# Patient Record
Sex: Female | Born: 1975 | Race: White | Hispanic: No | Smoking: Current every day smoker
Health system: Southern US, Community
[De-identification: ages and names within clinical notes are randomized; demographics above are authoritative.]

## PROBLEM LIST (undated history)

## (undated) DIAGNOSIS — G43909 Migraine, unspecified, not intractable, without status migrainosus: Secondary | ICD-10-CM

## (undated) DIAGNOSIS — E785 Hyperlipidemia, unspecified: Secondary | ICD-10-CM

## (undated) DIAGNOSIS — T8859XA Other complications of anesthesia, initial encounter: Secondary | ICD-10-CM

## (undated) DIAGNOSIS — Z8619 Personal history of other infectious and parasitic diseases: Secondary | ICD-10-CM

## (undated) DIAGNOSIS — T7840XA Allergy, unspecified, initial encounter: Secondary | ICD-10-CM

## (undated) DIAGNOSIS — F32A Depression, unspecified: Secondary | ICD-10-CM

## (undated) DIAGNOSIS — R87619 Unspecified abnormal cytological findings in specimens from cervix uteri: Secondary | ICD-10-CM

## (undated) DIAGNOSIS — Z9889 Other specified postprocedural states: Secondary | ICD-10-CM

## (undated) DIAGNOSIS — Z9289 Personal history of other medical treatment: Secondary | ICD-10-CM

## (undated) DIAGNOSIS — IMO0002 Reserved for concepts with insufficient information to code with codable children: Secondary | ICD-10-CM

## (undated) DIAGNOSIS — F419 Anxiety disorder, unspecified: Secondary | ICD-10-CM

## (undated) DIAGNOSIS — R112 Nausea with vomiting, unspecified: Secondary | ICD-10-CM

## (undated) DIAGNOSIS — R7611 Nonspecific reaction to tuberculin skin test without active tuberculosis: Secondary | ICD-10-CM

## (undated) HISTORY — DX: Personal history of other medical treatment: Z92.89

## (undated) HISTORY — DX: Hyperlipidemia, unspecified: E78.5

## (undated) HISTORY — PX: COLPOSCOPY: SHX161

## (undated) HISTORY — DX: Migraine, unspecified, not intractable, without status migrainosus: G43.909

## (undated) HISTORY — PX: TONSILLECTOMY: SUR1361

## (undated) HISTORY — DX: Allergy, unspecified, initial encounter: T78.40XA

## (undated) HISTORY — DX: Nonspecific reaction to tuberculin skin test without active tuberculosis: R76.11

## (undated) HISTORY — PX: WISDOM TOOTH EXTRACTION: SHX21

## (undated) HISTORY — DX: Personal history of other infectious and parasitic diseases: Z86.19

---
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
## 1998-10-31 HISTORY — PX: COLPOSCOPY: SHX161

## 2000-07-13 HISTORY — PX: CHOLECYSTECTOMY, LAPAROSCOPIC: SHX56

## 2002-10-31 DIAGNOSIS — R7611 Nonspecific reaction to tuberculin skin test without active tuberculosis: Secondary | ICD-10-CM

## 2002-10-31 HISTORY — DX: Nonspecific reaction to tuberculin skin test without active tuberculosis: R76.11

## 2010-09-07 ENCOUNTER — Ambulatory Visit: Payer: Self-pay | Admitting: Surgery

## 2010-09-07 ENCOUNTER — Emergency Department (HOSPITAL_COMMUNITY): Admission: EM | Admit: 2010-09-07 | Discharge: 2010-09-07 | Payer: Self-pay | Admitting: Emergency Medicine

## 2010-09-07 ENCOUNTER — Encounter (INDEPENDENT_AMBULATORY_CARE_PROVIDER_SITE_OTHER): Payer: Self-pay | Admitting: Emergency Medicine

## 2010-10-31 NOTE — L&D Delivery Note (Signed)
Delivery Note At 0235, pt's ctxs about every 5-7 minutes, but feeling more intense and more pressure.  Both her sisters were at bedside, along w/ her husband, and pt agreed to cx exam to see if should call her doula to return to hospital w/ birthing tub.  At that time, cx=3.5/100/0.  Secondary to pt's change in discomfort, doula called at that time to return to hospital w/ tub  Pt's labor continued to progress spontaneously thereafter.  FHT was WNL both when intermittently monitored, and NST's.  Pt's RN called around 0330 to report doula filling tub in preparation for pt to get in, and ok'd for pt to proceed to tub, but instructed RN to not let her stay in longer than 1 hr at a time.  At 42, pt's RN called and I was in MAU and stated, "pt feeling urge to push."  While assessing a pt in MAU, RN called back at 0432 and said, "she's crowning."  When arriving in room, pt on hands-and-knees in tub, and w/ next contraction, head crowned and delivered.  Pushed w/ 1 more ctx and body delivered and pt brought newborn to surface and to her chest.   At 4:35 AM a viable female "Ivin Booty" was delivered via Vaginal, Spontaneous Delivery (Presentation: Left Occiput Anterior).  APGAR: 8, 8; weight 7 lb 12.3 oz (3525 g).   Placenta status: Intact, Spontaneous, trailing membranes, Duncan.  Cord: 3 vessels with the following complications: None.  Cord pH: n/a.  Cord was doubly clamped (while newborn and pt still in tub) and cut by her sister to better assess newborn's temperature and color at warmer.  Pt was then helped out of tub and back to bed for placenta delivery and repair.  Anesthesia: Local~10cc  Episiotomy: None Lacerations: 2nd degree Suture Repair: 3.0 monocryl Est. Blood Loss (mL): 300  Mom to postpartum.  Baby to nursery-stable. Planning inpatient circ and breastfeed.   Danielle Cook 10/31/2011, 6:11 AM

## 2011-04-05 LAB — HEPATITIS B SURFACE ANTIGEN: Hepatitis B Surface Ag: NEGATIVE

## 2011-04-05 LAB — ABO/RH: RH Type: POSITIVE

## 2011-04-05 LAB — RPR: RPR: NONREACTIVE

## 2011-10-30 ENCOUNTER — Encounter (HOSPITAL_COMMUNITY): Payer: Self-pay | Admitting: *Deleted

## 2011-10-30 ENCOUNTER — Inpatient Hospital Stay (HOSPITAL_COMMUNITY)
Admission: AD | Admit: 2011-10-30 | Discharge: 2011-11-01 | DRG: 372 | Disposition: A | Discharge status: Home / Self Care | Payer: BC Managed Care – PPO | Source: Ambulatory Visit | Attending: Obstetrics and Gynecology | Admitting: Obstetrics and Gynecology

## 2011-10-30 DIAGNOSIS — Z22330 Carrier of Group B streptococcus: Secondary | ICD-10-CM

## 2011-10-30 DIAGNOSIS — O99892 Other specified diseases and conditions complicating childbirth: Secondary | ICD-10-CM | POA: Diagnosis present

## 2011-10-30 DIAGNOSIS — O09529 Supervision of elderly multigravida, unspecified trimester: Secondary | ICD-10-CM | POA: Diagnosis present

## 2011-10-30 DIAGNOSIS — O4290 Premature rupture of membranes, unspecified as to length of time between rupture and onset of labor, unspecified weeks of gestation: Principal | ICD-10-CM | POA: Diagnosis present

## 2011-10-30 DIAGNOSIS — IMO0002 Reserved for concepts with insufficient information to code with codable children: Secondary | ICD-10-CM | POA: Diagnosis present

## 2011-10-30 DIAGNOSIS — O99820 Streptococcus B carrier state complicating pregnancy: Secondary | ICD-10-CM

## 2011-10-30 DIAGNOSIS — F418 Other specified anxiety disorders: Secondary | ICD-10-CM | POA: Diagnosis not present

## 2011-10-30 HISTORY — DX: Reserved for concepts with insufficient information to code with codable children: IMO0002

## 2011-10-30 HISTORY — DX: Unspecified abnormal cytological findings in specimens from cervix uteri: R87.619

## 2011-10-30 LAB — COMPREHENSIVE METABOLIC PANEL
Albumin: 2.6 g/dL — ABNORMAL LOW (ref 3.5–5.2)
BUN: 8 mg/dL (ref 6–23)
Calcium: 9.1 mg/dL (ref 8.4–10.5)
Chloride: 103 mEq/L (ref 96–112)
Creatinine, Ser: 0.51 mg/dL (ref 0.50–1.10)
GFR calc non Af Amer: >90 mL/min (ref >90)
Total Bilirubin: 0.2 mg/dL — ABNORMAL LOW (ref 0.3–1.2)

## 2011-10-30 LAB — CBC
Hemoglobin: 13.9 g/dL (ref 12.0–15.0)
MCHC: 34.4 g/dL (ref 30.0–36.0)
RBC: 4.43 MIL/uL (ref 3.87–5.11)
WBC: 10.7 10*3/uL — ABNORMAL HIGH (ref 4.0–10.5)

## 2011-10-30 LAB — LACTATE DEHYDROGENASE: LDH: 158 U/L (ref 94–250)

## 2011-10-30 LAB — URIC ACID: Uric Acid, Serum: 4.9 mg/dL (ref 2.4–7.0)

## 2011-10-30 MED ORDER — SODIUM CHLORIDE 0.9 % IV SOLN: 250.0000 mL | INTRAVENOUS | Status: DC | PRN

## 2011-10-30 MED ORDER — HYDROXYZINE HCL 50 MG PO TABS: 50.0000 mg | ORAL_TABLET | Freq: Four times a day (QID) | ORAL | Status: DC | PRN

## 2011-10-30 MED ORDER — PENICILLIN G POTASSIUM 5000000 UNITS IJ SOLR
5.0000 10*6.[IU] | Freq: Once | INTRAVENOUS | Status: AC
  Administered 2011-10-30: 5 10*6.[IU] via INTRAVENOUS
  Filled 2011-10-30: qty 5

## 2011-10-30 MED ORDER — ACETAMINOPHEN 325 MG PO TABS: 650.0000 mg | ORAL_TABLET | ORAL | Status: DC | PRN

## 2011-10-30 MED ORDER — CITRIC ACID-SODIUM CITRATE 334-500 MG/5ML PO SOLN: 30.0000 mL | ORAL | Status: DC | PRN

## 2011-10-30 MED ORDER — SODIUM CHLORIDE 0.9 % IJ SOLN: 3.0000 mL | INTRAMUSCULAR | Status: DC | PRN

## 2011-10-30 MED ORDER — ZOLPIDEM TARTRATE 10 MG PO TABS: 10.0000 mg | ORAL_TABLET | Freq: Every evening | ORAL | Status: DC | PRN

## 2011-10-30 MED ORDER — SODIUM CHLORIDE 0.9 % IJ SOLN: 3.0000 mL | Freq: Two times a day (BID) | INTRAMUSCULAR | Status: DC

## 2011-10-30 MED ORDER — OXYTOCIN BOLUS FROM INFUSION
500.0000 mL | Freq: Once | INTRAVENOUS | Status: DC
  Filled 2011-10-30: qty 500

## 2011-10-30 MED ORDER — LIDOCAINE HCL (PF) 1 % IJ SOLN
30.0000 mL | INTRAMUSCULAR | Status: DC | PRN
  Administered 2011-10-31: 30 mL via SUBCUTANEOUS
  Filled 2011-10-30: qty 30

## 2011-10-30 MED ORDER — PENICILLIN G POTASSIUM 5000000 UNITS IJ SOLR
2.5000 10*6.[IU] | INTRAMUSCULAR | Status: DC
  Administered 2011-10-31: 2.5 10*6.[IU] via INTRAVENOUS
  Filled 2011-10-30 (×5): qty 2.5

## 2011-10-30 MED ORDER — HYDROXYZINE HCL 50 MG/ML IM SOLN: 50.0000 mg | Freq: Four times a day (QID) | INTRAMUSCULAR | Status: DC | PRN

## 2011-10-30 MED ORDER — IBUPROFEN 600 MG PO TABS
600.0000 mg | ORAL_TABLET | Freq: Four times a day (QID) | ORAL | Status: DC | PRN
  Administered 2011-10-31: 600 mg via ORAL
  Filled 2011-10-30 (×3): qty 1

## 2011-10-30 MED ORDER — FLEET ENEMA 7-19 GM/118ML RE ENEM: 1.0000 | ENEMA | RECTAL | Status: DC | PRN

## 2011-10-30 MED ORDER — OXYCODONE-ACETAMINOPHEN 5-325 MG PO TABS: 2.0000 | ORAL_TABLET | ORAL | Status: DC | PRN

## 2011-10-30 MED ORDER — OXYTOCIN 20 UNITS IN LACTATED RINGERS INFUSION - SIMPLE
125.0000 mL/h | Freq: Once | INTRAVENOUS | Status: DC
  Filled 2011-10-30: qty 1000

## 2011-10-30 MED ORDER — BUTORPHANOL TARTRATE 2 MG/ML IJ SOLN: 1.0000 mg | INTRAMUSCULAR | Status: DC | PRN

## 2011-10-30 MED ORDER — ONDANSETRON HCL 4 MG/2ML IJ SOLN: 4.0000 mg | Freq: Four times a day (QID) | INTRAMUSCULAR | Status: DC | PRN

## 2011-10-30 MED ORDER — LACTATED RINGERS IV SOLN: 500.0000 mL | INTRAVENOUS | Status: DC | PRN
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
## 2011-10-30 NOTE — Progress Notes (Signed)
Pt states her water broke at 1800-note leaking clear fluid-H.Steelman,CNM in and evaulated fluid leaking-to admit

## 2011-10-31 ENCOUNTER — Encounter (HOSPITAL_COMMUNITY): Payer: Self-pay | Admitting: Obstetrics

## 2011-10-31 DIAGNOSIS — IMO0002 Reserved for concepts with insufficient information to code with codable children: Secondary | ICD-10-CM | POA: Diagnosis present

## 2011-10-31 DIAGNOSIS — O09529 Supervision of elderly multigravida, unspecified trimester: Secondary | ICD-10-CM | POA: Insufficient documentation

## 2011-10-31 DIAGNOSIS — O99820 Streptococcus B carrier state complicating pregnancy: Secondary | ICD-10-CM

## 2011-10-31 DIAGNOSIS — F418 Other specified anxiety disorders: Secondary | ICD-10-CM | POA: Diagnosis not present

## 2011-10-31 LAB — RPR: RPR Ser Ql: NONREACTIVE

## 2011-10-31 MED ORDER — IBUPROFEN 600 MG PO TABS
600.0000 mg | ORAL_TABLET | Freq: Four times a day (QID) | ORAL | Status: DC
  Administered 2011-10-31 – 2011-11-01 (×3): 600 mg via ORAL
  Filled 2011-10-31 (×2): qty 1

## 2011-10-31 MED ORDER — ESCITALOPRAM OXALATE 10 MG PO TABS
5.0000 mg | ORAL_TABLET | Freq: Every day | ORAL | Status: DC
  Administered 2011-10-31: 5 mg via ORAL
  Filled 2011-10-31 (×2): qty 0.5

## 2011-10-31 MED ORDER — OXYCODONE-ACETAMINOPHEN 5-325 MG PO TABS
1.0000 | ORAL_TABLET | ORAL | Status: DC | PRN
  Filled 2011-10-31: qty 1

## 2011-10-31 MED ORDER — SENNOSIDES-DOCUSATE SODIUM 8.6-50 MG PO TABS: 2.0000 | ORAL_TABLET | Freq: Every day | ORAL | Status: DC

## 2011-10-31 MED ORDER — MEDROXYPROGESTERONE ACETATE 150 MG/ML IM SUSP: 150.0000 mg | INTRAMUSCULAR | Status: DC | PRN

## 2011-10-31 MED ORDER — ONDANSETRON HCL 4 MG PO TABS: 4.0000 mg | ORAL_TABLET | ORAL | Status: DC | PRN

## 2011-10-31 MED ORDER — ZOLPIDEM TARTRATE 5 MG PO TABS: 5.0000 mg | ORAL_TABLET | Freq: Every evening | ORAL | Status: DC | PRN

## 2011-10-31 MED ORDER — WITCH HAZEL-GLYCERIN EX PADS: 1.0000 "application " | MEDICATED_PAD | CUTANEOUS | Status: DC | PRN

## 2011-10-31 MED ORDER — TETANUS-DIPHTH-ACELL PERTUSSIS 5-2.5-18.5 LF-MCG/0.5 IM SUSP: 0.5000 mL | Freq: Once | INTRAMUSCULAR | Status: DC

## 2011-10-31 MED ORDER — PRENATAL MULTIVITAMIN CH: 1.0000 | ORAL_TABLET | Freq: Every day | ORAL | Status: DC

## 2011-10-31 MED ORDER — DIPHENHYDRAMINE HCL 25 MG PO CAPS: 25.0000 mg | ORAL_CAPSULE | Freq: Four times a day (QID) | ORAL | Status: DC | PRN

## 2011-10-31 MED ORDER — LORATADINE 10 MG PO TABS
10.0000 mg | ORAL_TABLET | Freq: Every day | ORAL | Status: DC
  Administered 2011-10-31: 10 mg via ORAL
  Filled 2011-10-31 (×2): qty 1

## 2011-10-31 MED ORDER — ONDANSETRON HCL 4 MG/2ML IJ SOLN: 4.0000 mg | INTRAMUSCULAR | Status: DC | PRN

## 2011-10-31 MED ORDER — BENZOCAINE-MENTHOL 20-0.5 % EX AERO: 1.0000 "application " | INHALATION_SPRAY | CUTANEOUS | Status: DC | PRN

## 2011-10-31 MED ORDER — METHYLERGONOVINE MALEATE 0.2 MG/ML IJ SOLN: 0.2000 mg | INTRAMUSCULAR | Status: DC | PRN

## 2011-10-31 MED ORDER — PRENATAL MULTIVITAMIN CH
1.0000 | ORAL_TABLET | Freq: Every day | ORAL | Status: DC
  Administered 2011-10-31: 1 via ORAL
  Filled 2011-10-31: qty 1

## 2011-10-31 MED ORDER — DIBUCAINE 1 % RE OINT: 1.0000 "application " | TOPICAL_OINTMENT | RECTAL | Status: DC | PRN

## 2011-10-31 MED ORDER — LANOLIN HYDROUS EX OINT: TOPICAL_OINTMENT | CUTANEOUS | Status: DC | PRN

## 2011-10-31 MED ORDER — METHYLERGONOVINE MALEATE 0.2 MG PO TABS: 0.2000 mg | ORAL_TABLET | ORAL | Status: DC | PRN

## 2011-10-31 MED ORDER — SIMETHICONE 80 MG PO CHEW: 80.0000 mg | CHEWABLE_TABLET | ORAL | Status: DC | PRN

## 2011-10-31 MED ORDER — MAGNESIUM HYDROXIDE 400 MG/5ML PO SUSP: 30.0000 mL | ORAL | Status: DC | PRN

## 2011-11-01 LAB — CBC
HCT: 34.1 % — ABNORMAL LOW (ref 36.0–46.0)
Hemoglobin: 11.4 g/dL — ABNORMAL LOW (ref 12.0–15.0)
MCH: 30.8 pg (ref 26.0–34.0)
MCHC: 33.4 g/dL (ref 30.0–36.0)

## 2011-11-01 MED ORDER — IBUPROFEN 600 MG PO TABS: 600.0000 mg | ORAL_TABLET | Freq: Four times a day (QID) | ORAL | Status: AC | PRN

## 2011-11-01 NOTE — Discharge Summary (Signed)
Physician Discharge Summary  Patient ID: Danielle Cook MRN: [id number] DOB/AGE: 1976-01-28 35 y.o.  Admit date: 10/30/2011 Discharge date: 11/01/2011  Admission Diagnoses: term pg labor  Discharge Diagnoses:  Active Problems:  NSVD (normal spontaneous vaginal delivery)  Anxiety   Discharged Condition: stable  Hospital Course: presented in labor, SVD, normal involution HGB 11.4 Consults: none  Significant Diagnostic Studies: labs: S: breastfeeding going well, plan husband vasectomy Discharge Exam: Blood pressure 112/74, pulse 88, temperature 98.3 F (36.8 C), temperature source Oral, resp. rate 18, height 5\' 4"  (1.626 m), weight 223 lb (101.152 kg), SpO2 98.00%, unknown if currently breastfeeding. P abd soft, gravid, nt, ff sm serosa flow, perineum clean intact, perineal lac well approximated nonreactive, -Homans sign bilaterally, +1 edema lower legs  Disposition: Final discharge disposition not confirmed  Discharge Orders    Future Orders Please Complete By Expires   Strep B DNA probe      Comments:   This external order was created through the Results Console.   HIV antibody      Comments:   This external order was created through the Results Console.   Rubella antibody, IgM      Comments:   This external order was created through the Results Console.   Hepatitis B surface antigen      Comments:   This external order was created through the Results Console.   RPR      Comments:   This external order was created through the Results Console.   Antibody screen      Comments:   This external order was created through the Results Console.   ABO/Rh      Comments:   This external order was created through the Results Console.     Medication List  As of 11/01/2011  9:10 AM   START taking these medications         ibuprofen 600 MG tablet   Commonly known as: ADVIL,MOTRIN   Take 1 tablet (600 mg total) by mouth every 6 (six) hours as needed for pain (pain scale <  4).         CONTINUE taking these medications         escitalopram 5 MG tablet   Commonly known as: LEXAPRO      loratadine 10 MG tablet   Commonly known as: CLARITIN      omeprazole 20 MG capsule   Commonly known as: PRILOSEC      prenatal multivitamin Tabs         STOP taking these medications         EVENING PRIMROSE OIL PO          Where to get your medications    These are the prescriptions that you need to pick up.   You may get these medications from any pharmacy.         ibuprofen 600 MG tablet           Follow-up Information    Follow up with [facility] in 6 weeks.         SignedLavera Guise 11/01/2011, 9:10 AM

## 2011-11-08 ENCOUNTER — Inpatient Hospital Stay (HOSPITAL_COMMUNITY)
Admission: AD | Admit: 2011-11-08 | Payer: BC Managed Care – PPO | Source: Ambulatory Visit | Admitting: Obstetrics and Gynecology

## 2013-03-22 ENCOUNTER — Ambulatory Visit (INDEPENDENT_AMBULATORY_CARE_PROVIDER_SITE_OTHER): Payer: BC Managed Care – PPO | Admitting: Internal Medicine

## 2013-03-22 ENCOUNTER — Other Ambulatory Visit (INDEPENDENT_AMBULATORY_CARE_PROVIDER_SITE_OTHER): Payer: BC Managed Care – PPO

## 2013-03-22 ENCOUNTER — Encounter: Payer: Self-pay | Admitting: Internal Medicine

## 2013-03-22 ENCOUNTER — Ambulatory Visit (INDEPENDENT_AMBULATORY_CARE_PROVIDER_SITE_OTHER)
Admission: RE | Admit: 2013-03-22 | Discharge: 2013-03-22 | Disposition: A | Discharge status: Home / Self Care | Payer: BC Managed Care – PPO | Source: Ambulatory Visit | Attending: Internal Medicine | Admitting: Internal Medicine

## 2013-03-22 VITALS — BP 102/70 | HR 72 | Temp 98.5°F | Resp 16 | Ht 63.75 in | Wt 193.4 lb

## 2013-03-22 DIAGNOSIS — Z0000 Encounter for general adult medical examination without abnormal findings: Secondary | ICD-10-CM

## 2013-03-22 DIAGNOSIS — J309 Allergic rhinitis, unspecified: Secondary | ICD-10-CM | POA: Insufficient documentation

## 2013-03-22 DIAGNOSIS — R7611 Nonspecific reaction to tuberculin skin test without active tuberculosis: Secondary | ICD-10-CM | POA: Insufficient documentation

## 2013-03-22 DIAGNOSIS — F419 Anxiety disorder, unspecified: Secondary | ICD-10-CM

## 2013-03-22 LAB — CBC WITH DIFFERENTIAL/PLATELET
Basophils Relative: 1 % (ref 0.0–3.0)
Eosinophils Absolute: 0.4 10*3/uL (ref 0.0–0.7)
Eosinophils Relative: 6.1 % — ABNORMAL HIGH (ref 0.0–5.0)
Lymphocytes Relative: 31.4 % (ref 12.0–46.0)
Monocytes Relative: 6.7 % (ref 3.0–12.0)
Neutrophils Relative %: 54.8 % (ref 43.0–77.0)
RBC: 4.58 Mil/uL (ref 3.87–5.11)
WBC: 6.7 10*3/uL (ref 4.5–10.5)

## 2013-03-22 LAB — URINALYSIS, ROUTINE W REFLEX MICROSCOPIC
Bilirubin Urine: NEGATIVE
Nitrite: NEGATIVE
Total Protein, Urine: NEGATIVE

## 2013-03-22 LAB — COMPREHENSIVE METABOLIC PANEL
AST: 22 U/L (ref 0–37)
Albumin: 3.8 g/dL (ref 3.5–5.2)
BUN: 10 mg/dL (ref 6–23)
CO2: 29 mEq/L (ref 19–32)
Calcium: 9 mg/dL (ref 8.4–10.5)
Chloride: 105 mEq/L (ref 96–112)
Glucose, Bld: 83 mg/dL (ref 70–99)
Potassium: 4.1 mEq/L (ref 3.5–5.1)

## 2013-03-22 LAB — LIPID PANEL
LDL Cholesterol: 118 mg/dL — ABNORMAL HIGH (ref 0–99)
Total CHOL/HDL Ratio: 4
VLDL: 17.8 mg/dL (ref 0.0–40.0)

## 2013-03-22 MED ORDER — ESCITALOPRAM OXALATE 5 MG PO TABS: 5.0000 mg | ORAL_TABLET | Freq: Every day | ORAL | Status: DC

## 2013-03-22 NOTE — Patient Instructions (Signed)
Preventive Care for Adults, Female A healthy lifestyle and preventive care can promote health and wellness. Preventive health guidelines for women include the following key practices.  A routine yearly physical is a good way to check with your caregiver about your health and preventive screening. It is a chance to share any concerns and updates on your health, and to receive a thorough exam.  Visit your dentist for a routine exam and preventive care every 6 months. Brush your teeth twice a day and floss once a day. Good oral hygiene prevents tooth decay and gum disease.  The frequency of eye exams is based on your age, health, family medical history, use of contact lenses, and other factors. Follow your caregiver's recommendations for frequency of eye exams.  Eat a healthy diet. Foods like vegetables, fruits, whole grains, low-fat dairy products, and lean protein foods contain the nutrients you need without too many calories. Decrease your intake of foods high in solid fats, added sugars, and salt. Eat the right amount of calories for you.Get information about a proper diet from your caregiver, if necessary.  Regular physical exercise is one of the most important things you can do for your health. Most adults should get at least 150 minutes of moderate-intensity exercise (any activity that increases your heart rate and causes you to sweat) each week. In addition, most adults need muscle-strengthening exercises on 2 or more days a week.  Maintain a healthy weight. The body mass index (BMI) is a screening tool to identify possible weight problems. It provides an estimate of body fat based on height and weight. Your caregiver can help determine your BMI, and can help you achieve or maintain a healthy weight.For adults 20 years and older:  A BMI below 18.5 is considered underweight.  A BMI of 18.5 to 24.9 is normal.  A BMI of 25 to 29.9 is considered overweight.  A BMI of 30 and above is  considered obese.  Maintain normal blood lipids and cholesterol levels by exercising and minimizing your intake of saturated fat. Eat a balanced diet with plenty of fruit and vegetables. Blood tests for lipids and cholesterol should begin at age 20 and be repeated every 5 years. If your lipid or cholesterol levels are high, you are over 50, or you are at high risk for heart disease, you may need your cholesterol levels checked more frequently.Ongoing high lipid and cholesterol levels should be treated with medicines if diet and exercise are not effective.  If you smoke, find out from your caregiver how to quit. If you do not use tobacco, do not start.  If you are pregnant, do not drink alcohol. If you are breastfeeding, be very cautious about drinking alcohol. If you are not pregnant and choose to drink alcohol, do not exceed 1 drink per day. One drink is considered to be 12 ounces (355 mL) of beer, 5 ounces (148 mL) of wine, or 1.5 ounces (44 mL) of liquor.  Avoid use of street drugs. Do not share needles with anyone. Ask for help if you need support or instructions about stopping the use of drugs.  High blood pressure causes heart disease and increases the risk of stroke. Your blood pressure should be checked at least every 1 to 2 years. Ongoing high blood pressure should be treated with medicines if weight loss and exercise are not effective.  If you are 55 to 37 years old, ask your caregiver if you should take aspirin to prevent strokes.  Diabetes   screening involves taking a blood sample to check your fasting blood sugar level. This should be done once every 3 years, after age 45, if you are within normal weight and without risk factors for diabetes. Testing should be considered at a younger age or be carried out more frequently if you are overweight and have at least 1 risk factor for diabetes.  Breast cancer screening is essential preventive care for women. You should practice "breast  self-awareness." This means understanding the normal appearance and feel of your breasts and may include breast self-examination. Any changes detected, no matter how small, should be reported to a caregiver. Women in their 20s and 30s should have a clinical breast exam (CBE) by a caregiver as part of a regular health exam every 1 to 3 years. After age 40, women should have a CBE every year. Starting at age 40, women should consider having a mammography (breast X-ray test) every year. Women who have a family history of breast cancer should talk to their caregiver about genetic screening. Women at a high risk of breast cancer should talk to their caregivers about having magnetic resonance imaging (MRI) and a mammography every year.  The Pap test is a screening test for cervical cancer. A Pap test can show cell changes on the cervix that might become cervical cancer if left untreated. A Pap test is a procedure in which cells are obtained and examined from the lower end of the uterus (cervix).  Women should have a Pap test starting at age 21.  Between ages 21 and 29, Pap tests should be repeated every 2 years.  Beginning at age 30, you should have a Pap test every 3 years as long as the past 3 Pap tests have been normal.  Some women have medical problems that increase the chance of getting cervical cancer. Talk to your caregiver about these problems. It is especially important to talk to your caregiver if a new problem develops soon after your last Pap test. In these cases, your caregiver may recommend more frequent screening and Pap tests.  The above recommendations are the same for women who have or have not gotten the vaccine for human papillomavirus (HPV).  If you had a hysterectomy for a problem that was not cancer or a condition that could lead to cancer, then you no longer need Pap tests. Even if you no longer need a Pap test, a regular exam is a good idea to make sure no other problems are  starting.  If you are between ages 65 and 70, and you have had normal Pap tests going back 10 years, you no longer need Pap tests. Even if you no longer need a Pap test, a regular exam is a good idea to make sure no other problems are starting.  If you have had past treatment for cervical cancer or a condition that could lead to cancer, you need Pap tests and screening for cancer for at least 20 years after your treatment.  If Pap tests have been discontinued, risk factors (such as a new sexual partner) need to be reassessed to determine if screening should be resumed.  The HPV test is an additional test that may be used for cervical cancer screening. The HPV test looks for the virus that can cause the cell changes on the cervix. The cells collected during the Pap test can be tested for HPV. The HPV test could be used to screen women aged 30 years and older, and should   be used in women of any age who have unclear Pap test results. After the age of 30, women should have HPV testing at the same frequency as a Pap test.  Colorectal cancer can be detected and often prevented. Most routine colorectal cancer screening begins at the age of 50 and continues through age 75. However, your caregiver may recommend screening at an earlier age if you have risk factors for colon cancer. On a yearly basis, your caregiver may provide home test kits to check for hidden blood in the stool. Use of a small camera at the end of a tube, to directly examine the colon (sigmoidoscopy or colonoscopy), can detect the earliest forms of colorectal cancer. Talk to your caregiver about this at age 50, when routine screening begins. Direct examination of the colon should be repeated every 5 to 10 years through age 75, unless early forms of pre-cancerous polyps or small growths are found.  Hepatitis C blood testing is recommended for all people born from 1945 through 1965 and any individual with known risks for hepatitis C.  Practice  safe sex. Use condoms and avoid high-risk sexual practices to reduce the spread of sexually transmitted infections (STIs). STIs include gonorrhea, chlamydia, syphilis, trichomonas, herpes, HPV, and human immunodeficiency virus (HIV). Herpes, HIV, and HPV are viral illnesses that have no cure. They can result in disability, cancer, and death. Sexually active women aged 25 and younger should be checked for chlamydia. Older women with new or multiple partners should also be tested for chlamydia. Testing for other STIs is recommended if you are sexually active and at increased risk.  Osteoporosis is a disease in which the bones lose minerals and strength with aging. This can result in serious bone fractures. The risk of osteoporosis can be identified using a bone density scan. Women ages 65 and over and women at risk for fractures or osteoporosis should discuss screening with their caregivers. Ask your caregiver whether you should take a calcium supplement or vitamin D to reduce the rate of osteoporosis.  Menopause can be associated with physical symptoms and risks. Hormone replacement therapy is available to decrease symptoms and risks. You should talk to your caregiver about whether hormone replacement therapy is right for you.  Use sunscreen with sun protection factor (SPF) of 30 or more. Apply sunscreen liberally and repeatedly throughout the day. You should seek shade when your shadow is shorter than you. Protect yourself by wearing long sleeves, pants, a wide-brimmed hat, and sunglasses year round, whenever you are outdoors.  Once a month, do a whole body skin exam, using a mirror to look at the skin on your back. Notify your caregiver of new moles, moles that have irregular borders, moles that are larger than a pencil eraser, or moles that have changed in shape or color.  Stay current with required immunizations.  Influenza. You need a dose every fall (or winter). The composition of the flu vaccine  changes each year, so being vaccinated once is not enough.  Pneumococcal polysaccharide. You need 1 to 2 doses if you smoke cigarettes or if you have certain chronic medical conditions. You need 1 dose at age 65 (or older) if you have never been vaccinated.  Tetanus, diphtheria, pertussis (Tdap, Td). Get 1 dose of Tdap vaccine if you are younger than age 65, are over 65 and have contact with an infant, are a healthcare worker, are pregnant, or simply want to be protected from whooping cough. After that, you need a Td   booster dose every 10 years. Consult your caregiver if you have not had at least 3 tetanus and diphtheria-containing shots sometime in your life or have a deep or dirty wound.  HPV. You need this vaccine if you are a woman age 26 or younger. The vaccine is given in 3 doses over 6 months.  Measles, mumps, rubella (MMR). You need at least 1 dose of MMR if you were born in 1957 or later. You may also need a second dose.  Meningococcal. If you are age 19 to 21 and a first-year college student living in a residence hall, or have one of several medical conditions, you need to get vaccinated against meningococcal disease. You may also need additional booster doses.  Zoster (shingles). If you are age 60 or older, you should get this vaccine.  Varicella (chickenpox). If you have never had chickenpox or you were vaccinated but received only 1 dose, talk to your caregiver to find out if you need this vaccine.  Hepatitis A. You need this vaccine if you have a specific risk factor for hepatitis A virus infection or you simply wish to be protected from this disease. The vaccine is usually given as 2 doses, 6 to 18 months apart.  Hepatitis B. You need this vaccine if you have a specific risk factor for hepatitis B virus infection or you simply wish to be protected from this disease. The vaccine is given in 3 doses, usually over 6 months. Preventive Services / Frequency Ages 19 to 39  Blood  pressure check.** / Every 1 to 2 years.  Lipid and cholesterol check.** / Every 5 years beginning at age 20.  Clinical breast exam.** / Every 3 years for women in their 20s and 30s.  Pap test.** / Every 2 years from ages 21 through 29. Every 3 years starting at age 30 through age 65 or 70 with a history of 3 consecutive normal Pap tests.  HPV screening.** / Every 3 years from ages 30 through ages 65 to 70 with a history of 3 consecutive normal Pap tests.  Hepatitis C blood test.** / For any individual with known risks for hepatitis C.  Skin self-exam. / Monthly.  Influenza immunization.** / Every year.  Pneumococcal polysaccharide immunization.** / 1 to 2 doses if you smoke cigarettes or if you have certain chronic medical conditions.  Tetanus, diphtheria, pertussis (Tdap, Td) immunization. / A one-time dose of Tdap vaccine. After that, you need a Td booster dose every 10 years.  HPV immunization. / 3 doses over 6 months, if you are 26 and younger.  Measles, mumps, rubella (MMR) immunization. / You need at least 1 dose of MMR if you were born in 1957 or later. You may also need a second dose.  Meningococcal immunization. / 1 dose if you are age 19 to 21 and a first-year college student living in a residence hall, or have one of several medical conditions, you need to get vaccinated against meningococcal disease. You may also need additional booster doses.  Varicella immunization.** / Consult your caregiver.  Hepatitis A immunization.** / Consult your caregiver. 2 doses, 6 to 18 months apart.  Hepatitis B immunization.** / Consult your caregiver. 3 doses usually over 6 months. Ages 40 to 64  Blood pressure check.** / Every 1 to 2 years.  Lipid and cholesterol check.** / Every 5 years beginning at age 20.  Clinical breast exam.** / Every year after age 40.  Mammogram.** / Every year beginning at age 40   and continuing for as long as you are in good health. Consult with your  caregiver.  Pap test.** / Every 3 years starting at age 30 through age 65 or 70 with a history of 3 consecutive normal Pap tests.  HPV screening.** / Every 3 years from ages 30 through ages 65 to 70 with a history of 3 consecutive normal Pap tests.  Fecal occult blood test (FOBT) of stool. / Every year beginning at age 50 and continuing until age 75. You may not need to do this test if you get a colonoscopy every 10 years.  Flexible sigmoidoscopy or colonoscopy.** / Every 5 years for a flexible sigmoidoscopy or every 10 years for a colonoscopy beginning at age 50 and continuing until age 75.  Hepatitis C blood test.** / For all people born from 1945 through 1965 and any individual with known risks for hepatitis C.  Skin self-exam. / Monthly.  Influenza immunization.** / Every year.  Pneumococcal polysaccharide immunization.** / 1 to 2 doses if you smoke cigarettes or if you have certain chronic medical conditions.  Tetanus, diphtheria, pertussis (Tdap, Td) immunization.** / A one-time dose of Tdap vaccine. After that, you need a Td booster dose every 10 years.  Measles, mumps, rubella (MMR) immunization. / You need at least 1 dose of MMR if you were born in 1957 or later. You may also need a second dose.  Varicella immunization.** / Consult your caregiver.  Meningococcal immunization.** / Consult your caregiver.  Hepatitis A immunization.** / Consult your caregiver. 2 doses, 6 to 18 months apart.  Hepatitis B immunization.** / Consult your caregiver. 3 doses, usually over 6 months. Ages 65 and over  Blood pressure check.** / Every 1 to 2 years.  Lipid and cholesterol check.** / Every 5 years beginning at age 20.  Clinical breast exam.** / Every year after age 40.  Mammogram.** / Every year beginning at age 40 and continuing for as long as you are in good health. Consult with your caregiver.  Pap test.** / Every 3 years starting at age 30 through age 65 or 70 with a 3  consecutive normal Pap tests. Testing can be stopped between 65 and 70 with 3 consecutive normal Pap tests and no abnormal Pap or HPV tests in the past 10 years.  HPV screening.** / Every 3 years from ages 30 through ages 65 or 70 with a history of 3 consecutive normal Pap tests. Testing can be stopped between 65 and 70 with 3 consecutive normal Pap tests and no abnormal Pap or HPV tests in the past 10 years.  Fecal occult blood test (FOBT) of stool. / Every year beginning at age 50 and continuing until age 75. You may not need to do this test if you get a colonoscopy every 10 years.  Flexible sigmoidoscopy or colonoscopy.** / Every 5 years for a flexible sigmoidoscopy or every 10 years for a colonoscopy beginning at age 50 and continuing until age 75.  Hepatitis C blood test.** / For all people born from 1945 through 1965 and any individual with known risks for hepatitis C.  Osteoporosis screening.** / A one-time screening for women ages 65 and over and women at risk for fractures or osteoporosis.  Skin self-exam. / Monthly.  Influenza immunization.** / Every year.  Pneumococcal polysaccharide immunization.** / 1 dose at age 65 (or older) if you have never been vaccinated.  Tetanus, diphtheria, pertussis (Tdap, Td) immunization. / A one-time dose of Tdap vaccine if you are over   65 and have contact with an infant, are a healthcare worker, or simply want to be protected from whooping cough. After that, you need a Td booster dose every 10 years.  Varicella immunization.** / Consult your caregiver.  Meningococcal immunization.** / Consult your caregiver.  Hepatitis A immunization.** / Consult your caregiver. 2 doses, 6 to 18 months apart.  Hepatitis B immunization.** / Check with your caregiver. 3 doses, usually over 6 months. ** Family history and personal history of risk and conditions may change your caregiver's recommendations. Document Released: 12/13/2001 Document Revised: 01/09/2012  Document Reviewed: 03/14/2011 [employer] Patient Information 2014 [employer].  

## 2013-03-22 NOTE — Assessment & Plan Note (Signed)
Continue lexapro  ?

## 2013-03-22 NOTE — Assessment & Plan Note (Signed)
Exam done Vaccines were reviewed Labs ordered No PAP was done today at her request Pt. ed material was given Form completed

## 2013-03-22 NOTE — Progress Notes (Signed)
  Subjective:    Patient ID: Danielle Cook, female    DOB: Apr 17, 1976, 37 y.o.   MRN: [id number]  HPI  New to me for a physical, she needs documentation to enter nursing school.  Review of Systems  Constitutional: Negative for fever, chills, diaphoresis, fatigue and unexpected weight change.  HENT: Positive for congestion, rhinorrhea, sneezing, postnasal drip and sinus pressure. Negative for nosebleeds, sore throat, facial swelling, trouble swallowing, voice change and tinnitus.   Eyes: Negative.   Respiratory: Negative.  Negative for cough, shortness of breath, wheezing and stridor.   Cardiovascular: Negative.   Gastrointestinal: Negative for abdominal pain.  Endocrine: Negative.   Genitourinary: Negative.   Musculoskeletal: Negative.   Skin: Negative.   Allergic/Immunologic: Negative.   Neurological: Negative.   Hematological: Negative for adenopathy. Does not bruise/bleed easily.  Psychiatric/Behavioral: Negative.        Objective:   Physical Exam  Vitals reviewed. Constitutional: She is oriented to person, place, and time. She appears well-developed and well-nourished.  Non-toxic appearance. She does not have a sickly appearance. She does not appear ill. No distress.  HENT:  Head: Normocephalic and atraumatic. No trismus in the jaw.  Nose: Mucosal edema and rhinorrhea present. No nose lacerations, sinus tenderness, nasal deformity, septal deviation or nasal septal hematoma. No epistaxis.  No foreign bodies. Right sinus exhibits no maxillary sinus tenderness and no frontal sinus tenderness. Left sinus exhibits no maxillary sinus tenderness and no frontal sinus tenderness.  Mouth/Throat: Oropharynx is clear and moist. Mucous membranes are not pale, not dry and not cyanotic. No oral lesions. No edematous. No oropharyngeal exudate, posterior oropharyngeal edema, posterior oropharyngeal erythema or tonsillar abscesses.  Eyes: Conjunctivae are normal. Right eye exhibits no  discharge. Left eye exhibits no discharge. No scleral icterus.  Neck: Normal range of motion. Neck supple. No JVD present. No tracheal deviation present. No thyromegaly present.  Cardiovascular: Normal rate, regular rhythm, normal heart sounds and intact distal pulses.  Exam reveals no gallop and no friction rub.   No murmur heard. Pulmonary/Chest: Effort normal and breath sounds normal. No stridor. No respiratory distress. She has no wheezes. She has no rales. She exhibits no tenderness.  Abdominal: Soft. Bowel sounds are normal. She exhibits no distension and no mass. There is no tenderness. There is no rebound and no guarding.  Musculoskeletal: Normal range of motion. She exhibits no edema and no tenderness.  Lymphadenopathy:    She has no cervical adenopathy.  Neurological: She is oriented to person, place, and time.  Skin: Skin is warm and dry. No rash noted. She is not diaphoretic. No erythema. No pallor.  Psychiatric: She has a normal mood and affect. Judgment and thought content normal.          Assessment & Plan:

## 2013-03-22 NOTE — Assessment & Plan Note (Signed)
CXR today.  

## 2013-03-26 ENCOUNTER — Encounter: Payer: Self-pay | Admitting: Internal Medicine

## 2013-03-26 LAB — VARICELLA ZOSTER ANTIBODY, IGG: Varicella IgG: 1146 Index — ABNORMAL HIGH (ref <135.00)

## 2014-03-17 ENCOUNTER — Other Ambulatory Visit: Payer: Self-pay | Admitting: Internal Medicine

## 2014-07-09 ENCOUNTER — Other Ambulatory Visit: Payer: Self-pay | Admitting: Internal Medicine

## 2014-07-23 ENCOUNTER — Encounter: Payer: BC Managed Care – PPO | Admitting: Internal Medicine

## 2014-08-15 ENCOUNTER — Encounter: Payer: Self-pay | Admitting: Internal Medicine

## 2014-08-15 ENCOUNTER — Other Ambulatory Visit (INDEPENDENT_AMBULATORY_CARE_PROVIDER_SITE_OTHER): Payer: BC Managed Care – PPO

## 2014-08-15 ENCOUNTER — Other Ambulatory Visit (HOSPITAL_COMMUNITY)
Admission: RE | Admit: 2014-08-15 | Discharge: 2014-08-15 | Disposition: A | Discharge status: Home / Self Care | Payer: BC Managed Care – PPO | Source: Ambulatory Visit | Attending: Internal Medicine | Admitting: Internal Medicine

## 2014-08-15 ENCOUNTER — Ambulatory Visit (INDEPENDENT_AMBULATORY_CARE_PROVIDER_SITE_OTHER): Payer: BC Managed Care – PPO | Admitting: Internal Medicine

## 2014-08-15 VITALS — BP 104/70 | HR 67 | Temp 98.5°F | Resp 16 | Ht 63.75 in | Wt 202.0 lb

## 2014-08-15 DIAGNOSIS — Z01419 Encounter for gynecological examination (general) (routine) without abnormal findings: Secondary | ICD-10-CM | POA: Diagnosis present

## 2014-08-15 DIAGNOSIS — N920 Excessive and frequent menstruation with regular cycle: Secondary | ICD-10-CM

## 2014-08-15 DIAGNOSIS — F418 Other specified anxiety disorders: Secondary | ICD-10-CM

## 2014-08-15 DIAGNOSIS — Z23 Encounter for immunization: Secondary | ICD-10-CM

## 2014-08-15 DIAGNOSIS — Z124 Encounter for screening for malignant neoplasm of cervix: Secondary | ICD-10-CM

## 2014-08-15 DIAGNOSIS — Z0000 Encounter for general adult medical examination without abnormal findings: Secondary | ICD-10-CM

## 2014-08-15 DIAGNOSIS — B354 Tinea corporis: Secondary | ICD-10-CM

## 2014-08-15 LAB — CBC WITH DIFFERENTIAL/PLATELET
BASOS ABS: 0.1 10*3/uL (ref 0.0–0.1)
Basophils Relative: 0.9 % (ref 0.0–3.0)
EOS PCT: 3.6 % (ref 0.0–5.0)
Eosinophils Absolute: 0.2 10*3/uL (ref 0.0–0.7)
HCT: 43.5 % (ref 36.0–46.0)
Hemoglobin: 14.3 g/dL (ref 12.0–15.0)
LYMPHS ABS: 1.7 10*3/uL (ref 0.7–4.0)
LYMPHS PCT: 30 % (ref 12.0–46.0)
MCHC: 33 g/dL (ref 30.0–36.0)
MCV: 89.1 fl (ref 78.0–100.0)
MONOS PCT: 7.5 % (ref 3.0–12.0)
Monocytes Absolute: 0.4 10*3/uL (ref 0.1–1.0)
NEUTROS PCT: 58 % (ref 43.0–77.0)
Neutro Abs: 3.2 10*3/uL (ref 1.4–7.7)
PLATELETS: 252 10*3/uL (ref 150.0–400.0)
RBC: 4.88 Mil/uL (ref 3.87–5.11)
RDW: 13.1 % (ref 11.5–15.5)
WBC: 5.5 10*3/uL (ref 4.0–10.5)

## 2014-08-15 LAB — COMPREHENSIVE METABOLIC PANEL
ALBUMIN: 3.7 g/dL (ref 3.5–5.2)
ALT: 13 U/L (ref 0–35)
AST: 24 U/L (ref 0–37)
Alkaline Phosphatase: 64 U/L (ref 39–117)
BILIRUBIN TOTAL: 0.8 mg/dL (ref 0.2–1.2)
BUN: 11 mg/dL (ref 6–23)
CHLORIDE: 105 meq/L (ref 96–112)
CO2: 26 meq/L (ref 19–32)
Calcium: 9.4 mg/dL (ref 8.4–10.5)
Creatinine, Ser: 0.7 mg/dL (ref 0.4–1.2)
GFR: 106.15 mL/min (ref >60.00)
GLUCOSE: 93 mg/dL (ref 70–99)
POTASSIUM: 4.4 meq/L (ref 3.5–5.1)
SODIUM: 140 meq/L (ref 135–145)
TOTAL PROTEIN: 7.4 g/dL (ref 6.0–8.3)

## 2014-08-15 LAB — LIPID PANEL
CHOL/HDL RATIO: 7
CHOLESTEROL: 203 mg/dL — AB (ref 0–200)
HDL: 30.3 mg/dL — ABNORMAL LOW (ref >39.00)
LDL CALC: 146 mg/dL — AB (ref 0–99)
NONHDL: 172.7
Triglycerides: 136 mg/dL (ref 0.0–149.0)
VLDL: 27.2 mg/dL (ref 0.0–40.0)

## 2014-08-15 LAB — TSH: TSH: 1.26 u[IU]/mL (ref 0.35–4.50)

## 2014-08-15 MED ORDER — ESCITALOPRAM OXALATE 10 MG PO TABS: 10.0000 mg | ORAL_TABLET | Freq: Every day | ORAL | Status: DC

## 2014-08-15 MED ORDER — KETOCONAZOLE 2 % EX CREA: 1.0000 "application " | TOPICAL_CREAM | Freq: Every day | CUTANEOUS | Status: DC

## 2014-08-15 NOTE — Assessment & Plan Note (Signed)
Exam done PAP collected and sent Vaccines were reviewed and updated Labs ordered Pt ed material as given

## 2014-08-15 NOTE — Assessment & Plan Note (Signed)
GYN referral - she wants to get an IUD

## 2014-08-15 NOTE — Addendum Note (Signed)
Addended by: Rock NephewARCHIE, Darron Stuck T on: 08/15/2014 02:57 PM   Modules accepted: Orders

## 2014-08-15 NOTE — Patient Instructions (Signed)
Preventive Care for Adults A healthy lifestyle and preventive care can promote health and wellness. Preventive health guidelines for women include the following key practices.  A routine yearly physical is a good way to check with your health care provider about your health and preventive screening. It is a chance to share any concerns and updates on your health and to receive a thorough exam.  Visit your dentist for a routine exam and preventive care every 6 months. Brush your teeth twice a day and floss once a day. Good oral hygiene prevents tooth decay and gum disease.  The frequency of eye exams is based on your age, health, family medical history, use of contact lenses, and other factors. Follow your health care provider's recommendations for frequency of eye exams.  Eat a healthy diet. Foods like vegetables, fruits, whole grains, low-fat dairy products, and lean protein foods contain the nutrients you need without too many calories. Decrease your intake of foods high in solid fats, added sugars, and salt. Eat the right amount of calories for you.Get information about a proper diet from your health care provider, if necessary.  Regular physical exercise is one of the most important things you can do for your health. Most adults should get at least 150 minutes of moderate-intensity exercise (any activity that increases your heart rate and causes you to sweat) each week. In addition, most adults need muscle-strengthening exercises on 2 or more days a week.  Maintain a healthy weight. The body mass index (BMI) is a screening tool to identify possible weight problems. It provides an estimate of body fat based on height and weight. Your health care provider can find your BMI and can help you achieve or maintain a healthy weight.For adults 20 years and older:  A BMI below 18.5 is considered underweight.  A BMI of 18.5 to 24.9 is normal.  A BMI of 25 to 29.9 is considered overweight.  A BMI of  30 and above is considered obese.  Maintain normal blood lipids and cholesterol levels by exercising and minimizing your intake of saturated fat. Eat a balanced diet with plenty of fruit and vegetables. Blood tests for lipids and cholesterol should begin at age 76 and be repeated every 5 years. If your lipid or cholesterol levels are high, you are over 50, or you are at high risk for heart disease, you may need your cholesterol levels checked more frequently.Ongoing high lipid and cholesterol levels should be treated with medicines if diet and exercise are not working.  If you smoke, find out from your health care provider how to quit. If you do not use tobacco, do not start.  Lung cancer screening is recommended for adults aged 22-80 years who are at high risk for developing lung cancer because of a history of smoking. A yearly low-dose CT scan of the lungs is recommended for people who have at least a 30-pack-year history of smoking and are a current smoker or have quit within the past 15 years. A pack year of smoking is smoking an average of 1 pack of cigarettes a day for 1 year (for example: 1 pack a day for 30 years or 2 packs a day for 15 years). Yearly screening should continue until the smoker has stopped smoking for at least 15 years. Yearly screening should be stopped for people who develop a health problem that would prevent them from having lung cancer treatment.  If you are pregnant, do not drink alcohol. If you are breastfeeding,  be very cautious about drinking alcohol. If you are not pregnant and choose to drink alcohol, do not have more than 1 drink per day. One drink is considered to be 12 ounces (355 mL) of beer, 5 ounces (148 mL) of wine, or 1.5 ounces (44 mL) of liquor.  Avoid use of street drugs. Do not share needles with anyone. Ask for help if you need support or instructions about stopping the use of drugs.  High blood pressure causes heart disease and increases the risk of  stroke. Your blood pressure should be checked at least every 1 to 2 years. Ongoing high blood pressure should be treated with medicines if weight loss and exercise do not work.  If you are 75-52 years old, ask your health care provider if you should take aspirin to prevent strokes.  Diabetes screening involves taking a blood sample to check your fasting blood sugar level. This should be done once every 3 years, after age 15, if you are within normal weight and without risk factors for diabetes. Testing should be considered at a younger age or be carried out more frequently if you are overweight and have at least 1 risk factor for diabetes.  Breast cancer screening is essential preventive care for women. You should practice "breast self-awareness." This means understanding the normal appearance and feel of your breasts and may include breast self-examination. Any changes detected, no matter how small, should be reported to a health care provider. Women in their 58s and 30s should have a clinical breast exam (CBE) by a health care provider as part of a regular health exam every 1 to 3 years. After age 16, women should have a CBE every year. Starting at age 53, women should consider having a mammogram (breast X-ray test) every year. Women who have a family history of breast cancer should talk to their health care provider about genetic screening. Women at a high risk of breast cancer should talk to their health care providers about having an MRI and a mammogram every year.  Breast cancer gene (BRCA)-related cancer risk assessment is recommended for women who have family members with BRCA-related cancers. BRCA-related cancers include breast, ovarian, tubal, and peritoneal cancers. Having family members with these cancers may be associated with an increased risk for harmful changes (mutations) in the breast cancer genes BRCA1 and BRCA2. Results of the assessment will determine the need for genetic counseling and  BRCA1 and BRCA2 testing.  Routine pelvic exams to screen for cancer are no longer recommended for nonpregnant women who are considered low risk for cancer of the pelvic organs (ovaries, uterus, and vagina) and who do not have symptoms. Ask your health care provider if a screening pelvic exam is right for you.  If you have had past treatment for cervical cancer or a condition that could lead to cancer, you need Pap tests and screening for cancer for at least 20 years after your treatment. If Pap tests have been discontinued, your risk factors (such as having a new sexual partner) need to be reassessed to determine if screening should be resumed. Some women have medical problems that increase the chance of getting cervical cancer. In these cases, your health care provider may recommend more frequent screening and Pap tests.  The HPV test is an additional test that may be used for cervical cancer screening. The HPV test looks for the virus that can cause the cell changes on the cervix. The cells collected during the Pap test can be  tested for HPV. The HPV test could be used to screen women aged 30 years and older, and should be used in women of any age who have unclear Pap test results. After the age of 30, women should have HPV testing at the same frequency as a Pap test.  Colorectal cancer can be detected and often prevented. Most routine colorectal cancer screening begins at the age of 50 years and continues through age 75 years. However, your health care provider may recommend screening at an earlier age if you have risk factors for colon cancer. On a yearly basis, your health care provider may provide home test kits to check for hidden blood in the stool. Use of a small camera at the end of a tube, to directly examine the colon (sigmoidoscopy or colonoscopy), can detect the earliest forms of colorectal cancer. Talk to your health care provider about this at age 50, when routine screening begins. Direct  exam of the colon should be repeated every 5-10 years through age 75 years, unless early forms of pre-cancerous polyps or small growths are found.  People who are at an increased risk for hepatitis B should be screened for this virus. You are considered at high risk for hepatitis B if:  You were born in a country where hepatitis B occurs often. Talk with your health care provider about which countries are considered high risk.  Your parents were born in a high-risk country and you have not received a shot to protect against hepatitis B (hepatitis B vaccine).  You have HIV or AIDS.  You use needles to inject street drugs.  You live with, or have sex with, someone who has hepatitis B.  You get hemodialysis treatment.  You take certain medicines for conditions like cancer, organ transplantation, and autoimmune conditions.  Hepatitis C blood testing is recommended for all people born from 1945 through 1965 and any individual with known risks for hepatitis C.  Practice safe sex. Use condoms and avoid high-risk sexual practices to reduce the spread of sexually transmitted infections (STIs). STIs include gonorrhea, chlamydia, syphilis, trichomonas, herpes, HPV, and human immunodeficiency virus (HIV). Herpes, HIV, and HPV are viral illnesses that have no cure. They can result in disability, cancer, and death.  You should be screened for sexually transmitted illnesses (STIs) including gonorrhea and chlamydia if:  You are sexually active and are younger than 24 years.  You are older than 24 years and your health care provider tells you that you are at risk for this type of infection.  Your sexual activity has changed since you were last screened and you are at an increased risk for chlamydia or gonorrhea. Ask your health care provider if you are at risk.  If you are at risk of being infected with HIV, it is recommended that you take a prescription medicine daily to prevent HIV infection. This is  called preexposure prophylaxis (PrEP). You are considered at risk if:  You are a heterosexual woman, are sexually active, and are at increased risk for HIV infection.  You take drugs by injection.  You are sexually active with a partner who has HIV.  Talk with your health care provider about whether you are at high risk of being infected with HIV. If you choose to begin PrEP, you should first be tested for HIV. You should then be tested every 3 months for as long as you are taking PrEP.  Osteoporosis is a disease in which the bones lose minerals and strength   with aging. This can result in serious bone fractures or breaks. The risk of osteoporosis can be identified using a bone density scan. Women ages 65 years and over and women at risk for fractures or osteoporosis should discuss screening with their health care providers. Ask your health care provider whether you should take a calcium supplement or vitamin D to reduce the rate of osteoporosis.  Menopause can be associated with physical symptoms and risks. Hormone replacement therapy is available to decrease symptoms and risks. You should talk to your health care provider about whether hormone replacement therapy is right for you.  Use sunscreen. Apply sunscreen liberally and repeatedly throughout the day. You should seek shade when your shadow is shorter than you. Protect yourself by wearing long sleeves, pants, a wide-brimmed hat, and sunglasses year round, whenever you are outdoors.  Once a month, do a whole body skin exam, using a mirror to look at the skin on your back. Tell your health care provider of new moles, moles that have irregular borders, moles that are larger than a pencil eraser, or moles that have changed in shape or color.  Stay current with required vaccines (immunizations).  Influenza vaccine. All adults should be immunized every year.  Tetanus, diphtheria, and acellular pertussis (Td, Tdap) vaccine. Pregnant women should  receive 1 dose of Tdap vaccine during each pregnancy. The dose should be obtained regardless of the length of time since the last dose. Immunization is preferred during the 27th-36th week of gestation. An adult who has not previously received Tdap or who does not know her vaccine status should receive 1 dose of Tdap. This initial dose should be followed by tetanus and diphtheria toxoids (Td) booster doses every 10 years. Adults with an unknown or incomplete history of completing a 3-dose immunization series with Td-containing vaccines should begin or complete a primary immunization series including a Tdap dose. Adults should receive a Td booster every 10 years.  Varicella vaccine. An adult without evidence of immunity to varicella should receive 2 doses or a second dose if she has previously received 1 dose. Pregnant females who do not have evidence of immunity should receive the first dose after pregnancy. This first dose should be obtained before leaving the health care facility. The second dose should be obtained 4-8 weeks after the first dose.  Human papillomavirus (HPV) vaccine. Females aged 13-26 years who have not received the vaccine previously should obtain the 3-dose series. The vaccine is not recommended for use in pregnant females. However, pregnancy testing is not needed before receiving a dose. If a female is found to be pregnant after receiving a dose, no treatment is needed. In that case, the remaining doses should be delayed until after the pregnancy. Immunization is recommended for any person with an immunocompromised condition through the age of 26 years if she did not get any or all doses earlier. During the 3-dose series, the second dose should be obtained 4-8 weeks after the first dose. The third dose should be obtained 24 weeks after the first dose and 16 weeks after the second dose.  Zoster vaccine. One dose is recommended for adults aged 60 years or older unless certain conditions are  present.  Measles, mumps, and rubella (MMR) vaccine. Adults born before 1957 generally are considered immune to measles and mumps. Adults born in 1957 or later should have 1 or more doses of MMR vaccine unless there is a contraindication to the vaccine or there is laboratory evidence of immunity to   each of the three diseases. A routine second dose of MMR vaccine should be obtained at least 28 days after the first dose for students attending postsecondary schools, health care workers, or international travelers. People who received inactivated measles vaccine or an unknown type of measles vaccine during 1963-1967 should receive 2 doses of MMR vaccine. People who received inactivated mumps vaccine or an unknown type of mumps vaccine before 1979 and are at high risk for mumps infection should consider immunization with 2 doses of MMR vaccine. For females of childbearing age, rubella immunity should be determined. If there is no evidence of immunity, females who are not pregnant should be vaccinated. If there is no evidence of immunity, females who are pregnant should delay immunization until after pregnancy. Unvaccinated health care workers born before 1957 who lack laboratory evidence of measles, mumps, or rubella immunity or laboratory confirmation of disease should consider measles and mumps immunization with 2 doses of MMR vaccine or rubella immunization with 1 dose of MMR vaccine.  Pneumococcal 13-valent conjugate (PCV13) vaccine. When indicated, a person who is uncertain of her immunization history and has no record of immunization should receive the PCV13 vaccine. An adult aged 19 years or older who has certain medical conditions and has not been previously immunized should receive 1 dose of PCV13 vaccine. This PCV13 should be followed with a dose of pneumococcal polysaccharide (PPSV23) vaccine. The PPSV23 vaccine dose should be obtained at least 8 weeks after the dose of PCV13 vaccine. An adult aged 19  years or older who has certain medical conditions and previously received 1 or more doses of PPSV23 vaccine should receive 1 dose of PCV13. The PCV13 vaccine dose should be obtained 1 or more years after the last PPSV23 vaccine dose.  Pneumococcal polysaccharide (PPSV23) vaccine. When PCV13 is also indicated, PCV13 should be obtained first. All adults aged 65 years and older should be immunized. An adult younger than age 65 years who has certain medical conditions should be immunized. Any person who resides in a nursing home or long-term care facility should be immunized. An adult smoker should be immunized. People with an immunocompromised condition and certain other conditions should receive both PCV13 and PPSV23 vaccines. People with human immunodeficiency virus (HIV) infection should be immunized as soon as possible after diagnosis. Immunization during chemotherapy or radiation therapy should be avoided. Routine use of PPSV23 vaccine is not recommended for American Indians, [place] Natives, or people younger than 65 years unless there are medical conditions that require PPSV23 vaccine. When indicated, people who have unknown immunization and have no record of immunization should receive PPSV23 vaccine. One-time revaccination 5 years after the first dose of PPSV23 is recommended for people aged 19-64 years who have chronic kidney failure, nephrotic syndrome, asplenia, or immunocompromised conditions. People who received 1-2 doses of PPSV23 before age 65 years should receive another dose of PPSV23 vaccine at age 65 years or later if at least 5 years have passed since the previous dose. Doses of PPSV23 are not needed for people immunized with PPSV23 at or after age 65 years.  Meningococcal vaccine. Adults with asplenia or persistent complement component deficiencies should receive 2 doses of quadrivalent meningococcal conjugate (MenACWY-D) vaccine. The doses should be obtained at least 2 months apart.  Microbiologists working with certain meningococcal bacteria, military recruits, people at risk during an outbreak, and people who travel to or live in countries with a high rate of meningitis should be immunized. A first-year college student up through age   21 years who is living in a residence hall should receive a dose if she did not receive a dose on or after her 16th birthday. Adults who have certain high-risk conditions should receive one or more doses of vaccine.  Hepatitis A vaccine. Adults who wish to be protected from this disease, have certain high-risk conditions, work with hepatitis A-infected animals, work in hepatitis A research labs, or travel to or work in countries with a high rate of hepatitis A should be immunized. Adults who were previously unvaccinated and who anticipate close contact with an international adoptee during the first 60 days after arrival in the [place] from a country with a high rate of hepatitis A should be immunized.  Hepatitis B vaccine. Adults who wish to be protected from this disease, have certain high-risk conditions, may be exposed to blood or other infectious body fluids, are household contacts or sex partners of hepatitis B positive people, are clients or workers in certain care facilities, or travel to or work in countries with a high rate of hepatitis B should be immunized.  Haemophilus influenzae type b (Hib) vaccine. A previously unvaccinated person with asplenia or sickle cell disease or having a scheduled splenectomy should receive 1 dose of Hib vaccine. Regardless of previous immunization, a recipient of a hematopoietic stem cell transplant should receive a 3-dose series 6-12 months after her successful transplant. Hib vaccine is not recommended for adults with HIV infection. Preventive Services / Frequency Ages 64 to 68 years  Blood pressure check.** / Every 1 to 2 years.  Lipid and cholesterol check.** / Every 5 years beginning at age  22.  Clinical breast exam.** / Every 3 years for women in their 88s and 53s.  BRCA-related cancer risk assessment.** / For women who have family members with a BRCA-related cancer (breast, ovarian, tubal, or peritoneal cancers).  Pap test.** / Every 2 years from ages 90 through 51. Every 3 years starting at age 21 through age 56 or 3 with a history of 3 consecutive normal Pap tests.  HPV screening.** / Every 3 years from ages 24 through ages 1 to 46 with a history of 3 consecutive normal Pap tests.  Hepatitis C blood test.** / For any individual with known risks for hepatitis C.  Skin self-exam. / Monthly.  Influenza vaccine. / Every year.  Tetanus, diphtheria, and acellular pertussis (Tdap, Td) vaccine.** / Consult your health care provider. Pregnant women should receive 1 dose of Tdap vaccine during each pregnancy. 1 dose of Td every 10 years.  Varicella vaccine.** / Consult your health care provider. Pregnant females who do not have evidence of immunity should receive the first dose after pregnancy.  HPV vaccine. / 3 doses over 6 months, if 72 and younger. The vaccine is not recommended for use in pregnant females. However, pregnancy testing is not needed before receiving a dose.  Measles, mumps, rubella (MMR) vaccine.** / You need at least 1 dose of MMR if you were born in 1957 or later. You may also need a 2nd dose. For females of childbearing age, rubella immunity should be determined. If there is no evidence of immunity, females who are not pregnant should be vaccinated. If there is no evidence of immunity, females who are pregnant should delay immunization until after pregnancy.  Pneumococcal 13-valent conjugate (PCV13) vaccine.** / Consult your health care provider.  Pneumococcal polysaccharide (PPSV23) vaccine.** / 1 to 2 doses if you smoke cigarettes or if you have certain conditions.  Meningococcal vaccine.** /  1 dose if you are age 19 to 21 years and a first-year college  student living in a residence hall, or have one of several medical conditions, you need to get vaccinated against meningococcal disease. You may also need additional booster doses.  Hepatitis A vaccine.** / Consult your health care provider.  Hepatitis B vaccine.** / Consult your health care provider.  Haemophilus influenzae type b (Hib) vaccine.** / Consult your health care provider. Ages 40 to 64 years  Blood pressure check.** / Every 1 to 2 years.  Lipid and cholesterol check.** / Every 5 years beginning at age 20 years.  Lung cancer screening. / Every year if you are aged 55-80 years and have a 30-pack-year history of smoking and currently smoke or have quit within the past 15 years. Yearly screening is stopped once you have quit smoking for at least 15 years or develop a health problem that would prevent you from having lung cancer treatment.  Clinical breast exam.** / Every year after age 40 years.  BRCA-related cancer risk assessment.** / For women who have family members with a BRCA-related cancer (breast, ovarian, tubal, or peritoneal cancers).  Mammogram.** / Every year beginning at age 40 years and continuing for as long as you are in good health. Consult with your health care provider.  Pap test.** / Every 3 years starting at age 30 years through age 65 or 70 years with a history of 3 consecutive normal Pap tests.  HPV screening.** / Every 3 years from ages 30 years through ages 65 to 70 years with a history of 3 consecutive normal Pap tests.  Fecal occult blood test (FOBT) of stool. / Every year beginning at age 50 years and continuing until age 75 years. You may not need to do this test if you get a colonoscopy every 10 years.  Flexible sigmoidoscopy or colonoscopy.** / Every 5 years for a flexible sigmoidoscopy or every 10 years for a colonoscopy beginning at age 50 years and continuing until age 75 years.  Hepatitis C blood test.** / For all people born from 1945 through  1965 and any individual with known risks for hepatitis C.  Skin self-exam. / Monthly.  Influenza vaccine. / Every year.  Tetanus, diphtheria, and acellular pertussis (Tdap/Td) vaccine.** / Consult your health care provider. Pregnant women should receive 1 dose of Tdap vaccine during each pregnancy. 1 dose of Td every 10 years.  Varicella vaccine.** / Consult your health care provider. Pregnant females who do not have evidence of immunity should receive the first dose after pregnancy.  Zoster vaccine.** / 1 dose for adults aged 60 years or older.  Measles, mumps, rubella (MMR) vaccine.** / You need at least 1 dose of MMR if you were born in 1957 or later. You may also need a 2nd dose. For females of childbearing age, rubella immunity should be determined. If there is no evidence of immunity, females who are not pregnant should be vaccinated. If there is no evidence of immunity, females who are pregnant should delay immunization until after pregnancy.  Pneumococcal 13-valent conjugate (PCV13) vaccine.** / Consult your health care provider.  Pneumococcal polysaccharide (PPSV23) vaccine.** / 1 to 2 doses if you smoke cigarettes or if you have certain conditions.  Meningococcal vaccine.** / Consult your health care provider.  Hepatitis A vaccine.** / Consult your health care provider.  Hepatitis B vaccine.** / Consult your health care provider.  Haemophilus influenzae type b (Hib) vaccine.** / Consult your health care provider. Ages 65   years and over  Blood pressure check.** / Every 1 to 2 years.  Lipid and cholesterol check.** / Every 5 years beginning at age 49 years.  Lung cancer screening. / Every year if you are aged 55-80 years and have a 30-pack-year history of smoking and currently smoke or have quit within the past 15 years. Yearly screening is stopped once you have quit smoking for at least 15 years or develop a health problem that would prevent you from having lung cancer  treatment.  Clinical breast exam.** / Every year after age 56 years.  BRCA-related cancer risk assessment.** / For women who have family members with a BRCA-related cancer (breast, ovarian, tubal, or peritoneal cancers).  Mammogram.** / Every year beginning at age 57 years and continuing for as long as you are in good health. Consult with your health care provider.  Pap test.** / Every 3 years starting at age 19 years through age 63 or 39 years with 3 consecutive normal Pap tests. Testing can be stopped between 65 and 70 years with 3 consecutive normal Pap tests and no abnormal Pap or HPV tests in the past 10 years.  HPV screening.** / Every 3 years from ages 35 years through ages 29 or 38 years with a history of 3 consecutive normal Pap tests. Testing can be stopped between 65 and 70 years with 3 consecutive normal Pap tests and no abnormal Pap or HPV tests in the past 10 years.  Fecal occult blood test (FOBT) of stool. / Every year beginning at age 65 years and continuing until age 35 years. You may not need to do this test if you get a colonoscopy every 10 years.  Flexible sigmoidoscopy or colonoscopy.** / Every 5 years for a flexible sigmoidoscopy or every 10 years for a colonoscopy beginning at age 78 years and continuing until age 88 years.  Hepatitis C blood test.** / For all people born from [birth year] through 1965 and any individual with known risks for hepatitis C.  Osteoporosis screening.** / A one-time screening for women ages 48 years and over and women at risk for fractures or osteoporosis.  Skin self-exam. / Monthly.  Influenza vaccine. / Every year.  Tetanus, diphtheria, and acellular pertussis (Tdap/Td) vaccine.** / 1 dose of Td every 10 years.  Varicella vaccine.** / Consult your health care provider.  Zoster vaccine.** / 1 dose for adults aged 34 years or older.  Pneumococcal 13-valent conjugate (PCV13) vaccine.** / Consult your health care provider.  Pneumococcal  polysaccharide (PPSV23) vaccine.** / 1 dose for all adults aged 66 years and older.  Meningococcal vaccine.** / Consult your health care provider.  Hepatitis A vaccine.** / Consult your health care provider.  Hepatitis B vaccine.** / Consult your health care provider.  Haemophilus influenzae type b (Hib) vaccine.** / Consult your health care provider. ** Family history and personal history of risk and conditions may change your health care provider's recommendations. Document Released: 12/13/2001 Document Revised: 03/03/2014 Document Reviewed: 03/14/2011 [facility] Patient Information 2015 [place], [place]. This information is not intended to replace advice given to you by your health care provider. Make sure you discuss any questions you have with your health care provider. Body Ringworm Ringworm (tinea corporis) is a fungal infection of the skin on the body. This infection is not caused by worms, but is actually caused by a fungus. Fungus normally lives on the top of your skin and can be useful. However, in the case of ringworms, the fungus grows out of control  and causes a skin infection. It can involve any area of skin on the body and can spread easily from one person to another (contagious). Ringworm is a common problem for children, but it can affect adults as well. Ringworm is also often found in athletes, especially wrestlers who share equipment and mats.  CAUSES  Ringworm of the body is caused by a fungus called dermatophyte. It can spread by:  Touchingother people who are infected.  Touchinginfected pets.  Touching or sharingobjects that have been in contact with the infected person or pet (hats, combs, towels, clothing, sports equipment). SYMPTOMS   Itchy, raised red spots and bumps on the skin.  Ring-shaped rash.  Redness near the border of the rash with a clear center.  Dry and scaly skin on or around the rash. Not every person develops a ring-shaped rash. Some develop  only the red, scaly patches. DIAGNOSIS  Most often, ringworm can be diagnosed by performing a skin exam. Your caregiver may choose to take a skin scraping from the affected area. The sample will be examined under the microscope to see if the fungus is present.  TREATMENT  Body ringworm may be treated with a topical antifungal cream or ointment. Sometimes, an antifungal shampoo that can be used on your body is prescribed. You may be prescribed antifungal medicines to take by mouth if your ringworm is severe, keeps coming back, or lasts a long time.  HOME CARE INSTRUCTIONS   Only take over-the-counter or prescription medicines as directed by your caregiver.  Wash the infected area and dry it completely before applying yourcream or ointment.  When using antifungal shampoo to treat the ringworm, leave the shampoo on the body for 3-5 minutes before rinsing.   Wear loose clothing to stop clothes from rubbing and irritating the rash.  Wash or change your bed sheets every night while you have the rash.  Have your pet treated by your veterinarian if it has the same infection. To prevent ringworm:   Practice good hygiene.  Wear sandals or shoes in public places and showers.  Do not share personal items with others.  Avoid touching red patches of skin on other people.  Avoid touching pets that have bald spots or wash your hands after doing so. SEEK MEDICAL CARE IF:   Your rash continues to spread after 7 days of treatment.  Your rash is not gone in 4 weeks.  The area around your rash becomes red, warm, tender, and swollen. Document Released: 10/14/2000 Document Revised: 07/11/2012 Document Reviewed: 04/30/2012 [facility] Patient Information 2015 [place], [place]. This information is not intended to replace advice given to you by your health care provider. Make sure you discuss any questions you have with your health care provider.

## 2014-08-15 NOTE — Progress Notes (Signed)
Subjective:    Patient ID: Danielle RidgelBonnie J Beigel, female    DOB: 01/11/1976, 38 y.o.   MRN: [id number]  Rash This is a recurrent problem. The current episode started 1 to 4 weeks ago. The problem is unchanged. The affected locations include the left wrist. The rash is characterized by redness. She was exposed to nothing. Pertinent negatives include no anorexia, congestion, cough, diarrhea, eye pain, facial edema, fatigue, fever, joint pain, nail changes, rhinorrhea, shortness of breath, sore throat or vomiting. Past treatments include nothing. The treatment provided no relief. There is no history of allergies, asthma, eczema or varicella.      Review of Systems  Constitutional: Negative.  Negative for fever, chills, diaphoresis, activity change, appetite change, fatigue and unexpected weight change.  HENT: Negative.  Negative for congestion, rhinorrhea and sore throat.   Eyes: Negative.  Negative for pain.  Respiratory: Negative.  Negative for apnea, cough, choking, chest tightness, shortness of breath and wheezing.   Cardiovascular: Negative.  Negative for chest pain, palpitations and leg swelling.  Gastrointestinal: Negative.  Negative for nausea, vomiting, abdominal pain, diarrhea, constipation, blood in stool and anorexia.  Endocrine: Negative.   Genitourinary: Negative.  Negative for vaginal bleeding, vaginal discharge, difficulty urinating and menstrual problem.  Musculoskeletal: Negative.  Negative for arthralgias, back pain, joint pain, joint swelling and myalgias.  Skin: Positive for color change and rash. Negative for nail changes, pallor and wound.  Allergic/Immunologic: Negative.   Neurological: Negative.   Hematological: Negative.  Negative for adenopathy. Does not bruise/bleed easily.  Psychiatric/Behavioral: Positive for sleep disturbance and dysphoric mood. Negative for suicidal ideas, hallucinations, behavioral problems, confusion, self-injury, decreased concentration and  agitation. The patient is nervous/anxious. The patient is not hyperactive.        Objective:   Physical Exam  Vitals reviewed. Constitutional: She is oriented to person, place, and time. She appears well-developed and well-nourished.  Non-toxic appearance. She does not have a sickly appearance. She does not appear ill. No distress.  HENT:  Head: Normocephalic and atraumatic.  Mouth/Throat: Oropharynx is clear and moist. No oropharyngeal exudate.  Eyes: Conjunctivae are normal. Right eye exhibits no discharge. Left eye exhibits no discharge. No scleral icterus.  Neck: Normal range of motion. Neck supple. No JVD present. No tracheal deviation present. No thyromegaly present.  Cardiovascular: Normal rate, regular rhythm, normal heart sounds and intact distal pulses.  Exam reveals no gallop and no friction rub.   No murmur heard. Pulmonary/Chest: Effort normal and breath sounds normal. No stridor. No respiratory distress. She has no wheezes. She has no rales. Chest wall is not dull to percussion. She exhibits no mass, no tenderness, no bony tenderness, no laceration, no crepitus, no edema, no deformity, no swelling and no retraction. Right breast exhibits no inverted nipple, no mass, no nipple discharge, no skin change and no tenderness. Left breast exhibits no inverted nipple, no mass, no nipple discharge, no skin change and no tenderness. Breasts are symmetrical.  Abdominal: Soft. Bowel sounds are normal. She exhibits no distension and no mass. There is no tenderness. There is no rebound and no guarding. Hernia confirmed negative in the right inguinal area and confirmed negative in the left inguinal area.  Genitourinary: Vagina normal and uterus normal. No breast swelling, tenderness, discharge or bleeding. Pelvic exam was performed with patient supine. No labial fusion. There is no rash, tenderness, lesion or injury on the right labia. There is no rash, tenderness, lesion or injury on the left labia.  Uterus is  not deviated, not enlarged, not fixed and not tender. Cervix exhibits no motion tenderness, no discharge and no friability. Right adnexum displays no mass, no tenderness and no fullness. Left adnexum displays no mass, no tenderness and no fullness. No erythema, tenderness or bleeding around the vagina. No foreign body around the vagina. No signs of injury around the vagina. No vaginal discharge found.  Musculoskeletal: Normal range of motion. She exhibits no edema and no tenderness.  Lymphadenopathy:    She has no cervical adenopathy.       Right: No inguinal adenopathy present.       Left: No inguinal adenopathy present.  Neurological: She is oriented to person, place, and time.  Skin: Skin is warm and dry. No rash noted. She is not diaphoretic. No erythema. No pallor.     Psychiatric: She has a normal mood and affect. Her behavior is normal. Judgment and thought content normal.     Lab Results  Component Value Date   WBC 6.7 03/22/2013   HGB 13.7 03/22/2013   HCT 39.5 03/22/2013   PLT 252.0 03/22/2013   GLUCOSE 83 03/22/2013   CHOL 180 03/22/2013   TRIG 89.0 03/22/2013   HDL 43.90 03/22/2013   LDLCALC 118* 03/22/2013   ALT 20 03/22/2013   AST 22 03/22/2013   NA 140 03/22/2013   K 4.1 03/22/2013   CL 105 03/22/2013   CREATININE 0.6 03/22/2013   BUN 10 03/22/2013   CO2 29 03/22/2013   TSH 1.19 03/22/2013       Assessment & Plan:

## 2014-08-15 NOTE — Assessment & Plan Note (Signed)
She is doing well on lexapro

## 2014-08-15 NOTE — Assessment & Plan Note (Signed)
Will treat with ketoconazole 

## 2014-08-19 ENCOUNTER — Encounter: Payer: Self-pay | Admitting: Internal Medicine

## 2014-08-19 LAB — HM PAP SMEAR: HM Pap smear: NORMAL

## 2014-08-19 LAB — CYTOLOGY - PAP

## 2014-09-01 ENCOUNTER — Encounter: Payer: Self-pay | Admitting: Internal Medicine

## 2015-01-14 LAB — HM PAP SMEAR

## 2015-10-22 ENCOUNTER — Other Ambulatory Visit: Payer: Self-pay | Admitting: Internal Medicine

## 2015-10-22 NOTE — Telephone Encounter (Signed)
She needs an appt.

## 2015-11-26 ENCOUNTER — Other Ambulatory Visit: Payer: Self-pay | Admitting: Internal Medicine

## 2015-11-26 ENCOUNTER — Encounter: Payer: Self-pay | Admitting: Internal Medicine

## 2015-12-10 ENCOUNTER — Other Ambulatory Visit (INDEPENDENT_AMBULATORY_CARE_PROVIDER_SITE_OTHER): Payer: Commercial Managed Care - PPO

## 2015-12-10 ENCOUNTER — Ambulatory Visit: Payer: Self-pay | Admitting: Internal Medicine

## 2015-12-10 ENCOUNTER — Encounter: Payer: Self-pay | Admitting: Internal Medicine

## 2015-12-10 ENCOUNTER — Ambulatory Visit (INDEPENDENT_AMBULATORY_CARE_PROVIDER_SITE_OTHER): Payer: Commercial Managed Care - PPO | Admitting: Internal Medicine

## 2015-12-10 VITALS — BP 118/82 | HR 78 | Temp 98.8°F | Resp 16 | Ht 64.0 in | Wt 215.4 lb

## 2015-12-10 DIAGNOSIS — Z0000 Encounter for general adult medical examination without abnormal findings: Secondary | ICD-10-CM

## 2015-12-10 DIAGNOSIS — R002 Palpitations: Secondary | ICD-10-CM

## 2015-12-10 DIAGNOSIS — M7711 Lateral epicondylitis, right elbow: Secondary | ICD-10-CM

## 2015-12-10 LAB — COMPREHENSIVE METABOLIC PANEL
ALT: 19 U/L (ref 0–35)
AST: 23 U/L (ref 0–37)
Albumin: 4.2 g/dL (ref 3.5–5.2)
Alkaline Phosphatase: 63 U/L (ref 39–117)
BILIRUBIN TOTAL: 0.5 mg/dL (ref 0.2–1.2)
BUN: 10 mg/dL (ref 6–23)
CALCIUM: 9.8 mg/dL (ref 8.4–10.5)
CHLORIDE: 104 meq/L (ref 96–112)
CO2: 31 meq/L (ref 19–32)
Creatinine, Ser: 0.53 mg/dL (ref 0.40–1.20)
GFR: 135.8 mL/min (ref >60.00)
GLUCOSE: 92 mg/dL (ref 70–99)
Potassium: 4.5 mEq/L (ref 3.5–5.1)
Sodium: 140 mEq/L (ref 135–145)
Total Protein: 7 g/dL (ref 6.0–8.3)

## 2015-12-10 LAB — CBC WITH DIFFERENTIAL/PLATELET
BASOS PCT: 0.3 % (ref 0.0–3.0)
Basophils Absolute: 0 10*3/uL (ref 0.0–0.1)
EOS PCT: 2.9 % (ref 0.0–5.0)
Eosinophils Absolute: 0.3 10*3/uL (ref 0.0–0.7)
HEMATOCRIT: 42.1 % (ref 36.0–46.0)
Hemoglobin: 14 g/dL (ref 12.0–15.0)
LYMPHS ABS: 2.3 10*3/uL (ref 0.7–4.0)
LYMPHS PCT: 27.2 % (ref 12.0–46.0)
MCHC: 33.3 g/dL (ref 30.0–36.0)
MCV: 89.2 fl (ref 78.0–100.0)
MONOS PCT: 5.2 % (ref 3.0–12.0)
Monocytes Absolute: 0.4 10*3/uL (ref 0.1–1.0)
NEUTROS ABS: 5.5 10*3/uL (ref 1.4–7.7)
Neutrophils Relative %: 64.4 % (ref 43.0–77.0)
PLATELETS: 216 10*3/uL (ref 150.0–400.0)
RBC: 4.71 Mil/uL (ref 3.87–5.11)
RDW: 13.1 % (ref 11.5–15.5)
WBC: 8.6 10*3/uL (ref 4.0–10.5)

## 2015-12-10 LAB — LIPID PANEL
CHOL/HDL RATIO: 4
Cholesterol: 196 mg/dL (ref 0–200)
HDL: 44.2 mg/dL (ref >39.00)
LDL CALC: 131 mg/dL — AB (ref 0–99)
NONHDL: 152.07
TRIGLYCERIDES: 105 mg/dL (ref 0.0–149.0)
VLDL: 21 mg/dL (ref 0.0–40.0)

## 2015-12-10 LAB — T4, FREE: FREE T4: 0.67 ng/dL (ref 0.60–1.60)

## 2015-12-10 LAB — TSH: TSH: 0.74 u[IU]/mL (ref 0.35–4.50)

## 2015-12-10 MED ORDER — ESCITALOPRAM OXALATE 10 MG PO TABS: ORAL_TABLET | ORAL | Status: DC

## 2015-12-10 NOTE — Patient Instructions (Signed)

## 2015-12-10 NOTE — Progress Notes (Signed)
Pre visit review using our clinic review tool, if applicable. No additional management support is needed unless otherwise documented below in the visit note. 

## 2015-12-10 NOTE — Progress Notes (Signed)
Subjective:  Patient ID: Danielle Cook, female    DOB: 1975/12/04  Age: 40 y.o. MRN: [id number]  CC: Annual Exam; Palpitations; and Elbow Pain   HPI [name] presents for a CPX but she also has complaints.  She complains of lingering right elbow pain for about a year. The pain is on the outside of the elbow and occurs with use and movement. She has been icing, doing exercises and taking an over-the-counter anti-inflammatories and says the pain is getting better. She wants to know what this is but does not want to consider any further treatment options.  She also complains of palpitations that started about 3-4 weeks ago. She feels some degree of palpitations nearly every day. She's had these symptoms before and was told that they were benign. She feels like her palpitations are related to the emotional stress of going through a separation and possible divorce from her husband. 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 The palpitations are associated with anxiety but no dizziness, lightheadedness, syncope, shortness of breath, or chest pain.  Outpatient Prescriptions Prior to Visit  Medication Sig Dispense Refill  . loratadine (CLARITIN) 10 MG tablet Take 10 mg by mouth every morning.      . Multiple Vitamins-Minerals (WOMENS MULTIVITAMIN PLUS) TABS Take by mouth.    . escitalopram (LEXAPRO) 10 MG tablet TAKE 1 TABLET (10 MG TOTAL) BY MOUTH DAILY. 90 tablet 0  . ketoconazole (NIZORAL) 2 % cream Apply 1 application topically daily. 30 g 1   No facility-administered medications prior to visit.    ROS Review of Systems  Constitutional: Negative.  Negative for activity change and unexpected weight change.  HENT: Negative.   Eyes: Negative.   Respiratory: Negative.  Negative for cough, choking, chest tightness, shortness of breath and stridor.     Cardiovascular: Positive for palpitations. Negative for chest pain and leg swelling.  Gastrointestinal: Negative.  Negative for vomiting, abdominal pain, diarrhea and constipation.  Endocrine: Negative.   Genitourinary: Negative.   Musculoskeletal: Positive for arthralgias.  Skin: Negative.  Negative for color change and rash.  Allergic/Immunologic: Negative.   Neurological: Negative.  Negative for dizziness, tremors, syncope, speech difficulty, weakness, light-headedness, numbness and headaches.  Hematological: Negative.  Negative for adenopathy. Does not bruise/bleed easily.  Psychiatric/Behavioral: Negative for suicidal ideas, sleep disturbance, dysphoric mood, decreased concentration and agitation. The patient is nervous/anxious.     Objective:  BP 118/82 mmHg  Pulse 78  Temp(Src) 98.8 F (37.1 C) (Oral)  Resp 16  Ht  (1.626 m)  Wt 215 lb 6.4 oz (97.705 kg)  BMI 36.96 kg/m2  SpO2 98%  LMP 12/01/2015  BP Readings from Last 3 Encounters:  12/10/15 118/82  08/15/14 104/70  03/22/13 102/70    Wt Readings from Last 3 Encounters:  12/10/15 215 lb 6.4 oz (97.705 kg)  08/15/14 202 lb (91.627 kg)  03/22/13 193 lb 6 oz (87.714 kg)    Physical Exam  Constitutional: She is oriented to person, place, and time. She appears well-nourished. No distress.  HENT:  Mouth/Throat: Oropharynx is clear and moist. No oropharyngeal exudate.  Eyes: Conjunctivae are normal. Right eye exhibits no discharge. Left eye exhibits no discharge. No scleral icterus.  Neck: Normal range of motion. Neck supple. No JVD present. No tracheal deviation present. No thyromegaly present.  Cardiovascular: Normal  rate, regular rhythm, normal heart sounds and intact distal pulses.  Exam reveals no gallop and no friction rub.   No murmur heard. EKG -  Sinus  Bradycardia  WITHIN NORMAL LIMITS   Pulmonary/Chest: Effort normal and breath sounds normal. No stridor. No respiratory distress. She has no wheezes.  She has no rales. She exhibits no tenderness.  Abdominal: Soft. Bowel sounds are normal. She exhibits no distension and no mass. There is no tenderness. There is no rebound and no guarding.  Musculoskeletal: Normal range of motion. She exhibits no edema.       Right elbow: She exhibits normal range of motion, no swelling, no effusion, no deformity and no laceration. Tenderness found. Medial epicondyle tenderness noted. No radial head, no lateral epicondyle and no olecranon process tenderness noted.  Lymphadenopathy:    She has no cervical adenopathy.  Neurological: She is oriented to person, place, and time.  Skin: Skin is warm and dry. No rash noted. She is not diaphoretic. No erythema. No pallor.  Psychiatric: She has a normal mood and affect. Her behavior is normal. Judgment and thought content normal.  Vitals reviewed.   Lab Results  Component Value Date   WBC 8.6 12/10/2015   HGB 14.0 12/10/2015   HCT 42.1 12/10/2015   PLT 216.0 12/10/2015   GLUCOSE 92 12/10/2015   CHOL 196 12/10/2015   TRIG 105.0 12/10/2015   HDL 44.20 12/10/2015   LDLCALC 131* 12/10/2015   ALT 19 12/10/2015   AST 23 12/10/2015   NA 140 12/10/2015   K 4.5 12/10/2015   CL 104 12/10/2015   CREATININE 0.53 12/10/2015   BUN 10 12/10/2015   CO2 31 12/10/2015   TSH 0.74 12/10/2015    No results found.  Assessment & Plan:   [name] was seen today for annual exam, palpitations and elbow pain.  Diagnoses and all orders for this visit:  Routine general medical examination at a health care facility- her vaccines were reviewed and updated, her Pap smears up-to-date through her gynecologist, exam completed today, labs ordered and reviewed, she was given patient education materials. -     Lipid panel; Future -     Comprehensive metabolic panel; Future -     CBC with Differential/Platelet; Future -     TSH; Future  Palpitations- the description of her palpitations is benign however her EKG shows mild sinus  bradycardia with a heart rate of 58, her thyroid functions are normal and her electrolytes are normal, I've asked her to undergo a 72 hour event monitor to see if she is developing symptomatic bradycardia -     EKG 12-Lead -     Holter monitor - 72 hour; Future -     T4, free; Future -     EKG 12-Lead  Other orders -     escitalopram (LEXAPRO) 10 MG tablet; TAKE 1 TABLET (10 MG TOTAL) BY MOUTH DAILY.  I have discontinued Ms. Durio's ketoconazole. I am also having her maintain her loratadine, WOMENS MULTIVITAMIN PLUS, escitalopram, and levonorgestrel.  Meds ordered this encounter  Medications  . escitalopram (LEXAPRO) 10 MG tablet    Sig: TAKE 1 TABLET (10 MG TOTAL) BY MOUTH DAILY.    Dispense:  90 tablet    Refill:  3  . levonorgestrel (MIRENA, 52 MG,) 20 MCG/24HR IUD    Sig: 1 Intra Uterine Device (1 each total) by Intrauterine route once.    Dispense:  1 each    Refill:  0  Follow-up: Return if symptoms worsen or fail to improve.  Scarlette Calico, MD

## 2015-12-11 DIAGNOSIS — M7710 Lateral epicondylitis, unspecified elbow: Secondary | ICD-10-CM | POA: Insufficient documentation

## 2015-12-11 NOTE — Assessment & Plan Note (Signed)
She is currently getting symptom relief with the remedies that she is trying at home. I offered to send her for some therapy but she is not interested in that.

## 2016-01-23 ENCOUNTER — Other Ambulatory Visit: Payer: Self-pay | Admitting: Internal Medicine

## 2016-01-23 DIAGNOSIS — R002 Palpitations: Secondary | ICD-10-CM

## 2016-01-26 ENCOUNTER — Encounter: Payer: Self-pay | Admitting: Internal Medicine

## 2016-01-26 ENCOUNTER — Other Ambulatory Visit: Payer: Self-pay | Admitting: Internal Medicine

## 2016-01-26 MED ORDER — OSELTAMIVIR PHOSPHATE 75 MG PO CAPS: 75.0000 mg | ORAL_CAPSULE | Freq: Every day | ORAL | Status: DC

## 2016-02-02 ENCOUNTER — Ambulatory Visit (INDEPENDENT_AMBULATORY_CARE_PROVIDER_SITE_OTHER): Payer: Commercial Managed Care - PPO

## 2016-02-02 DIAGNOSIS — R002 Palpitations: Secondary | ICD-10-CM | POA: Diagnosis not present

## 2016-02-16 ENCOUNTER — Encounter: Payer: Self-pay | Admitting: Internal Medicine

## 2016-03-04 ENCOUNTER — Encounter: Payer: Self-pay | Admitting: Internal Medicine

## 2016-03-07 ENCOUNTER — Telehealth: Payer: Self-pay | Admitting: Internal Medicine

## 2016-03-07 DIAGNOSIS — R200 Anesthesia of skin: Secondary | ICD-10-CM

## 2016-03-07 DIAGNOSIS — R202 Paresthesia of skin: Principal | ICD-10-CM

## 2016-03-07 NOTE — Telephone Encounter (Signed)
[facility] - Client TELEPHONE ADVICE RECORD   [facility]     Patient Name: Danielle Cook Initial Comment Caller states left arm hurting and feels hot to touch. Has bursitis in shoulder  DOB: Mar 08, 1976      Nurse Assessment  Nurse: Tera Materowan, RN, [facility] Maxwellheryl Date/Time (Eastern Time): 03/07/2016 2:32:53 PM  Confirm and document reason for call. If symptomatic, describe symptoms. You must click the next button to save text entered. ---Caller states that [facility] for pain that she had in her left arm and shoulder. She was dx with bursitis and given an injection of kenolog then prescribed a muscle relaxer. Today the arm is red and very hot to the touch all the way down to the elbow. Denies fever.  Has the patient traveled out of the country within the last 30 days? ---Not Applicable  Does the patient have any new or worsening symptoms? ---Yes  Will a triage be completed? ---Yes  Related visit to physician within the last 2 weeks? ---Yes  Does the PT have any chronic conditions? (i.e. diabetes, asthma, etc.) ---Yes  List chronic conditions. ---depression  Is the patient pregnant or possibly pregnant? (Ask all females between the ages of 312-55) ---No  Is this a behavioral health or substance abuse call? ---No    Guidelines     Guideline Title Affirmed Question Affirmed Notes   Arm Pain [1] Red area or streak AND [2] large (> 2 in. or 5 cm)    Final Disposition User   See Physician within 4 Hours (or PCP triage) Tera Materowan, RN, Elnita Maxwellheryl   Comments   No appointments available in the next 4 hrs and pt states that it is less expensive for her to [facility] since [facility] there.   Referrals   GO TO FACILITY OTHER - SPECIFY   Disagree/Comply: Comply

## 2016-03-07 NOTE — Telephone Encounter (Signed)
Patient went to [facility] on Saturday, recd kenalog in hip---but now has developed redness and warmth in her arm---no prior hx of DVT---no other symptoms---i have advised patient that dr Yetta Barrejones is out of office all week, i don't have any other openings here at [facility] today---patient stated she would go back to [facility] to see if they could figure out what is wrong---patient advised to call our office back if she feels she needs another opionion or needs acute office visit with our office----no further action needed by dr plotnikov at this time, thanks

## 2016-03-07 NOTE — Telephone Encounter (Signed)
Danielle Cook, can you call her and see how her neck is now.  If she is still have the same symptoms I am ok with prescribing a low dose muscle relaxer for her to take.  Let me know.

## 2016-03-07 NOTE — Telephone Encounter (Signed)
What is the question? Thx 

## 2016-03-07 NOTE — Telephone Encounter (Signed)
Please advise. PCP out of office  

## 2016-03-07 NOTE — Telephone Encounter (Signed)
Please advise, thanks.

## 2016-03-08 NOTE — Telephone Encounter (Signed)
FYI for everyone.   Pt went to [facility]. Rx was given to her for cellulitis.  There a [facility] phone note.   In short: pt went to Urgent Care on Saturday (after the 03/04/2016 patient email). Later developed issue in arm and then went back to Urgent Care yesterday.

## 2016-03-08 NOTE — Telephone Encounter (Signed)
Noted. Agree Thx! 

## 2016-03-10 NOTE — Telephone Encounter (Signed)
Call her - a muscle relaxer may help -- I can send one to her pharmacy if she wishes or she can be see Saturday here if there is an opening.

## 2016-03-23 ENCOUNTER — Ambulatory Visit (INDEPENDENT_AMBULATORY_CARE_PROVIDER_SITE_OTHER): Payer: Commercial Managed Care - PPO | Admitting: Internal Medicine

## 2016-03-23 ENCOUNTER — Encounter: Payer: Self-pay | Admitting: Internal Medicine

## 2016-03-23 VITALS — BP 118/78 | HR 68 | Temp 97.8°F | Resp 16 | Ht 64.0 in | Wt 218.0 lb

## 2016-03-23 DIAGNOSIS — M5412 Radiculopathy, cervical region: Secondary | ICD-10-CM

## 2016-03-23 DIAGNOSIS — R200 Anesthesia of skin: Secondary | ICD-10-CM | POA: Insufficient documentation

## 2016-03-23 DIAGNOSIS — R202 Paresthesia of skin: Secondary | ICD-10-CM

## 2016-03-23 MED ORDER — TRAMADOL HCL 50 MG PO TABS: 50.0000 mg | ORAL_TABLET | Freq: Four times a day (QID) | ORAL | Status: DC | PRN

## 2016-03-23 MED ORDER — METHOCARBAMOL 500 MG PO TABS: 500.0000 mg | ORAL_TABLET | Freq: Three times a day (TID) | ORAL | Status: DC | PRN

## 2016-03-23 NOTE — Patient Instructions (Signed)
Acute Torticollis °Torticollis is a condition in which the muscles of the neck tighten (contract) abnormally, causing the neck to twist and the head to move into an unnatural position. Torticollis that develops suddenly is called acute torticollis. If torticollis becomes chronic and is left untreated, the face and neck can become deformed. °CAUSES °This condition may be caused by: °· Sleeping in an awkward position (common). °· Extending or twisting the neck muscles beyond their normal position. °· Infection. °In some cases, the cause may not be known. °SYMPTOMS °Symptoms of this condition include: °· An unnatural position of the head. °· Neck pain. °· A limited ability to move the neck. °· Twisting of the neck to one side. °DIAGNOSIS °This condition is diagnosed with a physical exam. You may also have imaging tests, such as an X-ray, CT scan, or MRI. °TREATMENT °Treatment for this condition involves trying to relax the neck muscles. It may include: °· Medicines or shots. °· Physical therapy. °· Surgery. This may be done in severe cases. °HOME CARE INSTRUCTIONS °· Take medicines only as directed by your health care provider. °· Do stretching exercises and massage your neck as directed by your health care provider. °· Keep all follow-up visits as directed by your health care provider. This is important. °SEEK MEDICAL CARE IF: °· You develop a fever. °SEEK IMMEDIATE MEDICAL CARE IF: °· You develop difficulty breathing. °· You develop noisy breathing (stridor). °· You start drooling. °· You have trouble swallowing or have pain with swallowing. °· You develop numbness or weakness in your hands or feet. °· You have changes in your speech, understanding, or vision. °· Your pain gets worse. °  °This information is not intended to replace advice given to you by your health care provider. Make sure you discuss any questions you have with your health care provider. °  °Document Released: 10/14/2000 Document Revised:  03/03/2015 Document Reviewed: 10/13/2014 °Elsevier Interactive Patient Education ©2016 Elsevier Inc. ° °

## 2016-03-23 NOTE — Progress Notes (Signed)
Pre visit review using our clinic review tool, if applicable. No additional management support is needed unless otherwise documented below in the visit note. 

## 2016-03-24 NOTE — Progress Notes (Signed)
Subjective:  Patient ID: Danielle Cook, female    DOB: 09-16-1976  Age: 40 y.o. MRN: [id number]  CC: Neck Pain   HPI DOTTI BUSEY presents for a 3 week history of left-sided neck pain. She denies any specific trauma or injury and tells me that she has been seen in an urgent care center 3 times and once in an emergency room in [place]. She also had pain in her left shoulder and tells me that her shoulder x-ray was normal. She's had no x-rays of her neck and has not had an MRI or nerve conduction study performed. She complains of the neck pain radiates into her left upper extremity and she has numbness and tingling diffusely in her left upper extremity. She describes having been treated with Kenalog, prednisone, doxycycline, Robaxin, tramadol, Tylenol, and baclofen. She states the best combination to relieve her discomfort has been Robaxin and tramadol.  Outpatient Prescriptions Prior to Visit  Medication Sig Dispense Refill  . escitalopram (LEXAPRO) 10 MG tablet TAKE 1 TABLET (10 MG TOTAL) BY MOUTH DAILY. 90 tablet 3  . levonorgestrel (MIRENA, 52 MG,) 20 MCG/24HR IUD 1 Intra Uterine Device (1 each total) by Intrauterine route once. 1 each 0  . loratadine (CLARITIN) 10 MG tablet Take 10 mg by mouth every morning.      . Multiple Vitamins-Minerals (WOMENS MULTIVITAMIN PLUS) TABS Take by mouth.    . oseltamivir (TAMIFLU) 75 MG capsule Take 1 capsule (75 mg total) by mouth daily. 10 capsule 0   No facility-administered medications prior to visit.    ROS Review of Systems  Constitutional: Negative.  Negative for fever, chills and fatigue.  HENT: Negative.  Negative for congestion, postnasal drip, sinus pressure, sore throat and trouble swallowing.   Eyes: Negative.  Negative for visual disturbance.  Respiratory: Negative.  Negative for cough, choking, chest tightness, shortness of breath and stridor.   Cardiovascular: Negative.  Negative for chest pain, palpitations and leg swelling.    Gastrointestinal: Negative.  Negative for nausea, vomiting, abdominal pain, diarrhea and constipation.  Endocrine: Negative.   Genitourinary: Negative.   Musculoskeletal: Positive for neck pain and neck stiffness. Negative for myalgias, back pain, joint swelling, arthralgias and gait problem.  Skin: Negative.  Negative for color change and rash.  Allergic/Immunologic: Negative.   Neurological: Positive for numbness. Negative for dizziness, syncope, weakness, light-headedness and headaches.  Hematological: Negative.  Negative for adenopathy. Does not bruise/bleed easily.  Psychiatric/Behavioral: Negative.     Objective:  BP 118/78 mmHg  Pulse 68  Temp(Src) 97.8 F (36.6 C) (Oral)  Resp 16  Ht 5\' 4"  (1.626 m)  Wt 218 lb (98.884 kg)  BMI 37.40 kg/m2  SpO2 98%  BP Readings from Last 3 Encounters:  03/23/16 118/78  12/10/15 118/82  08/15/14 104/70    Wt Readings from Last 3 Encounters:  03/23/16 218 lb (98.884 kg)  12/10/15 215 lb 6.4 oz (97.705 kg)  08/15/14 202 lb (91.627 kg)    Physical Exam  Constitutional: She is oriented to person, place, and time. No distress.  HENT:  Mouth/Throat: Oropharynx is clear and moist. No oropharyngeal exudate.  Eyes: Conjunctivae are normal. Right eye exhibits no discharge. Left eye exhibits no discharge. No scleral icterus.  Neck: Normal range of motion. Neck supple. No JVD present. No tracheal deviation present. No thyromegaly present.  Cardiovascular: Normal rate, regular rhythm, normal heart sounds and intact distal pulses.  Exam reveals no gallop and no friction rub.   No murmur heard.  Pulmonary/Chest: Effort normal and breath sounds normal. No stridor. No respiratory distress. She has no wheezes. She has no rales. She exhibits no tenderness.  Abdominal: Soft. Bowel sounds are normal. She exhibits no distension and no mass. There is no tenderness. There is no rebound and no guarding.  Musculoskeletal: Normal range of motion. She  exhibits no edema.       Cervical back: Normal. She exhibits normal range of motion, no tenderness, no bony tenderness, no swelling, no edema, no deformity, no laceration, no pain and no spasm.  Lymphadenopathy:    She has no cervical adenopathy.  Neurological: She is alert and oriented to person, place, and time. She has normal strength. She displays no atrophy, no tremor and normal reflexes. No cranial nerve deficit or sensory deficit. She exhibits normal muscle tone. She displays a negative Romberg sign. She displays no seizure activity. Coordination and gait normal.  Reflex Scores:      Tricep reflexes are 0 on the right side and 0 on the left side.      Bicep reflexes are 0 on the right side and 0 on the left side.      Brachioradialis reflexes are 0 on the right side and 0 on the left side.      Patellar reflexes are 0 on the right side and 0 on the left side.      Achilles reflexes are 0 on the right side and 0 on the left side. Skin: Skin is warm and dry. No rash noted. She is not diaphoretic. No erythema. No pallor.  Vitals reviewed.   Lab Results  Component Value Date   WBC 8.6 12/10/2015   HGB 14.0 12/10/2015   HCT 42.1 12/10/2015   PLT 216.0 12/10/2015   GLUCOSE 92 12/10/2015   CHOL 196 12/10/2015   TRIG 105.0 12/10/2015   HDL 44.20 12/10/2015   LDLCALC 131* 12/10/2015   ALT 19 12/10/2015   AST 23 12/10/2015   NA 140 12/10/2015   K 4.5 12/10/2015   CL 104 12/10/2015   CREATININE 0.53 12/10/2015   BUN 10 12/10/2015   CO2 31 12/10/2015   TSH 0.74 12/10/2015    No results found.  Assessment & Plan:   Beatris was seen today for neck pain.  Diagnoses and all orders for this visit:  Radiculitis of left cervical region- she will continue tramadol and Robaxin for symptom relief, I've asked her to have an MRI of her cervical spine to screen for tumor/mass/spinal stenosis/nerve impingement. -     MR Cervical Spine Wo Contrast; Future -     Ambulatory referral to  Neurology -     methocarbamol (ROBAXIN) 500 MG tablet; Take 1 tablet (500 mg total) by mouth every 8 (eight) hours as needed for muscle spasms. -     traMADol (ULTRAM) 50 MG tablet; Take 1 tablet (50 mg total) by mouth every 6 (six) hours as needed.  Numbness and tingling in left arm- I would MRI of her cervical spine to see if she has a central or peripheral lesion and have also recommended that she have a /NCSEMG done to confirm the presence or absence of neurological disorder -     MR Cervical Spine Wo Contrast; Future -     Ambulatory referral to Neurology   I have discontinued Ms. Gagen's oseltamivir, baclofen, and predniSONE. I have also changed her methocarbamol and traMADol. Additionally, I am having her maintain her loratadine, WOMENS MULTIVITAMIN PLUS, escitalopram, and levonorgestrel.  Meds  ordered this encounter  Medications  . DISCONTD: baclofen (LIORESAL) 10 MG tablet    Sig: TK 1 T PO TID    Refill:  0  . DISCONTD: methocarbamol (ROBAXIN) 500 MG tablet    Sig: Take 500 mg by mouth.  . DISCONTD: predniSONE (DELTASONE) 20 MG tablet    Sig:     Refill:  0  . DISCONTD: traMADol (ULTRAM) 50 MG tablet    Sig: TK 1 T PO Q 6 H PRN P FOR UP TO 7 DAYS    Refill:  0  . methocarbamol (ROBAXIN) 500 MG tablet    Sig: Take 1 tablet (500 mg total) by mouth every 8 (eight) hours as needed for muscle spasms.    Dispense:  90 tablet    Refill:  1  . traMADol (ULTRAM) 50 MG tablet    Sig: Take 1 tablet (50 mg total) by mouth every 6 (six) hours as needed.    Dispense:  90 tablet    Refill:  1     Follow-up: Return in about 2 months (around 05/23/2016).  Sanda Lingerhomas Xitlalli Newhard, MD

## 2016-04-05 ENCOUNTER — Ambulatory Visit (INDEPENDENT_AMBULATORY_CARE_PROVIDER_SITE_OTHER): Payer: Commercial Managed Care - PPO | Admitting: Neurology

## 2016-04-05 DIAGNOSIS — R202 Paresthesia of skin: Secondary | ICD-10-CM | POA: Diagnosis not present

## 2016-04-05 DIAGNOSIS — R200 Anesthesia of skin: Secondary | ICD-10-CM

## 2016-04-05 NOTE — Procedures (Signed)
[facility]  [address], [address]  [place], [place] [address] Tel: [phone] Fax:  [phone] Test Date:  04/05/2016  Patient: Danielle FreestoneBonnie Fjeld DOB: 1976/08/31 Physician: Nita Sickleonika Patel, DO  Sex: Female Height: 5\' 3"  Ref Phys: Sanda Lingerhomas Jones, M.D.  ID#: [id number] Temp: 34.3C Technician: Judie PetitM. Dean   Patient Complaints: This is a 40 year old female referred for evaluation of left sided neck pain radiating into her arm.  NCV & EMG Findings: Extensive electrodiagnostic testing of the left upper extremity shows: 1. Left median, ulnar, and palmar sensory responses are within normal limits. 2. Left median and ulnar motor responses are within normal limits. 3. There is no evidence of active or chronic motor axon loss changes affecting any of the tested muscles. Motor unit configuration and recruitment pattern is within normal limits.  Impression: This is a normal study of the left upper extremity. In particular, there is no evidence of cervical radiculopathy or carpal tunnel syndrome.   ___________________________ Nita Sickleonika Patel, DO    Nerve Conduction Studies Anti Sensory Summary Table   Site NR Peak (ms) Norm Peak (ms) P-T Amp (V) Norm P-T Amp  Left Median Anti Sensory (2nd Digit)  34.3C  Wrist    3.3 <3.4 49.3 >20  Left Ulnar Anti Sensory (5th Digit)  34.3C  Wrist    2.5 <3.1 55.0 >12   Motor Summary Table   Site NR Onset (ms) Norm Onset (ms) O-P Amp (mV) Norm O-P Amp Site1 Site2 Delta-0 (ms) Dist (cm) Vel (m/s) Norm Vel (m/s)  Left Median Motor (Abd Poll Brev)  34.3C  Wrist    3.5 <3.9 8.5 >6 Elbow Wrist 3.8 22.0 58 >50  Elbow    7.3  8.2         Left Ulnar Motor (Abd Dig Minimi)  34.3C  Wrist    2.3 <3.1 7.2 >7 B Elbow Wrist 3.2 19.0 59 >50  B Elbow    5.5  7.1  A Elbow B Elbow 1.6 10.0 63 >50  A Elbow    7.1  7.1          Comparison Summary Table   Site NR Peak (ms) Norm Peak (ms) P-T Amp (V) Site1 Site2 Delta-P (ms) Norm Delta (ms)  Left  Median/Ulnar Palm Comparison (Wrist - 8cm)  34.3C  Median Palm    2.1 <2.2 85.1 Median Palm Ulnar Palm 0.1   Ulnar Palm    2.0 <2.2 17.6       EMG   Side Muscle Ins Act Fibs Psw Fasc Number Recrt Dur Dur. Amp Amp. Poly Poly. Comment  Left 1stDorInt Nml Nml Nml Nml Nml Nml Nml Nml Nml Nml Nml Nml N/A  Left Ext Indicis Nml Nml Nml Nml Nml Nml Nml Nml Nml Nml Nml Nml N/A  Left PronatorTeres Nml Nml Nml Nml Nml Nml Nml Nml Nml Nml Nml Nml N/A  Left Biceps Nml Nml Nml Nml Nml Nml Nml Nml Nml Nml Nml Nml N/A  Left Triceps Nml Nml Nml Nml Nml Nml Nml Nml Nml Nml Nml Nml N/A  Left Deltoid Nml Nml Nml Nml Nml Nml Nml Nml Nml Nml Nml Nml N/A  Left Cervical Parasp Low Nml Nml Nml Nml Nml Nml Nml Nml Nml Nml Nml Nml N/A      Waveforms:

## 2016-04-06 ENCOUNTER — Encounter: Payer: Self-pay | Admitting: Internal Medicine

## 2016-04-07 ENCOUNTER — Inpatient Hospital Stay: Admission: RE | Admit: 2016-04-07 | Payer: Commercial Managed Care - PPO | Source: Ambulatory Visit

## 2016-04-18 ENCOUNTER — Telehealth: Payer: Self-pay | Admitting: Internal Medicine

## 2016-04-18 ENCOUNTER — Encounter: Payer: Self-pay | Admitting: Internal Medicine

## 2016-04-18 NOTE — Telephone Encounter (Signed)
[facility] - Client TELEPHONE ADVICE RECORD [facility]  Patient Name: Burman FreestoneBONNIE Pearse  DOB: 1976-02-02    Initial Comment Caller states c/o sore throat, headache, runny and lower back pain. Son tested positive for strep throat.   Nurse Assessment  Nurse: Dorthula RuePatten, RN, Enrique SackKendra Date/Time (Eastern Time): 04/18/2016 11:18:37 AM  Confirm and document reason for call. If symptomatic, describe symptoms. You must click the next button to save text entered. ---Caller states her son tested positive for strep. She has sore throat and headache. She states [facility] instead of waiting for call back and got checked and it was negative. She states she was diagnosed with vial infection. She denies need for triage since she was just seen and will call us back if needed or she gets worse  Has the patient traveled out of the country within the last 30 days? ---Not Applicable  Does the patient have any new or worsening symptoms? ---Yes  Will a triage be completed? ---No  Select reason for no triage. ---Patient declined     Guidelines    Guideline Title Affirmed Question Affirmed Notes       Final Disposition User        Comments  1st attempt was made at 1039, forgot to chart it, message was left

## 2016-05-09 ENCOUNTER — Ambulatory Visit: Payer: Commercial Managed Care - PPO | Admitting: Neurology

## 2016-10-12 ENCOUNTER — Other Ambulatory Visit: Payer: Self-pay | Admitting: Internal Medicine

## 2016-10-12 ENCOUNTER — Encounter: Payer: Self-pay | Admitting: Internal Medicine

## 2016-10-12 MED ORDER — FLUCONAZOLE 150 MG PO TABS
150.0000 mg | ORAL_TABLET | Freq: Once | ORAL | 3 refills | Status: AC
Start: 2016-10-12 — End: 2016-10-12

## 2016-10-18 ENCOUNTER — Encounter: Payer: Self-pay | Admitting: Internal Medicine

## 2016-12-13 ENCOUNTER — Ambulatory Visit (INDEPENDENT_AMBULATORY_CARE_PROVIDER_SITE_OTHER): Payer: Commercial Managed Care - PPO | Admitting: Internal Medicine

## 2016-12-13 ENCOUNTER — Other Ambulatory Visit (INDEPENDENT_AMBULATORY_CARE_PROVIDER_SITE_OTHER): Payer: Commercial Managed Care - PPO

## 2016-12-13 ENCOUNTER — Other Ambulatory Visit (HOSPITAL_COMMUNITY)
Admission: RE | Admit: 2016-12-13 | Discharge: 2016-12-13 | Disposition: A | Discharge status: Home / Self Care | Payer: Commercial Managed Care - PPO | Source: Ambulatory Visit | Attending: Internal Medicine | Admitting: Internal Medicine

## 2016-12-13 ENCOUNTER — Encounter: Payer: Self-pay | Admitting: Internal Medicine

## 2016-12-13 VITALS — BP 128/68 | HR 68 | Temp 98.0°F | Resp 16 | Ht 64.0 in | Wt 214.5 lb

## 2016-12-13 DIAGNOSIS — Z124 Encounter for screening for malignant neoplasm of cervix: Secondary | ICD-10-CM

## 2016-12-13 DIAGNOSIS — Z113 Encounter for screening for infections with a predominantly sexual mode of transmission: Secondary | ICD-10-CM | POA: Diagnosis present

## 2016-12-13 DIAGNOSIS — R072 Precordial pain: Secondary | ICD-10-CM

## 2016-12-13 DIAGNOSIS — G8929 Other chronic pain: Secondary | ICD-10-CM

## 2016-12-13 DIAGNOSIS — Z01419 Encounter for gynecological examination (general) (routine) without abnormal findings: Secondary | ICD-10-CM | POA: Diagnosis present

## 2016-12-13 DIAGNOSIS — M25551 Pain in right hip: Secondary | ICD-10-CM | POA: Diagnosis not present

## 2016-12-13 DIAGNOSIS — Z0000 Encounter for general adult medical examination without abnormal findings: Secondary | ICD-10-CM | POA: Diagnosis not present

## 2016-12-13 DIAGNOSIS — F418 Other specified anxiety disorders: Secondary | ICD-10-CM

## 2016-12-13 DIAGNOSIS — Z1151 Encounter for screening for human papillomavirus (HPV): Secondary | ICD-10-CM | POA: Diagnosis not present

## 2016-12-13 DIAGNOSIS — N898 Other specified noninflammatory disorders of vagina: Secondary | ICD-10-CM

## 2016-12-13 DIAGNOSIS — Z8249 Family history of ischemic heart disease and other diseases of the circulatory system: Secondary | ICD-10-CM

## 2016-12-13 DIAGNOSIS — R0609 Other forms of dyspnea: Secondary | ICD-10-CM

## 2016-12-13 DIAGNOSIS — Z1231 Encounter for screening mammogram for malignant neoplasm of breast: Secondary | ICD-10-CM | POA: Insufficient documentation

## 2016-12-13 DIAGNOSIS — R079 Chest pain, unspecified: Secondary | ICD-10-CM | POA: Insufficient documentation

## 2016-12-13 LAB — WET PREP, GENITAL
CLUE CELLS WET PREP: NONE SEEN — AB
TRICH WET PREP: NONE SEEN — AB
Yeast Wet Prep HPF POC: NONE SEEN — AB

## 2016-12-13 LAB — BRAIN NATRIURETIC PEPTIDE: PRO B NATRI PEPTIDE: 29 pg/mL (ref 0.0–100.0)

## 2016-12-13 LAB — HM PAP SMEAR

## 2016-12-13 LAB — D-DIMER, QUANTITATIVE: D-Dimer, Quant: 0.37 mcg/mL FEU (ref <0.50)

## 2016-12-13 MED ORDER — ESCITALOPRAM OXALATE 10 MG PO TABS: ORAL_TABLET | ORAL | 3 refills | Status: DC

## 2016-12-13 NOTE — Patient Instructions (Signed)
Preventive Care 18-39 Years, Female Preventive care refers to lifestyle choices and visits with your health care provider that can promote health and wellness. What does preventive care include?  A yearly physical exam. This is also called an annual well check.  Dental exams once or twice a year.  Routine eye exams. Ask your health care provider how often you should have your eyes checked.  Personal lifestyle choices, including:  Daily care of your teeth and gums.  Regular physical activity.  Eating a healthy diet.  Avoiding tobacco and drug use.  Limiting alcohol use.  Practicing safe sex.  Taking vitamin and mineral supplements as recommended by your health care provider. What happens during an annual well check? The services and screenings done by your health care provider during your annual well check will depend on your age, overall health, lifestyle risk factors, and family history of disease. Counseling  Your health care provider may ask you questions about your:  Alcohol use.  Tobacco use.  Drug use.  Emotional well-being.  Home and relationship well-being.  Sexual activity.  Eating habits.  Work and work environment.  Method of birth control.  Menstrual cycle.  Pregnancy history. Screening  You may have the following tests or measurements:  Height, weight, and BMI.  Diabetes screening. This is done by checking your blood sugar (glucose) after you have not eaten for a while (fasting).  Blood pressure.  Lipid and cholesterol levels. These may be checked every 5 years starting at age 20.  Skin check.  Hepatitis C blood test.  Hepatitis B blood test.  Sexually transmitted disease (STD) testing.  BRCA-related cancer screening. This may be done if you have a family history of breast, ovarian, tubal, or peritoneal cancers.  Pelvic exam and Pap test. This may be done every 3 years starting at age 21. Starting at age 30, this may be done every 5  years if you have a Pap test in combination with an HPV test. Discuss your test results, treatment options, and if necessary, the need for more tests with your health care provider. Vaccines  Your health care provider may recommend certain vaccines, such as:  Influenza vaccine. This is recommended every year.  Tetanus, diphtheria, and acellular pertussis (Tdap, Td) vaccine. You may need a Td booster every 10 years.  Varicella vaccine. You may need this if you have not been vaccinated.  HPV vaccine. If you are 26 or younger, you may need three doses over 6 months.  Measles, mumps, and rubella (MMR) vaccine. You may need at least one dose of MMR. You may also need a second dose.  Pneumococcal 13-valent conjugate (PCV13) vaccine. You may need this if you have certain conditions and were not previously vaccinated.  Pneumococcal polysaccharide (PPSV23) vaccine. You may need one or two doses if you smoke cigarettes or if you have certain conditions.  Meningococcal vaccine. One dose is recommended if you are age 19-21 years and a first-year college student living in a residence hall, or if you have one of several medical conditions. You may also need additional booster doses.  Hepatitis A vaccine. You may need this if you have certain conditions or if you travel or work in places where you may be exposed to hepatitis A.  Hepatitis B vaccine. You may need this if you have certain conditions or if you travel or work in places where you may be exposed to hepatitis B.  Haemophilus influenzae type b (Hib) vaccine. You may need this   if you have certain risk factors. Talk to your health care provider about which screenings and vaccines you need and how often you need them. This information is not intended to replace advice given to you by your health care provider. Make sure you discuss any questions you have with your health care provider. Document Released: 12/13/2001 Document Revised: 07/06/2016  Document Reviewed: 08/18/2015 Elsevier Interactive Patient Education  2017 [employer].

## 2016-12-13 NOTE — Progress Notes (Signed)
Subjective:  Patient ID: Danielle Cook, female    DOB: 1976-10-28  Age: 41 y.o. MRN: [id number]  CC: Annual Exam   HPI Danielle Cook presents for a CPX.  She complains of chronic right hip pain and chronic right calf pain. She has also had a few episodes of fatigue and dyspnea on exertion. She has had a few episodes of sharp left lower chest wall pain that she isolates to her breast. She has no chest pain with exertion. She is concerned because her father died from an MI at 33 years of age.  Outpatient Medications Prior to Visit  Medication Sig Dispense Refill  . levonorgestrel (MIRENA, 52 MG,) 20 MCG/24HR IUD 1 Intra Uterine Device (1 each total) by Intrauterine route once. 1 each 0  . loratadine (CLARITIN) 10 MG tablet Take 10 mg by mouth every morning.      . escitalopram (LEXAPRO) 10 MG tablet TAKE 1 TABLET (10 MG TOTAL) BY MOUTH DAILY. 90 tablet 3   No facility-administered medications prior to visit.     ROS Review of Systems  Constitutional: Positive for fatigue. Negative for activity change, appetite change, chills, diaphoresis, fever and unexpected weight change.  HENT: Negative.   Eyes: Negative.   Respiratory: Negative.  Negative for cough, choking, chest tightness, shortness of breath, wheezing and stridor.   Cardiovascular: Negative for chest pain, palpitations and leg swelling.  Gastrointestinal: Negative.  Negative for abdominal pain, constipation, diarrhea, nausea and vomiting.  Endocrine: Negative.   Genitourinary: Negative.  Negative for decreased urine volume, difficulty urinating, dysuria, frequency, hematuria, menstrual problem, pelvic pain, urgency, vaginal bleeding and vaginal discharge.  Musculoskeletal: Positive for arthralgias. Negative for back pain, myalgias and neck pain.  Skin: Negative.   Allergic/Immunologic: Negative.   Neurological: Negative.  Negative for dizziness, tremors, weakness and headaches.  Hematological: Negative for adenopathy.  Does not bruise/bleed easily.  Psychiatric/Behavioral: Negative.     Objective:  BP 128/68 (BP Location: Left Arm, Patient Position: Sitting, Cuff Size: Normal)   Pulse 68   Temp 98 F (36.7 C) (Oral)   Resp 16   Ht 5\' 4"  (1.626 m)   Wt 214 lb 8 oz (97.3 kg)   SpO2 98%   BMI 36.82 kg/m   BP Readings from Last 3 Encounters:  12/13/16 128/68  03/23/16 118/78  12/10/15 118/82    Wt Readings from Last 3 Encounters:  12/13/16 214 lb 8 oz (97.3 kg)  03/23/16 218 lb (98.9 kg)  12/10/15 215 lb 6.4 oz (97.7 kg)    Physical Exam  Constitutional: She appears well-developed and well-nourished. No distress.  HENT:  Mouth/Throat: Oropharynx is clear and moist. No oropharyngeal exudate.  Eyes: Conjunctivae are normal. Right eye exhibits no discharge. Left eye exhibits no discharge. No scleral icterus.  Neck: Normal range of motion. Neck supple. No JVD present. No tracheal deviation present. No thyromegaly present.  Cardiovascular: Normal rate, regular rhythm, normal heart sounds and intact distal pulses.  Exam reveals no gallop and no friction rub.   No murmur heard. EKG ---  Sinus  Bradycardia  WITHIN NORMAL LIMITS  Pulmonary/Chest: Effort normal and breath sounds normal. No stridor. No respiratory distress. She has no wheezes. She has no rales. She exhibits no tenderness.  Abdominal: Soft. Bowel sounds are normal. She exhibits no distension and no mass. There is no tenderness. There is no rebound and no guarding. Hernia confirmed negative in the right inguinal area and confirmed negative in the left inguinal area.  Genitourinary: Rectum normal and uterus normal. Rectal exam shows no external hemorrhoid, no internal hemorrhoid, no fissure, no mass, no tenderness, anal tone normal and guaiac negative stool. No breast swelling, tenderness, discharge or bleeding. Pelvic exam was performed with patient supine. There is no rash, tenderness, lesion or injury on the right labia. There is no  tenderness, lesion or injury on the left labia. Uterus is not deviated, not fixed and not tender. Cervix exhibits no motion tenderness, no discharge and no friability. Right adnexum displays no mass, no tenderness and no fullness. Left adnexum displays no mass, no tenderness and no fullness. No erythema, tenderness or bleeding in the vagina. No foreign body in the vagina. No signs of injury around the vagina. Vaginal discharge (scant,white discharge) found.  Musculoskeletal:       Right hip: She exhibits bony tenderness (TTP over GT). She exhibits normal range of motion, normal strength, no tenderness, no swelling and no deformity.  Lymphadenopathy:    She has no cervical adenopathy.       Right: No inguinal adenopathy present.       Left: No inguinal adenopathy present.  Skin: She is not diaphoretic.  Vitals reviewed.   Lab Results  Component Value Date   WBC 9.9 12/16/2016   HGB 14.3 12/16/2016   HCT 42.9 12/16/2016   PLT 220.0 12/16/2016   GLUCOSE 93 12/16/2016   CHOL 211 (H) 12/16/2016   TRIG 127.0 12/16/2016   HDL 37.40 (L) 12/16/2016   LDLCALC 148 (H) 12/16/2016   ALT 12 12/16/2016   AST 14 12/16/2016   NA 139 12/16/2016   K 4.0 12/16/2016   CL 105 12/16/2016   CREATININE 0.65 12/16/2016   BUN 10 12/16/2016   CO2 28 12/16/2016   TSH 0.93 12/16/2016    No results found.  Assessment & Plan:   Danielle Cook was seen today for annual exam.  Diagnoses and all orders for this visit:  Routine general medical examination at a health care facility- exam completed, labs ordered and reviewed, vaccines reviewed and updated, Pap completed, patient education material was given. -     Lipid panel; Future -     Comprehensive metabolic panel; Future -     CBC with Differential/Platelet; Future -     TSH; Future  Visit for screening mammogram -     MM DIGITAL SCREENING BILATERAL; Future  Family history of premature CAD- as below -     EXERCISE TOLERANCE TEST; Future  Precordial  pain- her d-dimer is negative for concerns about pulmonary embolus, her EKG is normal, she'll undergo an ETT to screen for coronary artery disease. -     EKG 12-Lead -     EXERCISE TOLERANCE TEST; Future -     D-dimer, quantitative (not at [facility]); Future -     Brain natriuretic peptide; Future  DOE (dyspnea on exertion)- as above -     EKG 12-Lead -     EXERCISE TOLERANCE TEST; Future -     D-dimer, quantitative ([facility]); Future -     Brain natriuretic peptide; Future  Chronic right hip pain- I think she has trochanteric bursitis and I have asked her to see sports medicine for treatment options -     Ambulatory referral to Sports Medicine  Depression with anxiety -     escitalopram (LEXAPRO) 10 MG tablet; TAKE 1 TABLET (10 MG TOTAL) BY MOUTH DAILY.  Screening for cervical cancer -     Cytology - PAP  Vaginal discharge- wet prep and Pap are normal, this is a normal vaginal excretion. -     Wet prep, genital; Future   I am having Ms. Thomaston maintain her loratadine, levonorgestrel, and escitalopram.  Meds ordered this encounter  Medications  . escitalopram (LEXAPRO) 10 MG tablet    Sig: TAKE 1 TABLET (10 MG TOTAL) BY MOUTH DAILY.    Dispense:  90 tablet    Refill:  3     Follow-up: Return in about 3 months (around 03/12/2017).  Sanda Linger, MD

## 2016-12-13 NOTE — Progress Notes (Signed)
Pre visit review using our clinic review tool, if applicable. No additional management support is needed unless otherwise documented below in the visit note. 

## 2016-12-14 ENCOUNTER — Encounter: Payer: Self-pay | Admitting: Internal Medicine

## 2016-12-14 LAB — CYTOLOGY - PAP
Chlamydia: NEGATIVE
DIAGNOSIS: NEGATIVE
HPV: NOT DETECTED
Neisseria Gonorrhea: NEGATIVE

## 2016-12-15 ENCOUNTER — Other Ambulatory Visit: Payer: Self-pay | Admitting: Internal Medicine

## 2016-12-15 ENCOUNTER — Encounter: Payer: Self-pay | Admitting: Internal Medicine

## 2016-12-16 ENCOUNTER — Other Ambulatory Visit (INDEPENDENT_AMBULATORY_CARE_PROVIDER_SITE_OTHER): Payer: Commercial Managed Care - PPO

## 2016-12-16 DIAGNOSIS — Z0000 Encounter for general adult medical examination without abnormal findings: Secondary | ICD-10-CM | POA: Diagnosis not present

## 2016-12-16 LAB — COMPREHENSIVE METABOLIC PANEL
ALBUMIN: 3.9 g/dL (ref 3.5–5.2)
ALK PHOS: 72 U/L (ref 39–117)
ALT: 12 U/L (ref 0–35)
AST: 14 U/L (ref 0–37)
BUN: 10 mg/dL (ref 6–23)
CO2: 28 mEq/L (ref 19–32)
CREATININE: 0.65 mg/dL (ref 0.40–1.20)
Calcium: 9.2 mg/dL (ref 8.4–10.5)
Chloride: 105 mEq/L (ref 96–112)
GFR: 106.76 mL/min (ref >60.00)
Glucose, Bld: 93 mg/dL (ref 70–99)
Potassium: 4 mEq/L (ref 3.5–5.1)
SODIUM: 139 meq/L (ref 135–145)
TOTAL PROTEIN: 6.7 g/dL (ref 6.0–8.3)
Total Bilirubin: 0.4 mg/dL (ref 0.2–1.2)

## 2016-12-16 LAB — CBC WITH DIFFERENTIAL/PLATELET
BASOS PCT: 0.9 % (ref 0.0–3.0)
Basophils Absolute: 0.1 10*3/uL (ref 0.0–0.1)
EOS ABS: 0.2 10*3/uL (ref 0.0–0.7)
Eosinophils Relative: 2 % (ref 0.0–5.0)
HCT: 42.9 % (ref 36.0–46.0)
HEMOGLOBIN: 14.3 g/dL (ref 12.0–15.0)
Lymphocytes Relative: 20.9 % (ref 12.0–46.0)
Lymphs Abs: 2.1 10*3/uL (ref 0.7–4.0)
MCHC: 33.3 g/dL (ref 30.0–36.0)
MCV: 88.6 fl (ref 78.0–100.0)
MONO ABS: 0.6 10*3/uL (ref 0.1–1.0)
Monocytes Relative: 6.2 % (ref 3.0–12.0)
NEUTROS PCT: 70 % (ref 43.0–77.0)
Neutro Abs: 6.9 10*3/uL (ref 1.4–7.7)
Platelets: 220 10*3/uL (ref 150.0–400.0)
RBC: 4.85 Mil/uL (ref 3.87–5.11)
RDW: 13.4 % (ref 11.5–15.5)
WBC: 9.9 10*3/uL (ref 4.0–10.5)

## 2016-12-16 LAB — LIPID PANEL
CHOL/HDL RATIO: 6
Cholesterol: 211 mg/dL — ABNORMAL HIGH (ref 0–200)
HDL: 37.4 mg/dL — ABNORMAL LOW (ref >39.00)
LDL Cholesterol: 148 mg/dL — ABNORMAL HIGH (ref 0–99)
NonHDL: 173.54
TRIGLYCERIDES: 127 mg/dL (ref 0.0–149.0)
VLDL: 25.4 mg/dL (ref 0.0–40.0)

## 2016-12-16 LAB — TSH: TSH: 0.93 u[IU]/mL (ref 0.35–4.50)

## 2016-12-17 ENCOUNTER — Encounter: Payer: Self-pay | Admitting: Internal Medicine

## 2016-12-22 ENCOUNTER — Encounter: Payer: Self-pay | Admitting: Internal Medicine

## 2016-12-22 ENCOUNTER — Ambulatory Visit (INDEPENDENT_AMBULATORY_CARE_PROVIDER_SITE_OTHER): Payer: Commercial Managed Care - PPO

## 2016-12-22 DIAGNOSIS — Z8249 Family history of ischemic heart disease and other diseases of the circulatory system: Secondary | ICD-10-CM

## 2016-12-22 DIAGNOSIS — R0609 Other forms of dyspnea: Secondary | ICD-10-CM | POA: Diagnosis not present

## 2016-12-22 DIAGNOSIS — R072 Precordial pain: Secondary | ICD-10-CM | POA: Diagnosis not present

## 2016-12-22 LAB — EXERCISE TOLERANCE TEST
CHL CUP MPHR: 179 {beats}/min
CSEPED: 9 min
CSEPHR: 92 %
CSEPPHR: 166 {beats}/min
Estimated workload: 10.1 METS
Exercise duration (sec): 0 s
RPE: 17
Rest HR: 65 {beats}/min

## 2016-12-28 ENCOUNTER — Other Ambulatory Visit: Payer: Self-pay | Admitting: Internal Medicine

## 2017-01-03 ENCOUNTER — Ambulatory Visit: Payer: Commercial Managed Care - PPO | Admitting: Family Medicine

## 2017-01-05 ENCOUNTER — Ambulatory Visit: Payer: Commercial Managed Care - PPO | Admitting: Family Medicine

## 2017-02-05 NOTE — Progress Notes (Signed)
[name] [address] [place], [place] [address] Phone: [phone] Subjective:    I'm seeing this patient by the request  of:  Sanda Linger, MD   CC: Chronic hip pain  [id number]  Danielle Cook is a 41 y.o. female coming in with complaint of chronic hip pain. Been going on for 10 months, fell when roller blading. Hurts with movement and lays on it.  Rates severity 7/10. Patient states that unfortunately it seems to be worse after sitting for long amount of time and does get better with exercise. Patient has not been able exercises regularly due to this as well as some discomfort in the lower back. Patient states that there may be some association. No radiation down the leg. No weakness. No numbness.        Past Medical History:  Diagnosis Date  . Abnormal Pap smear   . Allergy   . History of chicken pox   . 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 Hyperlipidemia   . Migraine   . PPD positive, treated 2004   Past Surgical History:  Procedure Laterality Date  . CHOLECYSTECTOMY, LAPAROSCOPIC  07/13/2000  . COLPOSCOPY  2000,   . TONSILLECTOMY    . WISDOM TOOTH EXTRACTION  1995 Jan   Social History   Social History  . Marital status: Married    Spouse name: N/A  . Number of children: N/A  . Years of education: N/A   Social History Main Topics  . Smoking status: Former Smoker    Packs/day: 0.25    Years: 15.00    Types: Cigarettes  . Smokeless tobacco: Never Used  . Alcohol use No     Comment: few glasses of wine during pregnancy  . Drug use: No  . Sexual activity: Yes   Other Topics Concern  . None   Social History Narrative  . None   Allergies  Allergen Reactions  . Eggs Or Egg-Derived Products   . Guaifenesin Er Anxiety and Palpitations  . Latex Hives and Rash  . Wellbutrin [Bupropion Hcl] Anxiety and Palpitations   Family History  Problem Relation Age of Onset  . Stroke Mother   . Dementia Mother   . Hyperlipidemia Mother    . Early death Father   . Heart disease Father   . Hyperlipidemia Father   . Cancer Neg Hx   . Depression Neg Hx   . Diabetes Neg Hx   . Drug abuse Neg Hx   . Hearing loss Neg Hx   . Hypertension Neg Hx     Past medical history, social, surgical and family history all reviewed in electronic medical record.  No pertanent information unless stated regarding to the chief complaint.   Review of Systems:Review of systems updated and as accurate as of 02/06/17  No headache, visual changes, nausea, vomiting, diarrhea, constipation, dizziness, abdominal pain, skin rash, fevers, chills, night sweats, weight loss, swollen lymph nodes, body aches, joint swelling,  chest pain, shortness of breath, mood changes. Positive muscle aches  Objective  Blood pressure 112/78, pulse (!) 57, resp. rate 16, weight 223 lb (101.2 kg), SpO2 98 %. Systems examined below as of 02/06/17   General: No apparent distress alert and oriented x3 mood and affect normal, dressed appropriately.  HEENT: Pupils equal, extraocular movements intact  Respiratory: Patient's speak in full sentences and does not appear short of breath  Cardiovascular: No lower extremity edema, non tender, no erythema  Skin: Warm  dry intact with no signs of infection or rash on extremities or on axial skeleton.  Abdomen: Soft nontender  Obese with poor core strength  Neuro: Cranial nerves II through XII are intact, neurovascularly intact in all extremities with 2+ DTRs and 2+ pulses.  Lymph: No lymphadenopathy of posterior or anterior cervical chain or axillae bilaterally.  Gait normal with good balance and coordination.  MSK:  Non tender with full range of motion and good stability and symmetric strength and tone of shoulders, elbows, wrist,  knee and ankles bilaterally.  Hip: Right ROM IR: 25 Deg, ER: 45 Deg, Flexion: 120 Deg, Extension: 100 Deg, Abduction: 45 Deg, Adduction: 45 Deg Strength IR: 5/5, ER: 5/5, Flexion: 5/5, Extension: 5/5,  Abduction: 5/5, Adduction: 5/5 Pelvic alignment unremarkable to inspection and palpation. Standing hip rotation and gait without trendelenburg sign / unsteadiness. Moderate to severe tenderness over the greater trochanteric area. Mild pain over the left and right sacroiliac joint. No SI joint tenderness and normal minimal SI movement   MSK US performed of: Right hip This study was ordered, performed, and interpreted by Terrilee Files D.O.  Hip: Trochanteric bursa with significant hypoechoic changes and calcific changes noted. Acetabular labrum visualized and without tears, displacement, or effusion in joint. Femoral neck appears unremarkable without increased power doppler signal along Cortex.  IMPRESSION:  Calcific bursitis  97110; 15 minutes spent for Therapeutic exercises as stated in above notes.  This included exercises focusing on stretching, strengthening, with significant focus on eccentric aspects.  Hip strengthening exercises which included:  Pelvic tilt/bracing to help with proper recruitment of the lower abs and pelvic floor muscles  theraband given Glute strengthening to properly contract glutes without over-engaging low back and hamstrings - prone hip extension and glute bridge exercises Proper stretching techniques to increase effectiveness for the hip flexors, groin, quads, piriformic and low back when appropriate    Proper technique shown and discussed handout in great detail with ATC.  All questions were discussed and answered.     Impression and Recommendations:     This case required medical decision making of moderate complexity.      Note: This dictation was prepared with Dragon dictation along with smaller phrase technology. Any transcriptional errors that result from this process are unintentional.

## 2017-02-06 ENCOUNTER — Ambulatory Visit: Payer: Self-pay

## 2017-02-06 ENCOUNTER — Encounter: Payer: Self-pay | Admitting: Family Medicine

## 2017-02-06 ENCOUNTER — Ambulatory Visit (INDEPENDENT_AMBULATORY_CARE_PROVIDER_SITE_OTHER): Payer: Commercial Managed Care - PPO | Admitting: Family Medicine

## 2017-02-06 VITALS — BP 112/78 | HR 57 | Resp 16 | Wt 223.0 lb

## 2017-02-06 DIAGNOSIS — M533 Sacrococcygeal disorders, not elsewhere classified: Secondary | ICD-10-CM | POA: Diagnosis not present

## 2017-02-06 DIAGNOSIS — G8929 Other chronic pain: Secondary | ICD-10-CM

## 2017-02-06 DIAGNOSIS — M7061 Trochanteric bursitis, right hip: Secondary | ICD-10-CM | POA: Diagnosis not present

## 2017-02-06 DIAGNOSIS — M25559 Pain in unspecified hip: Secondary | ICD-10-CM | POA: Diagnosis not present

## 2017-02-06 MED ORDER — GABAPENTIN 100 MG PO CAPS: 200.0000 mg | ORAL_CAPSULE | Freq: Every day | ORAL | 3 refills | Status: DC

## 2017-02-06 MED ORDER — MELOXICAM 15 MG PO TABS
15.0000 mg | ORAL_TABLET | Freq: Every day | ORAL | 0 refills | Status: DC
Start: 2017-02-06 — End: 2017-03-02

## 2017-02-06 MED ORDER — VITAMIN D (ERGOCALCIFEROL) 1.25 MG (50000 UNIT) PO CAPS: 50000.0000 [IU] | ORAL_CAPSULE | ORAL | 0 refills | Status: DC

## 2017-02-06 NOTE — Assessment & Plan Note (Signed)
Patient's pain seems to be more in the greater trochanter bursitis. Worse pain at night though. Has having radicular symptoms of the cervical spine previously and there is a chance for lumbar radiculopathy. Within the differential. Prescriptions as per orders. We discussed home exercises and patient work with Event organiser. We discussed icing regimen. She'll come back and see me again in 3-4 weeks. Could be a candidate for manipulation.

## 2017-02-06 NOTE — Progress Notes (Signed)
Pre-visit discussion using our clinic review tool. No additional management support is needed unless otherwise documented below in the visit note.  

## 2017-02-06 NOTE — Patient Instructions (Addendum)
Good to see you  Danielle Cook is your friend. Ice 20 minutes 2 times daily. Usually after activity and before bed. Once weeklyy vitamin D for 12 weeks.  Meloxicam daily for 10 days then as needed.  pennsaid pinkie amount topically 2 times daily as needed.  Gabapentin  at night Exercises 3 times a week.  See me again in 3-4 weeks.

## 2017-02-07 ENCOUNTER — Ambulatory Visit
Admission: RE | Admit: 2017-02-07 | Discharge: 2017-02-07 | Disposition: A | Discharge status: Home / Self Care | Payer: Commercial Managed Care - PPO | Source: Ambulatory Visit | Attending: Internal Medicine | Admitting: Internal Medicine

## 2017-02-07 DIAGNOSIS — Z1231 Encounter for screening mammogram for malignant neoplasm of breast: Secondary | ICD-10-CM

## 2017-02-07 LAB — HM MAMMOGRAPHY

## 2017-03-02 ENCOUNTER — Other Ambulatory Visit: Payer: Self-pay | Admitting: Family Medicine

## 2017-03-04 NOTE — Progress Notes (Signed)
[name] [address] [place], [place] [address] Phone: [phone] Subjective:    I'm seeing this patient by the request  of:  Etta Grandchild, MD   CC: Chronic hip pain f/u   [id number]  Danielle Cook is a 41 y.o. female coming in with complaint of chronic hip pain. Patient was found to have more of a calcific bursitis of the greater trochanter area. Patient hasn't been doing the conservative therapy. States that she is about 60% better. Still having some mild discomfort. Not as severe. Patient states when she gets up from a seated position doing much better patient's states that at night seems to be more comfortable as well. Not affecting daily activities as much as it was.       Past Medical History:  Diagnosis Date  . Abnormal Pap smear   . Allergy   . History of chicken pox   . 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 Hyperlipidemia   . Migraine   . PPD positive, treated 2004   Past Surgical History:  Procedure Laterality Date  . CHOLECYSTECTOMY, LAPAROSCOPIC  07/13/2000  . COLPOSCOPY  2000,   . TONSILLECTOMY    . WISDOM TOOTH EXTRACTION  1995 Jan   Social History   Social History  . Marital status: Married    Spouse name: N/A  . Number of children: N/A  . Years of education: N/A   Social History Main Topics  . Smoking status: Former Smoker    Packs/day: 0.25    Years: 15.00    Types: Cigarettes  . Smokeless tobacco: Never Used  . Alcohol use No     Comment: few glasses of wine during pregnancy  . Drug use: No  . Sexual activity: Yes   Other Topics Concern  . None   Social History Narrative  . None   Allergies  Allergen Reactions  . Eggs Or Egg-Derived Products   . Guaifenesin Er Anxiety and Palpitations  . Latex Hives and Rash  . Wellbutrin [Bupropion Hcl] Anxiety and Palpitations   Family History  Problem Relation Age of Onset  . Stroke Mother   . Dementia Mother   . Hyperlipidemia Mother   . Early death Father    . Heart disease Father   . Hyperlipidemia Father   . Cancer Neg Hx   . Depression Neg Hx   . Diabetes Neg Hx   . Drug abuse Neg Hx   . Hearing loss Neg Hx   . Hypertension Neg Hx     Past medical history, social, surgical and family history all reviewed in electronic medical record.  No pertanent information unless stated regarding to the chief complaint.   Review of Systems: No headache, visual changes, nausea, vomiting, diarrhea, constipation, dizziness, abdominal pain, skin rash, fevers, chills, night sweats, weight loss, swollen lymph nodes, body aches, joint swelling, muscle aches, chest pain, shortness of breath, mood changes.    Objective  Blood pressure 120/68, pulse 70, height 5\' 4"  (1.626 m), weight 220 lb (99.8 kg).   Systems examined below as of 03/06/17 General: NAD A&O x3 mood, affect normal  HEENT: Pupils equal, extraocular movements intact no nystagmus Respiratory: not short of breath at rest or with speaking Cardiovascular: No lower extremity edema, non tender Skin: Warm dry intact with no signs of infection or rash on extremities or on axial skeleton. Abdomen: Soft nontender, no masses Neuro: Cranial nerves  intact, neurovascularly intact in all extremities with  2+ DTRs and 2+ pulses. Lymph: No lymphadenopathy appreciated today  Gait normal with good balance and coordination.  MSK: Non tender with full range of motion and good stability and symmetric strength and tone of shoulders, elbows, wrist,  knee and ankles bilaterally.    Hip: Right ROM IR: 45 Deg, ER: 45 Deg, Flexion: 120 Deg, Extension: 100 Deg, Abduction: 45 Deg, Adduction: 45 Deg Strength IR: 5/5, ER: 5/5, Flexion: 5/5, Extension: 5/5, Abduction: 5/5, Adduction: 5/5 Pelvic alignment unremarkable to inspection and palpation. Standing hip rotation and gait without trendelenburg sign / unsteadiness. Moderate discomfort over the greater enteric area as well as minorly over the right SI joint.

## 2017-03-06 ENCOUNTER — Encounter: Payer: Self-pay | Admitting: Family Medicine

## 2017-03-06 ENCOUNTER — Ambulatory Visit (INDEPENDENT_AMBULATORY_CARE_PROVIDER_SITE_OTHER): Payer: Commercial Managed Care - PPO | Admitting: Family Medicine

## 2017-03-06 DIAGNOSIS — M7061 Trochanteric bursitis, right hip: Secondary | ICD-10-CM

## 2017-03-06 MED ORDER — MELOXICAM 15 MG PO TABS: 15.0000 mg | ORAL_TABLET | Freq: Every day | ORAL | 3 refills | Status: DC

## 2017-03-06 MED ORDER — VITAMIN D (ERGOCALCIFEROL) 1.25 MG (50000 UNIT) PO CAPS: 50000.0000 [IU] | ORAL_CAPSULE | ORAL | 0 refills | Status: DC

## 2017-03-06 NOTE — Assessment & Plan Note (Signed)
Discussed with patient at great length. Would like to wait another 6 weeks to we will reevaluate. Patient will continue with conservative therapy. Refilled the meloxicam as well as vitamin D. We discussed which activities to do a which was to avoid. Follow-up again in 6 weeks

## 2017-03-06 NOTE — Patient Instructions (Addendum)
Good to see you  Gustavus Bryantce is your friend.  Do my exercises 2-3 times a week  Meloxicam daily for 3-5 days when you are having pain then stop Gabapentin at night as needed Continue the vitamin D See me again in 6 weeks.

## 2017-04-17 ENCOUNTER — Ambulatory Visit (INDEPENDENT_AMBULATORY_CARE_PROVIDER_SITE_OTHER): Payer: Commercial Managed Care - PPO | Admitting: Family Medicine

## 2017-04-17 ENCOUNTER — Encounter: Payer: Self-pay | Admitting: Family Medicine

## 2017-04-17 DIAGNOSIS — G8929 Other chronic pain: Secondary | ICD-10-CM

## 2017-04-17 DIAGNOSIS — M25551 Pain in right hip: Secondary | ICD-10-CM | POA: Diagnosis not present

## 2017-04-17 NOTE — Assessment & Plan Note (Signed)
Doing well at this time. His lungs patient continues to do well we will see her on an as-needed basis. Encourage her to continue conservative therapy.

## 2017-04-17 NOTE — Patient Instructions (Signed)
Good to see you  I am so happy you are doing well  I will refill vitamin D up to 1 year.  Use 1 pill of gabapentin even if you need  Continue my exercises 1-2 times a week.  Meloxicam as well  See me again when you need me

## 2017-04-17 NOTE — Progress Notes (Signed)
[facility] [address] [place], [place] [address] Phone: [phone] Subjective:    I'm seeing this patient by the request  of:  Etta Grandchild, MD   CC: Chronic hip pain f/u   [id number]  Danielle Cook is a 41 y.o. female coming in with complaint of chronic hip pain. Patient was found to have more of a calcific bursitis of the greater trochanter area. Patient at last exam was making significant strides with the conservative therapy including once weekly vitamin D, home exercises, icing regimen. Patient states Doing much better overall. States that she is 80-85% better. Still some mild discomfort. Nothing severe. Doing the home exercises fairly regularly. Denies any radiation down the leg. Taking the gabapentin intermittently at night. Patient is taking the meloxicam only on day she works. Fairly happy with the results.       Past Medical History:  Diagnosis Date  . Abnormal Pap smear   . Allergy   . History of chicken pox   . 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 Hyperlipidemia   . Migraine   . PPD positive, treated 2004   Past Surgical History:  Procedure Laterality Date  . CHOLECYSTECTOMY, LAPAROSCOPIC  07/13/2000  . COLPOSCOPY  2000,   . TONSILLECTOMY    . WISDOM TOOTH EXTRACTION  1995 Jan   Social History   Social History  . Marital status: Married    Spouse name: N/A  . Number of children: N/A  . Years of education: N/A   Social History Main Topics  . Smoking status: Former Smoker    Packs/day: 0.25    Years: 15.00    Types: Cigarettes  . Smokeless tobacco: Never Used  . Alcohol use No     Comment: few glasses of wine during pregnancy  . Drug use: No  . Sexual activity: Yes   Other Topics Concern  . None   Social History Narrative  . None   Allergies  Allergen Reactions  . Eggs Or Egg-Derived Products   . Guaifenesin Er Anxiety and Palpitations  . Latex Hives and Rash  . Wellbutrin [Bupropion Hcl] Anxiety and  Palpitations   Family History  Problem Relation Age of Onset  . Stroke Mother   . Dementia Mother   . Hyperlipidemia Mother   . Early death Father   . Heart disease Father   . Hyperlipidemia Father   . Cancer Neg Hx   . Depression Neg Hx   . Diabetes Neg Hx   . Drug abuse Neg Hx   . Hearing loss Neg Hx   . Hypertension Neg Hx     Past medical history, social, surgical and family history all reviewed in electronic medical record.  No pertanent information unless stated regarding to the chief complaint.   Review of Systems: No headache, visual changes, nausea, vomiting, diarrhea, constipation, dizziness, abdominal pain, skin rash, fevers, chills, night sweats, weight loss, swollen lymph nodes, body aches, joint swelling, muscle aches, chest pain, shortness of breath, mood changes.     Objective  Blood pressure 114/80, pulse 71, height 5\' 4"  (1.626 m), weight 226 lb (102.5 kg), SpO2 99 %.   Systems examined below as of 04/17/17 General: NAD A&O x3 mood, affect normal  HEENT: Pupils equal, extraocular movements intact no nystagmus Respiratory: not short of breath at rest or with speaking Cardiovascular: No lower extremity edema, non tender Skin: Warm dry intact with no signs of infection or rash on extremities  or on axial skeleton. Abdomen: Soft nontender, no masses Neuro: Cranial nerves  intact, neurovascularly intact in all extremities with 2+ DTRs and 2+ pulses. Lymph: No lymphadenopathy appreciated today  Gait normal with good balance and coordination.  MSK: Non tender with full range of motion and good stability and symmetric strength and tone of shoulders, elbows, wrist,  knee and ankles bilaterally.      Hip: Right ROM IR: 45 Deg, ER: 20 Deg, Flexion: 120 Deg, Extension: 100 Deg, Abduction: 45 Deg, Adduction: 25 Deg Strength IR: 5/5, ER: 5/5, Flexion: 5/5, Extension: 5/5, Abduction: 5/5, Adduction: 4/5 Pelvic alignment unremarkable to inspection and palpation. Standing  hip rotation and gait without trendelenburg sign / unsteadiness. Minimal discomfort over the greater trochanter area

## 2017-05-26 ENCOUNTER — Other Ambulatory Visit: Payer: Self-pay | Admitting: Family Medicine

## 2017-05-26 NOTE — Telephone Encounter (Signed)
Refill done.  

## 2017-05-30 ENCOUNTER — Encounter: Payer: Self-pay | Admitting: Internal Medicine

## 2017-05-30 ENCOUNTER — Other Ambulatory Visit: Payer: Self-pay | Admitting: Internal Medicine

## 2017-05-30 DIAGNOSIS — F418 Other specified anxiety disorders: Secondary | ICD-10-CM

## 2017-05-30 MED ORDER — ESCITALOPRAM OXALATE 5 MG PO TABS: 5.0000 mg | ORAL_TABLET | Freq: Every day | ORAL | 1 refills | Status: DC

## 2017-05-30 MED ORDER — ESCITALOPRAM OXALATE 10 MG PO TABS: 10.0000 mg | ORAL_TABLET | Freq: Every day | ORAL | 3 refills | Status: DC

## 2017-07-31 HISTORY — PX: EYE SURGERY: SHX253

## 2017-10-12 ENCOUNTER — Other Ambulatory Visit: Payer: Self-pay | Admitting: Family Medicine

## 2017-11-22 ENCOUNTER — Other Ambulatory Visit: Payer: Self-pay | Admitting: Family Medicine

## 2017-11-22 ENCOUNTER — Other Ambulatory Visit: Payer: Self-pay | Admitting: Internal Medicine

## 2017-11-22 DIAGNOSIS — F418 Other specified anxiety disorders: Secondary | ICD-10-CM

## 2017-11-22 NOTE — Telephone Encounter (Signed)
Refill done.  

## 2018-01-04 ENCOUNTER — Other Ambulatory Visit: Payer: Self-pay | Admitting: Family Medicine

## 2018-01-05 ENCOUNTER — Encounter: Payer: Self-pay | Admitting: Family Medicine

## 2018-01-05 ENCOUNTER — Other Ambulatory Visit: Payer: Self-pay | Admitting: Family

## 2018-01-05 ENCOUNTER — Encounter: Payer: Self-pay | Admitting: Internal Medicine

## 2018-01-05 MED ORDER — FLUCONAZOLE 150 MG PO TABS: 150.0000 mg | ORAL_TABLET | Freq: Once | ORAL | 0 refills | Status: AC

## 2018-01-08 MED ORDER — VITAMIN D (ERGOCALCIFEROL) 1.25 MG (50000 UNIT) PO CAPS: ORAL_CAPSULE | ORAL | 0 refills | Status: DC

## 2018-01-08 MED ORDER — MELOXICAM 15 MG PO TABS: 15.0000 mg | ORAL_TABLET | Freq: Every day | ORAL | 3 refills | Status: DC

## 2018-02-20 ENCOUNTER — Ambulatory Visit: Payer: Commercial Managed Care - PPO | Admitting: Family Medicine

## 2018-06-20 ENCOUNTER — Other Ambulatory Visit: Payer: Self-pay | Admitting: Family Medicine

## 2018-06-28 ENCOUNTER — Other Ambulatory Visit: Payer: Self-pay | Admitting: Internal Medicine

## 2018-06-28 ENCOUNTER — Encounter: Payer: Self-pay | Admitting: Internal Medicine

## 2018-06-28 DIAGNOSIS — F418 Other specified anxiety disorders: Secondary | ICD-10-CM

## 2018-07-05 ENCOUNTER — Other Ambulatory Visit: Payer: Self-pay | Admitting: Family Medicine

## 2018-07-31 ENCOUNTER — Encounter: Payer: Self-pay | Admitting: Internal Medicine

## 2018-08-07 NOTE — Progress Notes (Signed)
[name] [address] [place], [place] [address] Phone: [phone] Subjective:   Bruce Donath, am serving as a scribe for Dr. Antoine Primas.    CC: Left hip pain  [id number]  Danielle Cook is a 42 y.o. female coming in with complaint of left hip pain. Pain over GT. Pain increases when sidelying and with the first steps after performing a squat. She did try to use the stretches from last year which help alleviate her pain. Uses Vitamin D and Mobic.  Patient states that it is bilateral.  Waking her up at night, affecting daily activities.  Sometimes sitting at work and getting up seems to be worsening pain.      Past Medical History:  Diagnosis Date  . Abnormal Pap smear   . Allergy   . History of chicken pox   . 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 Hyperlipidemia   . Migraine   . PPD positive, treated 2004   Past Surgical History:  Procedure Laterality Date  . CHOLECYSTECTOMY, LAPAROSCOPIC  07/13/2000  . COLPOSCOPY  2000,   . TONSILLECTOMY    . WISDOM TOOTH EXTRACTION  1995 Jan   Social History   Socioeconomic History  . Marital status: Married    Spouse name: Not on file  . Number of children: Not on file  . Years of education: Not on file  . Highest education level: Not on file  Occupational History  . Not on file  Social Needs  . Financial resource strain: Not on file  . Food insecurity:    Worry: Not on file    Inability: Not on file  . Transportation needs:    Medical: Not on file    Non-medical: Not on file  Tobacco Use  . Smoking status: Former Smoker    Packs/day: 0.25    Years: 15.00    Pack years: 3.75    Types: Cigarettes  . Smokeless tobacco: Never Used  Substance and Sexual Activity  . Alcohol use: No    Comment: few glasses of wine during pregnancy  . Drug use: No  . Sexual activity: Yes  Lifestyle  . Physical activity:    Days per week: Not on file    Minutes per session: Not on file  . Stress: Not on  file  Relationships  . Social connections:    Talks on phone: Not on file    Gets together: Not on file    Attends religious service: Not on file    Active member of club or organization: Not on file    Attends meetings of clubs or organizations: Not on file    Relationship status: Not on file  Other Topics Concern  . Not on file  Social History Narrative  . Not on file   Allergies  Allergen Reactions  . Eggs Or Egg-Derived Products   . Guaifenesin Er Anxiety and Palpitations  . Latex Hives and Rash  . Wellbutrin [Bupropion Hcl] Anxiety and Palpitations   Family History  Problem Relation Age of Onset  . Stroke Mother   . Dementia Mother   . Hyperlipidemia Mother   . Early death Father   . Heart disease Father   . Hyperlipidemia Father   . Cancer Neg Hx   . Depression Neg Hx   . Diabetes Neg Hx   . Drug abuse Neg Hx   . Hearing loss Neg Hx   . Hypertension Neg Hx  Current Outpatient Medications (Endocrine & Metabolic):  .  levonorgestrel (MIRENA, 52 MG,) 20 MCG/24HR IUD, 1 Intra Uterine Device (1 each total) by Intrauterine route once.   Current Outpatient Medications (Respiratory):  .  loratadine (CLARITIN) 10 MG tablet, Take 10 mg by mouth every morning.    Current Outpatient Medications (Analgesics):  .  meloxicam (MOBIC) 15 MG tablet, Take 1 tablet (15 mg total) by mouth daily.   Current Outpatient Medications (Other):  .  escitalopram (LEXAPRO) 10 MG tablet, TAKE 1 TABLET BY MOUTH EVERY DAY. Marland Kitchen  escitalopram (LEXAPRO) 5 MG tablet, TAKE 1 TABLET BY MOUTH EVERY DAY .  gabapentin (NEURONTIN) 100 MG capsule, Take 2 capsules (200 mg total) by mouth at bedtime. .  Vitamin D, Ergocalciferol, (DRISDOL) 50000 units CAPS capsule, Take 1 capsule (50,000 Units total) by mouth every 7 (seven) days.    Past medical history, social, surgical and family history all reviewed in electronic medical record.  No pertanent information unless stated regarding to the chief  complaint.   Review of Systems:  No headache, visual changes, nausea, vomiting, diarrhea, constipation, dizziness, abdominal pain, skin rash, fevers, chills, night sweats, weight loss, swollen lymph nodes, body aches, joint swelling, , chest pain, shortness of breath, mood changes.  Positive muscle aches  Objective  Blood pressure 128/88, pulse 71, height 5\' 4"  (1.626 m), weight 202 lb (91.6 kg), SpO2 96 %.    General: No apparent distress alert and oriented x3 mood and affect normal, dressed appropriately.  HEENT: Pupils equal, extraocular movements intact  Respiratory: Patient's speak in full sentences and does not appear short of breath  Cardiovascular: No lower extremity edema, non tender, no erythema  Skin: Warm dry intact with no signs of infection or rash on extremities or on axial skeleton.  Abdomen: Soft nontender  Neuro: Cranial nerves II through XII are intact, neurovascularly intact in all extremities with 2+ DTRs and 2+ pulses.  Lymph: No lymphadenopathy of posterior or anterior cervical chain or axillae bilaterally.  Gait normal with good balance and coordination.  MSK:  Non tender with full range of motion and good stability and symmetric strength and tone of shoulders, elbows, wrist,  knee and ankles bilaterally.  Hip: Bilateral ROM IR: 25 Deg, ER: 45 Deg, Flexion: 120 Deg, Extension: 100 Deg, Abduction: 45 Deg, Adduction: 20 Deg Strength IR: 5/5, ER: 5/5, Flexion: 5/5, Extension: 5/5, Abduction: 5/5, Adduction: 4/5 Pelvic alignment unremarkable to inspection and palpation. Standing hip rotation and gait without trendelenburg sign / unsteadiness. Greater trochanteric bursitis severe bilateral Mild tenderness bilaterally SI joint   Procedure: Real-time Ultrasound Guided Injection of right greater trochanteric bursitis secondary to patient's body habitus Device: GE Logiq Q7 Ultrasound guided injection is preferred based studies that show increased duration, increased  effect, greater accuracy, decreased procedural pain, increased response rate, and decreased cost with ultrasound guided versus blind injection.  Verbal informed consent obtained.  Time-out conducted.  Noted no overlying erythema, induration, or other signs of local infection.  Skin prepped in a sterile fashion.  Local anesthesia: Topical Ethyl chloride.  With sterile technique and under real time ultrasound guidance:  Greater trochanteric area was visualized and patient's bursa was noted. A 22-gauge 3 inch needle was inserted and 4 cc of 0.5% Marcaine and 1 cc of Kenalog 40 mg/dL was injected. Pictures taken Completed without difficulty  Pain immediately resolved suggesting accurate placement of the medication.  Advised to call if fevers/chills, erythema, induration, drainage, or persistent bleeding.  Images permanently stored and  available for review in the ultrasound unit.  Impression: Technically successful ultrasound guided injection.   Procedure: Real-time Ultrasound Guided Injection of left  greater trochanteric bursitis secondary to patient's body habitus Device: GE Logiq Q7  Ultrasound guided injection is preferred based studies that show increased duration, increased effect, greater accuracy, decreased procedural pain, increased response rate, and decreased cost with ultrasound guided versus blind injection.  Verbal informed consent obtained.  Time-out conducted.  Noted no overlying erythema, induration, or other signs of local infection.  Skin prepped in a sterile fashion.  Local anesthesia: Topical Ethyl chloride.  With sterile technique and under real time ultrasound guidance:  Greater trochanteric area was visualized and patient's bursa was noted. A 22-gauge 3 inch needle was inserted and 4 cc of 0.5% Marcaine and 1 cc of Kenalog 40 mg/dL was injected. Pictures taken Completed without difficulty  Pain immediately resolved suggesting accurate placement of the medication.  Advised  to call if fevers/chills, erythema, induration, drainage, or persistent bleeding.  Images permanently stored and available for review in the ultrasound unit.  Impression: Technically successful ultrasound guided injection.    Impression and Recommendations:     This case required medical decision making of moderate complexity. The above documentation has been reviewed and is accurate and complete Judi Saa, DO       Note: This dictation was prepared with Dragon dictation along with smaller phrase technology. Any transcriptional errors that result from this process are unintentional.

## 2018-08-09 ENCOUNTER — Other Ambulatory Visit (INDEPENDENT_AMBULATORY_CARE_PROVIDER_SITE_OTHER): Payer: PRIVATE HEALTH INSURANCE

## 2018-08-09 ENCOUNTER — Encounter: Payer: Self-pay | Admitting: Family Medicine

## 2018-08-09 ENCOUNTER — Encounter: Payer: Self-pay | Admitting: Internal Medicine

## 2018-08-09 ENCOUNTER — Ambulatory Visit: Payer: Self-pay

## 2018-08-09 ENCOUNTER — Ambulatory Visit: Payer: PRIVATE HEALTH INSURANCE | Admitting: Family Medicine

## 2018-08-09 ENCOUNTER — Other Ambulatory Visit (HOSPITAL_COMMUNITY)
Admission: RE | Admit: 2018-08-09 | Discharge: 2018-08-09 | Disposition: A | Discharge status: Home / Self Care | Payer: PRIVATE HEALTH INSURANCE | Source: Ambulatory Visit | Attending: Internal Medicine | Admitting: Internal Medicine

## 2018-08-09 ENCOUNTER — Ambulatory Visit (INDEPENDENT_AMBULATORY_CARE_PROVIDER_SITE_OTHER): Payer: PRIVATE HEALTH INSURANCE | Admitting: Internal Medicine

## 2018-08-09 VITALS — BP 128/88 | HR 71 | Ht 64.0 in | Wt 202.0 lb

## 2018-08-09 VITALS — BP 128/88 | HR 71 | Temp 98.3°F | Resp 16 | Ht 64.0 in | Wt 202.0 lb

## 2018-08-09 DIAGNOSIS — Z1231 Encounter for screening mammogram for malignant neoplasm of breast: Secondary | ICD-10-CM

## 2018-08-09 DIAGNOSIS — Z789 Other specified health status: Secondary | ICD-10-CM | POA: Diagnosis not present

## 2018-08-09 DIAGNOSIS — M25552 Pain in left hip: Secondary | ICD-10-CM

## 2018-08-09 DIAGNOSIS — Z23 Encounter for immunization: Secondary | ICD-10-CM | POA: Diagnosis not present

## 2018-08-09 DIAGNOSIS — M7061 Trochanteric bursitis, right hip: Secondary | ICD-10-CM | POA: Insufficient documentation

## 2018-08-09 DIAGNOSIS — M79605 Pain in left leg: Secondary | ICD-10-CM

## 2018-08-09 DIAGNOSIS — R634 Abnormal weight loss: Secondary | ICD-10-CM

## 2018-08-09 DIAGNOSIS — Z124 Encounter for screening for malignant neoplasm of cervix: Secondary | ICD-10-CM | POA: Diagnosis not present

## 2018-08-09 DIAGNOSIS — M7062 Trochanteric bursitis, left hip: Secondary | ICD-10-CM | POA: Diagnosis not present

## 2018-08-09 DIAGNOSIS — Z0000 Encounter for general adult medical examination without abnormal findings: Secondary | ICD-10-CM

## 2018-08-09 DIAGNOSIS — D751 Secondary polycythemia: Secondary | ICD-10-CM

## 2018-08-09 DIAGNOSIS — F418 Other specified anxiety disorders: Secondary | ICD-10-CM

## 2018-08-09 DIAGNOSIS — Z720 Tobacco use: Secondary | ICD-10-CM

## 2018-08-09 LAB — COMPREHENSIVE METABOLIC PANEL
ALBUMIN: 4.5 g/dL (ref 3.5–5.2)
ALT: 21 U/L (ref 0–35)
AST: 27 U/L (ref 0–37)
Alkaline Phosphatase: 63 U/L (ref 39–117)
BUN: 11 mg/dL (ref 6–23)
CALCIUM: 9.8 mg/dL (ref 8.4–10.5)
CHLORIDE: 104 meq/L (ref 96–112)
CO2: 30 meq/L (ref 19–32)
CREATININE: 0.6 mg/dL (ref 0.40–1.20)
GFR: 116.16 mL/min (ref >60.00)
Glucose, Bld: 89 mg/dL (ref 70–99)
POTASSIUM: 4.2 meq/L (ref 3.5–5.1)
SODIUM: 141 meq/L (ref 135–145)
Total Bilirubin: 0.7 mg/dL (ref 0.2–1.2)
Total Protein: 7.5 g/dL (ref 6.0–8.3)

## 2018-08-09 LAB — HM PAP SMEAR

## 2018-08-09 LAB — CBC WITH DIFFERENTIAL/PLATELET
Basophils Absolute: 0.1 10*3/uL (ref 0.0–0.1)
Basophils Relative: 0.9 % (ref 0.0–3.0)
EOS ABS: 0.1 10*3/uL (ref 0.0–0.7)
EOS PCT: 2.2 % (ref 0.0–5.0)
HEMATOCRIT: 47.1 % — AB (ref 36.0–46.0)
HEMOGLOBIN: 15.7 g/dL — AB (ref 12.0–15.0)
LYMPHS PCT: 29 % (ref 12.0–46.0)
Lymphs Abs: 1.8 10*3/uL (ref 0.7–4.0)
MCHC: 33.3 g/dL (ref 30.0–36.0)
MCV: 88.9 fl (ref 78.0–100.0)
Monocytes Absolute: 0.4 10*3/uL (ref 0.1–1.0)
Monocytes Relative: 7.3 % (ref 3.0–12.0)
NEUTROS ABS: 3.7 10*3/uL (ref 1.4–7.7)
Neutrophils Relative %: 60.6 % (ref 43.0–77.0)
PLATELETS: 247 10*3/uL (ref 150.0–400.0)
RBC: 5.29 Mil/uL — ABNORMAL HIGH (ref 3.87–5.11)
RDW: 13.8 % (ref 11.5–15.5)
WBC: 6.1 10*3/uL (ref 4.0–10.5)

## 2018-08-09 LAB — LIPID PANEL
Cholesterol: 219 mg/dL — ABNORMAL HIGH (ref 0–200)
HDL: 42.6 mg/dL (ref >39.00)
LDL Cholesterol: 154 mg/dL — ABNORMAL HIGH (ref 0–99)
NonHDL: 176
TRIGLYCERIDES: 110 mg/dL (ref 0.0–149.0)
Total CHOL/HDL Ratio: 5
VLDL: 22 mg/dL (ref 0.0–40.0)

## 2018-08-09 LAB — TSH: TSH: 1.55 u[IU]/mL (ref 0.35–4.50)

## 2018-08-09 MED ORDER — MELOXICAM 15 MG PO TABS: 15.0000 mg | ORAL_TABLET | Freq: Every day | ORAL | 3 refills | Status: DC

## 2018-08-09 MED ORDER — ESCITALOPRAM OXALATE 20 MG PO TABS: 20.0000 mg | ORAL_TABLET | Freq: Every day | ORAL | 1 refills | Status: DC

## 2018-08-09 MED ORDER — VITAMIN D (ERGOCALCIFEROL) 1.25 MG (50000 UNIT) PO CAPS: 50000.0000 [IU] | ORAL_CAPSULE | ORAL | 0 refills | Status: DC

## 2018-08-09 NOTE — Patient Instructions (Signed)
Good to see you  Ice is your friend Meloixcam and vitamin D refilled Stay active Injected both sides of the hip  See me again in 3 months!

## 2018-08-09 NOTE — Patient Instructions (Signed)
Preventive Care 18-39 Years, Female Preventive care refers to lifestyle choices and visits with your health care provider that can promote health and wellness. What does preventive care include?  A yearly physical exam. This is also called an annual well check.  Dental exams once or twice a year.  Routine eye exams. Ask your health care provider how often you should have your eyes checked.  Personal lifestyle choices, including: ? Daily care of your teeth and gums. ? Regular physical activity. ? Eating a healthy diet. ? Avoiding tobacco and drug use. ? Limiting alcohol use. ? Practicing safe sex. ? Taking vitamin and mineral supplements as recommended by your health care provider. What happens during an annual well check? The services and screenings done by your health care provider during your annual well check will depend on your age, overall health, lifestyle risk factors, and family history of disease. Counseling Your health care provider may ask you questions about your:  Alcohol use.  Tobacco use.  Drug use.  Emotional well-being.  Home and relationship well-being.  Sexual activity.  Eating habits.  Work and work Statistician.  Method of birth control.  Menstrual cycle.  Pregnancy history.  Screening You may have the following tests or measurements:  Height, weight, and BMI.  Diabetes screening. This is done by checking your blood sugar (glucose) after you have not eaten for a while (fasting).  Blood pressure.  Lipid and cholesterol levels. These may be checked every 5 years starting at age 66.  Skin check.  Hepatitis C blood test.  Hepatitis B blood test.  Sexually transmitted disease (STD) testing.  BRCA-related cancer screening. This may be done if you have a family history of breast, ovarian, tubal, or peritoneal cancers.  Pelvic exam and Pap test. This may be done every 3 years starting at age 40. Starting at age 59, this may be done every 5  years if you have a Pap test in combination with an HPV test.  Discuss your test results, treatment options, and if necessary, the need for more tests with your health care provider. Vaccines Your health care provider may recommend certain vaccines, such as:  Influenza vaccine. This is recommended every year.  Tetanus, diphtheria, and acellular pertussis (Tdap, Td) vaccine. You may need a Td booster every 10 years.  Varicella vaccine. You may need this if you have not been vaccinated.  HPV vaccine. If you are 69 or younger, you may need three doses over 6 months.  Measles, mumps, and rubella (MMR) vaccine. You may need at least one dose of MMR. You may also need a second dose.  Pneumococcal 13-valent conjugate (PCV13) vaccine. You may need this if you have certain conditions and were not previously vaccinated.  Pneumococcal polysaccharide (PPSV23) vaccine. You may need one or two doses if you smoke cigarettes or if you have certain conditions.  Meningococcal vaccine. One dose is recommended if you are age 27-21 years and a first-year college student living in a residence hall, or if you have one of several medical conditions. You may also need additional booster doses.  Hepatitis A vaccine. You may need this if you have certain conditions or if you travel or work in places where you may be exposed to hepatitis A.  Hepatitis B vaccine. You may need this if you have certain conditions or if you travel or work in places where you may be exposed to hepatitis B.  Haemophilus influenzae type b (Hib) vaccine. You may need this if  you have certain risk factors.  Talk to your health care provider about which screenings and vaccines you need and how often you need them. This information is not intended to replace advice given to you by your health care provider. Make sure you discuss any questions you have with your health care provider. Document Released: 12/13/2001 Document Revised: 07/06/2016  Document Reviewed: 08/18/2015 Elsevier Interactive Patient Education  [employer].

## 2018-08-09 NOTE — Progress Notes (Signed)
Subjective:  Patient ID: Danielle Cook, female    DOB: July 24, 1976  Age: 42 y.o. MRN: [id number]  CC: Annual Exam   HPI Danielle Cook presents for a CPX.  She is recently gone through a job change.  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  She complains of increasing anxiety and anhedonia during this time. She wants to try higher dose of Lexapro.  She has intentionally lost weight over the last year with lifestyle modifications.  Outpatient Medications Prior to Visit  Medication Sig Dispense Refill  . gabapentin (NEURONTIN) 100 MG capsule Take 2 capsules (200 mg total) by mouth at bedtime. 60 capsule 3  . levonorgestrel (MIRENA, 52 MG,) 20 MCG/24HR IUD 1 Intra Uterine Device (1 each total) by Intrauterine route once. 1 each 0  . loratadine (CLARITIN) 10 MG tablet Take 10 mg by mouth every morning.      . meloxicam (MOBIC) 15 MG tablet Take 1 tablet (15 mg total) by mouth daily. 90 tablet 3  . Vitamin D, Ergocalciferol, (DRISDOL) 50000 units CAPS capsule Take 1 capsule (50,000 Units total) by mouth every 7 (seven) days. 12 capsule 0  . escitalopram (LEXAPRO) 10 MG tablet TAKE 1 TABLET BY MOUTH EVERY DAY. 30 tablet 0  . escitalopram (LEXAPRO) 5 MG tablet TAKE 1 TABLET BY MOUTH EVERY DAY 90 tablet 1  . meloxicam (MOBIC) 15 MG tablet Take 1 tablet (15 mg total) by mouth daily. 30 tablet 3  . Vitamin D, Ergocalciferol, (DRISDOL) 50000 units CAPS capsule TAKE ONE CAPSULE BY MOUTH EVERY 7 DAYS 12 capsule 0   No facility-administered medications prior to visit.     ROS Review of Systems  Constitutional: Positive for unexpected weight change. Negative for diaphoresis and fatigue.  HENT: Negative for trouble swallowing.   Respiratory: Negative.  Negative for cough, chest tightness, shortness of breath and wheezing.   Cardiovascular: Negative for chest pain, palpitations and leg swelling.  Gastrointestinal: Negative for abdominal pain,  constipation, diarrhea, nausea and vomiting.  Endocrine: Negative.   Genitourinary: Negative.  Negative for decreased urine volume, difficulty urinating, dyspareunia, pelvic pain, vaginal bleeding and vaginal discharge.  Musculoskeletal: Negative.   Skin: Negative.   Neurological: Negative.  Negative for dizziness and headaches.  Hematological: Negative for adenopathy. Does not bruise/bleed easily.  Psychiatric/Behavioral: Positive for dysphoric mood. Negative for agitation, behavioral problems, confusion, decreased concentration, hallucinations, self-injury, sleep disturbance and suicidal ideas. The patient is nervous/anxious. The patient is not hyperactive.     Objective:  BP 128/88 (BP Location: Left Arm, Patient Position: Sitting, Cuff Size: Large)   Pulse 71   Temp 98.3 F (36.8 C) (Oral)   Resp 16   Ht 5\' 4"  (1.626 m)   Wt 202 lb (91.6 kg)   LMP 08/07/2018   SpO2 96%   BMI 34.67 kg/m   BP Readings from Last 3 Encounters:  08/09/18 128/88  08/09/18 128/88  04/17/17 114/80    Wt Readings from Last 3 Encounters:  08/09/18 202 lb (91.6 kg)  08/09/18 202 lb (91.6 kg)  04/17/17 226 lb (102.5 kg)    Physical Exam  Constitutional: She is oriented to person, place, and time. No distress.  HENT:  Mouth/Throat: Oropharynx is clear and moist. No oropharyngeal exudate.  Eyes: Conjunctivae are normal. No scleral icterus.  Neck: Normal range of motion. Neck supple. No JVD present. No thyromegaly present.  Cardiovascular: Normal rate, regular rhythm and normal heart sounds.  Exam reveals no gallop and no friction rub.  No murmur heard. Pulmonary/Chest: Effort normal and breath sounds normal. No respiratory distress. She has no wheezes. She has no rhonchi. She has no rales. She exhibits no mass. Right breast exhibits no inverted nipple, no mass, no nipple discharge, no skin change and no tenderness. Left breast exhibits no mass, no nipple discharge, no skin change and no tenderness. No  breast swelling, tenderness or discharge. Breasts are symmetrical.  Abdominal: Soft. Bowel sounds are normal. She exhibits no mass. There is no hepatosplenomegaly. There is no tenderness. Hernia confirmed negative in the right inguinal area and confirmed negative in the left inguinal area.  Genitourinary: Rectum normal, vagina normal and uterus normal. Pelvic exam was performed with patient supine. There is no rash, tenderness, lesion or injury on the right labia. There is no rash, tenderness, lesion or injury on the left labia. Uterus is not deviated, not enlarged, not fixed and not tender. Cervix exhibits no motion tenderness, no discharge and no friability. Right adnexum displays no mass, no tenderness and no fullness. Left adnexum displays no mass, no tenderness and no fullness. No erythema, tenderness or bleeding in the vagina. No foreign body in the vagina. No signs of injury around the vagina. No vaginal discharge found.  Musculoskeletal: Normal range of motion. She exhibits no edema, tenderness or deformity.  Lymphadenopathy:    She has no cervical adenopathy. No inguinal adenopathy noted on the right or left side.  Neurological: She is alert and oriented to person, place, and time.  Skin: Skin is warm and dry. She is not diaphoretic. No pallor.  Psychiatric: Her speech is normal and behavior is normal. Judgment normal. Her mood appears anxious. Her affect is not inappropriate. She is not agitated and not slowed. Thought content is not paranoid. Cognition and memory are normal. She does not exhibit a depressed mood. She expresses no homicidal and no suicidal ideation.  Vitals reviewed.   Lab Results  Component Value Date   WBC 6.1 08/09/2018   HGB 15.7 (H) 08/09/2018   HCT 47.1 (H) 08/09/2018   PLT 247.0 08/09/2018   GLUCOSE 89 08/09/2018   CHOL 219 (H) 08/09/2018   TRIG 110.0 08/09/2018   HDL 42.60 08/09/2018   LDLCALC 154 (H) 08/09/2018   ALT 21 08/09/2018   AST 27 08/09/2018   NA  141 08/09/2018   K 4.2 08/09/2018   CL 104 08/09/2018   CREATININE 0.60 08/09/2018   BUN 11 08/09/2018   CO2 30 08/09/2018   TSH 1.55 08/09/2018    Mm Digital Screening Bilateral  Result Date: 02/07/2017 CLINICAL DATA:  Screening. EXAM: DIGITAL SCREENING BILATERAL MAMMOGRAM WITH CAD COMPARISON:  None. ACR Breast Density Category b: There are scattered areas of fibroglandular density. FINDINGS: There are no findings suspicious for malignancy. Images were processed with CAD. IMPRESSION: No mammographic evidence of malignancy. A result letter of this screening mammogram will be mailed directly to the patient. RECOMMENDATION: Screening mammogram in one year. (Code:SM-B-01Y) BI-RADS CATEGORY  1: Negative. Electronically Signed   By: Norva Pavlov M.D.   On: 02/07/2017 16:58    Assessment & Plan:   Yaira was seen today for annual exam.  Diagnoses and all orders for this visit:  Routine general medical examination at a health care facility-exam completed, labs reviewed, vaccines reviewed and updated, she is referred for screening mammogram, Pap completed today. -     Lipid panel; Future  Visit for screening mammogram -  MM DIGITAL SCREENING BILATERAL; Future  Cervical cancer screening -     Cytology - PAP; Future  Depression with anxiety-she will try higher dose of Lexapro. -     escitalopram (LEXAPRO) 20 MG tablet; Take 1 tablet (20 mg total) by mouth daily.  Weight loss, abnormal- Review of systems is negative for anything concerning.  Her labs are all normal with the exception of erythrocytosis which is likely explained by her history of vaping.  The weight loss is expected based on her lifestyle modifications. -     CBC with Differential/Platelet; Future -     Comprehensive metabolic panel; Future -     TSH; Future  Need for Tdap vaccination -     Tdap vaccine greater than or equal to 7yo IM  Nicotine vapor product user- She was asked to quit vaping.  Erythrocytosis- Her  H&H are mildly elevated.  This is a new finding for her.  This is likely explained by her history of vaping.  I have asked to return in 1 to 2 months for me to recheck her H&H.   I have discontinued Anette Riedel. Olejniczak's escitalopram and escitalopram. I am also having her start on escitalopram. Additionally, I am having her maintain her loratadine, levonorgestrel, gabapentin, meloxicam, and Vitamin D (Ergocalciferol).  Meds ordered this encounter  Medications  . escitalopram (LEXAPRO) 20 MG tablet    Sig: Take 1 tablet (20 mg total) by mouth daily.    Dispense:  90 tablet    Refill:  1     Follow-up: Return in about 1 year (around 08/10/2019).  Sanda Linger, MD

## 2018-08-09 NOTE — Assessment & Plan Note (Signed)
Bilateral injections given today.  Refill medications that seem to be beneficial.  Discussed icing regimen and home exercises.  Given range of motion exercises for the back.  Pain continues will need to consider the possibility of lumbar radiculopathy and will need further work-up.  I believe though the patient will do well overall.  Follow-up again in 4 to 8 weeks

## 2018-08-11 ENCOUNTER — Encounter: Payer: Self-pay | Admitting: Internal Medicine

## 2018-08-13 ENCOUNTER — Encounter: Payer: Self-pay | Admitting: Internal Medicine

## 2018-08-13 LAB — CYTOLOGY - PAP
CHLAMYDIA, DNA PROBE: NEGATIVE
DIAGNOSIS: NEGATIVE
HPV: NOT DETECTED
NEISSERIA GONORRHEA: NEGATIVE
Trichomonas: NEGATIVE

## 2019-01-30 ENCOUNTER — Other Ambulatory Visit: Payer: Self-pay | Admitting: Internal Medicine

## 2019-01-30 DIAGNOSIS — F418 Other specified anxiety disorders: Secondary | ICD-10-CM

## 2019-02-20 ENCOUNTER — Other Ambulatory Visit: Payer: Self-pay | Admitting: Family Medicine

## 2019-02-20 NOTE — Telephone Encounter (Signed)
Left message for patient to call back to schedule virtual visit.  

## 2019-02-21 NOTE — Telephone Encounter (Signed)
Left message for patient to call back to schedule virtual visit.  

## 2019-03-20 ENCOUNTER — Other Ambulatory Visit: Payer: Self-pay | Admitting: Family Medicine

## 2019-03-20 NOTE — Telephone Encounter (Signed)
Left message for patient to call back to schedule virtual visit.  

## 2019-04-01 NOTE — Progress Notes (Signed)
Tawana ScaleZach Cook D.O. [facility] [address] [place], [place] [address] Phone: [phone] Subjective:   Danielle Cook, Danielle Cook, am serving as a scribe for Dr. Antoine PrimasZachary Cook.  I'm seeing this patient by the request  of:    CC: Bilateral hip pain  WNU:[id number]:Subjective   08/09/2018: Bilateral injections given today.  Refill medications that seem to be beneficial.  Discussed icing regimen and home exercises.  Given range of motion exercises for the back.  Pain continues will need to consider the possibility of lumbar radiculopathy and will need further work-up.  I believe though the patient will do well overall.  Follow-up again in 4 to 8 weeks  Update 04/02/2019: Danielle Cook is a 43 y.o. female coming in with complaint of bilateral hip pain. Patient is sitting more at work and feels that her pain increased. Meloxicam and vit d patient states that the pain is worsening again.  Seems to be low bilateral on the lateral aspect.  Minimal radiation of pain.  Mild back pain.  Patient states when have the injection was doing significantly better.     Past Medical History:  Diagnosis Date  . Abnormal Pap smear   . Allergy   . History of chicken pox   . 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 Hyperlipidemia   . Migraine   . PPD positive, treated 2004   Past Surgical History:  Procedure Laterality Date  . CHOLECYSTECTOMY, LAPAROSCOPIC  07/13/2000  . COLPOSCOPY  2000,   . TONSILLECTOMY    . WISDOM TOOTH EXTRACTION  1995 Jan   Social History   Socioeconomic History  . Marital status: Married    Spouse name: Not on file  . Number of children: Not on file  . Years of education: Not on file  . Highest education level: Not on file  Occupational History  . Not on file  Social Needs  . Financial resource strain: Not on file  . Food insecurity:    Worry: Not on file    Inability: Not on file  . Transportation needs:    Medical: Not on file    Non-medical: Not on file  Tobacco Use  . Smoking  status: Current Every Day Smoker    Packs/day: 0.25    Years: 15.00    Pack years: 3.75    Types: E-cigarettes  . Smokeless tobacco: Never Used  Substance and Sexual Activity  . Alcohol use: No    Comment: few glasses of wine during pregnancy  . Drug use: No  . Sexual activity: Yes    Partners: Male  Lifestyle  . Physical activity:    Days per week: Not on file    Minutes per session: Not on file  . Stress: Not on file  Relationships  . Social connections:    Talks on phone: Not on file    Gets together: Not on file    Attends religious service: Not on file    Active member of club or organization: Not on file    Attends meetings of clubs or organizations: Not on file    Relationship status: Not on file  Other Topics Concern  . Not on file  Social History Narrative  . Not on file   Allergies  Allergen Reactions  . Eggs Or Egg-Derived Products   . Guaifenesin Er Anxiety and Palpitations  . Latex Hives and Rash  . Wellbutrin [Bupropion Hcl] Anxiety and Palpitations   Family History  Problem Relation Age  of Onset  . Stroke Mother   . Dementia Mother   . Hyperlipidemia Mother   . Early death Father   . Heart disease Father   . Hyperlipidemia Father   . Cancer Neg Hx   . Depression Neg Hx   . Diabetes Neg Hx   . Drug abuse Neg Hx   . Hearing loss Neg Hx   . Hypertension Neg Hx     Current Outpatient Medications (Endocrine & Metabolic):  .  levonorgestrel (MIRENA, 52 MG,) 20 MCG/24HR IUD, 1 Intra Uterine Device (1 each total) by Intrauterine route once.   Current Outpatient Medications (Respiratory):  .  loratadine (CLARITIN) 10 MG tablet, Take 10 mg by mouth every morning.    Current Outpatient Medications (Analgesics):  .  meloxicam (MOBIC) 15 MG tablet, Take 1 tablet (15 mg total) by mouth daily.   Current Outpatient Medications (Other):  .  escitalopram (LEXAPRO) 20 MG tablet, TAKE 1 TABLET BY MOUTH EVERY DAY .  gabapentin (NEURONTIN) 100 MG capsule,  Take 2 capsules (200 mg total) by mouth at bedtime. .  Vitamin D, Ergocalciferol, (DRISDOL) 1.25 MG (50000 UT) CAPS capsule, Take 1 capsule (50,000 Units total) by mouth every 7 (seven) days.    Past medical history, social, surgical and family history all reviewed in electronic medical record.  No pertanent information unless stated regarding to the chief complaint.   Review of Systems:  No headache, visual changes, nausea, vomiting, diarrhea, constipation, dizziness, abdominal pain, skin rash, fevers, chills, night sweats, weight loss, swollen lymph nodes, body aches, joint swelling,  chest pain, shortness of breath, mood changes.  Positive muscle aches  Objective  Blood pressure 120/70, pulse 73, height 5\' 4"  (1.626 m), SpO2 97 %.    General: No apparent distress alert and oriented x3 mood and affect normal, dressed appropriately.  HEENT: Pupils equal, extraocular movements intact  Respiratory: Patient's speak in full sentences and does not appear short of breath  Cardiovascular: No lower extremity edema, non tender, no erythema  Skin: Warm dry intact with no signs of infection or rash on extremities or on axial skeleton.  Abdomen: Soft nontender  Neuro: Cranial nerves II through XII are intact, neurovascularly intact in all extremities with 2+ DTRs and 2+ pulses.  Lymph: No lymphadenopathy of posterior or anterior cervical chain or axillae bilaterally.  Gait normal with good balance and coordination.  MSK:  Non tender with full range of motion and good stability and symmetric strength and tone of shoulders, elbows, wrist, , knee and ankles bilaterally.   Bilateral hip exam shows the patient has near full range of motion.  Positive Faber bilaterally with pain over the lateral aspect of the hips bilaterally right greater than left.  Severe tenderness to palpation over the greater trochanteric area.  Negative straight leg test.  Neurovascularly intact distally.  5 out of 5 strength of the  lower extremities.    Procedure: Real-time Ultrasound Guided Injection of right greater trochanteric bursitis secondary to patient's body habitus Device: GE Logiq Q7 Ultrasound guided injection is preferred based studies that show increased duration, increased effect, greater accuracy, decreased procedural pain, increased response rate, and decreased cost with ultrasound guided versus blind injection.  Verbal informed consent obtained.  Time-out conducted.  Noted no overlying erythema, induration, or other signs of local infection.  Skin prepped in a sterile fashion.  Local anesthesia: Topical Ethyl chloride.  With sterile technique and under real time ultrasound guidance:  Greater trochanteric area was visualized and  patient's bursa was noted. A 22-gauge 3 inch needle was inserted and 4 cc of 0.5% Marcaine and 1 cc of Kenalog 40 mg/dL was injected. Pictures taken Completed without difficulty  Pain immediately resolved suggesting accurate placement of the medication.  Advised to call if fevers/chills, erythema, induration, drainage, or persistent bleeding.  Images permanently stored and available for review in the ultrasound unit.  Impression: Technically successful ultrasound guided injection.   Procedure: Real-time Ultrasound Guided Injection of left  greater trochanteric bursitis secondary to patient's body habitus Device: GE Logiq Q7  Ultrasound guided injection is preferred based studies that show increased duration, increased effect, greater accuracy, decreased procedural pain, increased response rate, and decreased cost with ultrasound guided versus blind injection.  Verbal informed consent obtained.  Time-out conducted.  Noted no overlying erythema, induration, or other signs of local infection.  Skin prepped in a sterile fashion.  Local anesthesia: Topical Ethyl chloride.  With sterile technique and under real time ultrasound guidance:  Greater trochanteric area was visualized  and patient's bursa was noted. A 22-gauge 3 inch needle was inserted and 4 cc of 0.5% Marcaine and 1 cc of Kenalog 40 mg/dL was injected. Pictures taken Completed without difficulty  Pain immediately resolved suggesting accurate placement of the medication.  Advised to call if fevers/chills, erythema, induration, drainage, or persistent bleeding.  Images permanently stored and available for review in the ultrasound unit.  Impression: Technically successful ultrasound guided injection.    Impression and Recommendations:     This case required medical decision making of moderate complexity. The above documentation has been reviewed and is accurate and complete Judi Saa, DO       Note: This dictation was prepared with Dragon dictation along with smaller phrase technology. Any transcriptional errors that result from this process are unintentional.

## 2019-04-02 ENCOUNTER — Other Ambulatory Visit: Payer: Self-pay

## 2019-04-02 ENCOUNTER — Encounter: Payer: Self-pay | Admitting: Family Medicine

## 2019-04-02 ENCOUNTER — Ambulatory Visit: Payer: Self-pay

## 2019-04-02 ENCOUNTER — Ambulatory Visit (INDEPENDENT_AMBULATORY_CARE_PROVIDER_SITE_OTHER): Payer: PRIVATE HEALTH INSURANCE | Admitting: Family Medicine

## 2019-04-02 VITALS — BP 120/70 | HR 73 | Ht 64.0 in

## 2019-04-02 DIAGNOSIS — M25551 Pain in right hip: Secondary | ICD-10-CM

## 2019-04-02 DIAGNOSIS — M25552 Pain in left hip: Secondary | ICD-10-CM | POA: Diagnosis not present

## 2019-04-02 DIAGNOSIS — M7062 Trochanteric bursitis, left hip: Secondary | ICD-10-CM | POA: Diagnosis not present

## 2019-04-02 DIAGNOSIS — M7061 Trochanteric bursitis, right hip: Secondary | ICD-10-CM

## 2019-04-02 MED ORDER — MELOXICAM 15 MG PO TABS: 15.0000 mg | ORAL_TABLET | Freq: Every day | ORAL | 3 refills | Status: DC

## 2019-04-02 MED ORDER — VITAMIN D (ERGOCALCIFEROL) 1.25 MG (50000 UNIT) PO CAPS: 50000.0000 [IU] | ORAL_CAPSULE | ORAL | 0 refills | Status: DC

## 2019-04-02 NOTE — Assessment & Plan Note (Signed)
Repeat injection given again today.  Refilled gabapentin, vitamin D, as well as meloxicam.  Patient has also breakthrough pain.  Patient has been doing relatively well and hopefully will only need injection every once a year.  We discussed the possibility of working on patient's back which patient has declined.  Patient will continue with conservative therapy otherwise.  Follow-up again in 3 months

## 2019-04-02 NOTE — Patient Instructions (Signed)
Good to see you  Increase activity slowly  Refilled med See me again nin 3 months

## 2019-04-22 ENCOUNTER — Other Ambulatory Visit: Payer: Self-pay | Admitting: Family Medicine

## 2019-07-04 ENCOUNTER — Ambulatory Visit (INDEPENDENT_AMBULATORY_CARE_PROVIDER_SITE_OTHER)
Admission: RE | Admit: 2019-07-04 | Discharge: 2019-07-04 | Disposition: A | Discharge status: Home / Self Care | Payer: PRIVATE HEALTH INSURANCE | Source: Ambulatory Visit | Attending: Family Medicine | Admitting: Family Medicine

## 2019-07-04 ENCOUNTER — Encounter: Payer: Self-pay | Admitting: Family Medicine

## 2019-07-04 ENCOUNTER — Other Ambulatory Visit: Payer: Self-pay

## 2019-07-04 ENCOUNTER — Ambulatory Visit (INDEPENDENT_AMBULATORY_CARE_PROVIDER_SITE_OTHER): Payer: PRIVATE HEALTH INSURANCE | Admitting: Family Medicine

## 2019-07-04 VITALS — BP 100/70 | HR 73 | Ht 64.0 in | Wt 214.0 lb

## 2019-07-04 DIAGNOSIS — M7062 Trochanteric bursitis, left hip: Secondary | ICD-10-CM

## 2019-07-04 DIAGNOSIS — M76891 Other specified enthesopathies of right lower limb, excluding foot: Secondary | ICD-10-CM | POA: Diagnosis not present

## 2019-07-04 DIAGNOSIS — G8929 Other chronic pain: Secondary | ICD-10-CM | POA: Diagnosis not present

## 2019-07-04 DIAGNOSIS — M545 Low back pain, unspecified: Secondary | ICD-10-CM

## 2019-07-04 DIAGNOSIS — M542 Cervicalgia: Secondary | ICD-10-CM | POA: Diagnosis not present

## 2019-07-04 DIAGNOSIS — M7061 Trochanteric bursitis, right hip: Secondary | ICD-10-CM | POA: Diagnosis not present

## 2019-07-04 NOTE — Patient Instructions (Signed)
Good to see you Exercise 3 times a week Body helix Knee compression See [place] again in 6 weeks

## 2019-07-04 NOTE — Progress Notes (Signed)
Tawana ScaleZach Dorsey Authement D.O. [facility] [address] [place], [place] [address] Phone: [phone] Subjective:   I Ronelle NighKana Thompson am serving as a Neurosurgeonscribe for Dr. Antoine PrimasZachary Kaydan Wong.  I'm seeing this patient by the request  of:    CC: Hip pain follow-up  BJY:[id number]:Subjective   04/02/2019 Repeat injection given again today.  Refilled gabapentin, vitamin D, as well as meloxicam.  Patient has also breakthrough pain.  Patient has been doing relatively well and hopefully will only need injection every once a year.  We discussed the possibility of working on patient's back which patient has declined.  Patient will continue with conservative therapy otherwise.  Follow-up again in 3 months  07/04/2019 Garrel RidgelBonnie J Monnig is a 43 y.o. female coming in with complaint of bilateral hip and right knee pain. Believed she hyper extended it.    Onset- chronic  Location - posterior  Character- achy, sore Aggravating factors- walking, standing  Therapies tried- brace, ice, Ibuprofen, meloxicam  Severity-5 out of 10     Past Medical History:  Diagnosis Date  . Abnormal Pap smear   . Allergy   . History of chicken pox   . 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 Hyperlipidemia   . Migraine   . PPD positive, treated 2004   Past Surgical History:  Procedure Laterality Date  . CHOLECYSTECTOMY, LAPAROSCOPIC  07/13/2000  . COLPOSCOPY  2000,   . TONSILLECTOMY    . WISDOM TOOTH EXTRACTION  1995 Jan   Social History   Socioeconomic History  . Marital status: Married    Spouse name: Not on file  . Number of children: Not on file  . Years of education: Not on file  . Highest education level: Not on file  Occupational History  . Not on file  Social Needs  . Financial resource strain: Not on file  . Food insecurity    Worry: Not on file    Inability: Not on file  . Transportation needs    Medical: Not on file    Non-medical: Not on file  Tobacco Use  . Smoking status: Current Every Day Smoker    Packs/day: 0.25   Years: 15.00    Pack years: 3.75    Types: E-cigarettes  . Smokeless tobacco: Never Used  Substance and Sexual Activity  . Alcohol use: No    Comment: few glasses of wine during pregnancy  . Drug use: No  . Sexual activity: Yes    Partners: Male  Lifestyle  . Physical activity    Days per week: Not on file    Minutes per session: Not on file  . Stress: Not on file  Relationships  . Social Musicianconnections    Talks on phone: Not on file    Gets together: Not on file    Attends religious service: Not on file    Active member of club or organization: Not on file    Attends meetings of clubs or organizations: Not on file    Relationship status: Not on file  Other Topics Concern  . Not on file  Social History Narrative  . Not on file   Allergies  Allergen Reactions  . Eggs Or Egg-Derived Products   . Guaifenesin Er Anxiety and Palpitations  . Latex Hives and Rash  . Wellbutrin [Bupropion Hcl] Anxiety and Palpitations   Family History  Problem Relation Age of Onset  . Stroke Mother   . Dementia Mother   . Hyperlipidemia Mother   .  Early death Father   . Heart disease Father   . Hyperlipidemia Father   . Cancer Neg Hx   . Depression Neg Hx   . Diabetes Neg Hx   . Drug abuse Neg Hx   . Hearing loss Neg Hx   . Hypertension Neg Hx     Current Outpatient Medications (Endocrine & Metabolic):  .  levonorgestrel (MIRENA, 52 MG,) 20 MCG/24HR IUD, 1 Intra Uterine Device (1 each total) by Intrauterine route once.   Current Outpatient Medications (Respiratory):  .  loratadine (CLARITIN) 10 MG tablet, Take 10 mg by mouth every morning.    Current Outpatient Medications (Analgesics):  .  meloxicam (MOBIC) 15 MG tablet, Take 1 tablet (15 mg total) by mouth daily.   Current Outpatient Medications (Other):  .  escitalopram (LEXAPRO) 20 MG tablet, TAKE 1 TABLET BY MOUTH EVERY DAY .  gabapentin (NEURONTIN) 100 MG capsule, Take 2 capsules (200 mg total) by mouth at bedtime. .   Vitamin D, Ergocalciferol, (DRISDOL) 1.25 MG (50000 UT) CAPS capsule, TAKE 1 CAPSULE BY MOUTH EVERY 7 DAYS    Past medical history, social, surgical and family history all reviewed in electronic medical record.  No pertanent information unless stated regarding to the chief complaint.   Review of Systems:  No headache, visual changes, nausea, vomiting, diarrhea, constipation, dizziness, abdominal pain, skin rash, fevers, chills, night sweats, weight loss, swollen lymph nodes, body aches, joint swelling, muscle aches, chest pain, shortness of breath, mood changes.   Objective  There were no vitals taken for this visit. Systems examined below as of    General: No apparent distress alert and oriented x3 mood and affect normal, dressed appropriately.  HEENT: Pupils equal, extraocular movements intact  Respiratory: Patient's speak in full sentences and does not appear short of breath  Cardiovascular: No lower extremity edema, non tender, no erythema  Skin: Warm dry intact with no signs of infection or rash on extremities or on axial skeleton.  Abdomen: Soft nontender  Neuro: Cranial nerves II through XII are intact, neurovascularly intact in all extremities with 2+ DTRs and 2+ pulses.  Lymph: No lymphadenopathy of posterior or anterior cervical chain or axillae bilaterally.  Gait normal with good balance and coordination.  MSK:  Non tender with full range of motion and good stability and symmetric strength and tone of shoulders, elbows, wrist, hip and ankles bilaterally.  Hip: Bilateral ROM IR: 45 Deg, ER: 25 Deg, Flexion: 120 Deg, Extension: 100 Deg, Abduction: 45 Deg, Adduction: 45 Deg Strength IR: 5/5, ER: 5/5, Flexion: 5/5, Extension: 5/5, Abduction: 5/5, Adduction: 5/5 Pelvic alignment unremarkable to inspection and palpation. Standing hip rotation and gait without trendelenburg sign / unsteadiness. Greater trochanter mild tightness No tenderness over piriformis and greater trochanter.  No pain with FABER or FADIR. No SI joint tenderness and normal minimal SI movement.  Right knee exam does have some loss of lacks 5 degrees of extension and some tenderness over the lateral joint line.  More of the popliteal space.  Tightness of the hamstring on the right compared to the left.  Negative McMurray's.   Impression and Recommendations:     This case required medical decision making of moderate complexity. The above documentation has been reviewed and is accurate and complete Lyndal Pulley, DO       Note: This dictation was prepared with Dragon dictation along with smaller phrase technology. Any transcriptional errors that result from this process are unintentional.

## 2019-07-05 ENCOUNTER — Encounter: Payer: Self-pay | Admitting: Family Medicine

## 2019-07-05 DIAGNOSIS — M76899 Other specified enthesopathies of unspecified lower limb, excluding foot: Secondary | ICD-10-CM | POA: Insufficient documentation

## 2019-07-05 NOTE — Assessment & Plan Note (Signed)
Hep, compression, ice and topical nsaid, avoid extension.  RTC in 4 weeks

## 2019-07-05 NOTE — Assessment & Plan Note (Signed)
Stable no change in management 

## 2019-07-16 ENCOUNTER — Other Ambulatory Visit: Payer: Self-pay | Admitting: Family Medicine

## 2019-07-22 ENCOUNTER — Other Ambulatory Visit: Payer: Self-pay | Admitting: Internal Medicine

## 2019-07-22 DIAGNOSIS — F418 Other specified anxiety disorders: Secondary | ICD-10-CM

## 2019-08-05 ENCOUNTER — Encounter: Payer: Self-pay | Admitting: Internal Medicine

## 2019-08-08 ENCOUNTER — Ambulatory Visit: Payer: PRIVATE HEALTH INSURANCE

## 2019-08-12 ENCOUNTER — Ambulatory Visit (INDEPENDENT_AMBULATORY_CARE_PROVIDER_SITE_OTHER): Payer: 59 | Admitting: Internal Medicine

## 2019-08-12 ENCOUNTER — Encounter: Payer: Self-pay | Admitting: Internal Medicine

## 2019-08-12 ENCOUNTER — Other Ambulatory Visit: Payer: Self-pay

## 2019-08-12 VITALS — BP 124/78 | HR 62 | Temp 99.2°F | Resp 16 | Ht 64.0 in | Wt 224.0 lb

## 2019-08-12 DIAGNOSIS — D751 Secondary polycythemia: Secondary | ICD-10-CM

## 2019-08-12 DIAGNOSIS — Z23 Encounter for immunization: Secondary | ICD-10-CM

## 2019-08-12 DIAGNOSIS — F418 Other specified anxiety disorders: Secondary | ICD-10-CM

## 2019-08-12 DIAGNOSIS — Z0000 Encounter for general adult medical examination without abnormal findings: Secondary | ICD-10-CM

## 2019-08-12 DIAGNOSIS — Z1231 Encounter for screening mammogram for malignant neoplasm of breast: Secondary | ICD-10-CM

## 2019-08-12 NOTE — Progress Notes (Signed)
Subjective:  Patient ID: Danielle Cook, female    DOB: [birth year]  Age: 43 y.o. MRN: [id number]  CC: Annual Exam   HPI Danielle Cook presents for a CPX.  She complains of weight gain.  She is in the process of going through a job change.  Outpatient Medications Prior to Visit  Medication Sig Dispense Refill  . escitalopram (LEXAPRO) 20 MG tablet TAKE 1 TABLET BY MOUTH EVERY DAY 30 tablet 5  . gabapentin (NEURONTIN) 100 MG capsule Take 2 capsules (200 mg total) by mouth at bedtime. 60 capsule 3  . levonorgestrel (MIRENA, 52 MG,) 20 MCG/24HR IUD 1 Intra Uterine Device (1 each total) by Intrauterine route once. 1 each 0  . loratadine (CLARITIN) 10 MG tablet Take 10 mg by mouth every morning.      . meloxicam (MOBIC) 15 MG tablet Take 1 tablet (15 mg total) by mouth daily. 90 tablet 3  . Vitamin D, Ergocalciferol, (DRISDOL) 1.25 MG (50000 UT) CAPS capsule TAKE 1 CAPSULE BY MOUTH EVERY 7 DAYS 4 capsule 2   No facility-administered medications prior to visit.     ROS Review of Systems  Constitutional: Positive for unexpected weight change. Negative for diaphoresis and fatigue.  HENT: Negative.   Eyes: Negative.   Respiratory: Negative for cough, chest tightness and shortness of breath.   Cardiovascular: Negative for chest pain, palpitations and leg swelling.  Gastrointestinal: Negative for abdominal pain, constipation, diarrhea, nausea and vomiting.  Endocrine: Negative.   Genitourinary: Negative.  Negative for difficulty urinating, dysuria and hematuria.  Musculoskeletal: Negative for arthralgias and myalgias.  Skin: Negative.  Negative for color change and pallor.  Neurological: Negative.  Negative for dizziness, weakness, light-headedness and headaches.  Hematological: Negative for adenopathy. Does not bruise/bleed easily.  Psychiatric/Behavioral: Negative.     Objective:  BP 124/78 (BP Location: Left Arm, Patient Position: Sitting, Cuff Size: Large)   Pulse 62    Temp 99.2 F (37.3 C) (Oral)   Resp 16   Ht 5\' 4"  (1.626 m)   Wt 224 lb (101.6 kg)   LMP 08/01/2019   SpO2 99%   BMI 38.45 kg/m   BP Readings from Last 3 Encounters:  08/12/19 124/78  07/04/19 100/70  04/02/19 120/70    Wt Readings from Last 3 Encounters:  08/12/19 224 lb (101.6 kg)  07/04/19 214 lb (97.1 kg)  08/09/18 202 lb (91.6 kg)    Physical Exam Vitals signs reviewed.  Constitutional:      Appearance: Normal appearance.  HENT:     Nose: Nose normal.     Mouth/Throat:     Mouth: Mucous membranes are moist.  Eyes:     General: No scleral icterus.    Conjunctiva/sclera: Conjunctivae normal.  Neck:     Musculoskeletal: Normal range of motion.  Cardiovascular:     Rate and Rhythm: Normal rate and regular rhythm.     Heart sounds: No murmur.  Pulmonary:     Effort: Pulmonary effort is normal.     Breath sounds: No stridor. No wheezing, rhonchi or rales.  Abdominal:     General: Abdomen is protuberant. Bowel sounds are normal. There is no distension.     Palpations: Abdomen is soft. There is no hepatomegaly or splenomegaly.     Tenderness: There is no abdominal tenderness.  Musculoskeletal: Normal range of motion.     Right lower leg: No edema.     Left lower leg: No edema.  Lymphadenopathy:     Cervical:  No cervical adenopathy.  Skin:    General: Skin is warm and dry.     Coloration: Skin is not pale.  Neurological:     General: No focal deficit present.     Mental Status: She is alert.  Psychiatric:        Mood and Affect: Mood normal.        Behavior: Behavior normal.     Lab Results  Component Value Date   WBC 8.1 08/13/2019   HGB 13.5 08/13/2019   HCT 40.5 08/13/2019   PLT 214.0 08/13/2019   GLUCOSE 88 08/13/2019   CHOL 227 (H) 08/13/2019   TRIG 188.0 (H) 08/13/2019   HDL 41.70 08/13/2019   LDLCALC 147 (H) 08/13/2019   ALT 16 08/13/2019   AST 16 08/13/2019   NA 139 08/13/2019   K 3.7 08/13/2019   CL 104 08/13/2019   CREATININE 0.53  08/13/2019   BUN 8 08/13/2019   CO2 28 08/13/2019   TSH 1.51 08/13/2019    Dg Cervical Spine Complete  Result Date: 07/04/2019 CLINICAL DATA:  Neck pain EXAM: CERVICAL SPINE - COMPLETE 4+ VIEW COMPARISON:  None. FINDINGS: Reversal of cervical lordosis. Vertebral body heights are normal. Mild degenerative changes at C6-C7. Dens and lateral masses are within normal limits. Possible foraminal narrowing bilaterally C3-C4 right greater than left. IMPRESSION: Mild degenerative changes.  No acute osseous abnormality. Electronically Signed   By: Jasmine Pang M.D.   On: 07/04/2019 14:51   Dg Lumbar Spine Complete  Result Date: 07/04/2019 CLINICAL DATA:  Back pain EXAM: LUMBAR SPINE - COMPLETE 4+ VIEW COMPARISON:  None. FINDINGS: Lumbar alignment within normal limits. Mild disc space narrowing at L5-S1. Minimal degenerative change at T12-L1 and L1-L2. Vertebral body heights are maintained. Surgical clips in the right upper quadrant. IUD in the pelvis. Clips in the right pelvis. IMPRESSION: Mild degenerative changes.  No acute osseous abnormality Electronically Signed   By: Jasmine Pang M.D.   On: 07/04/2019 14:48    Assessment & Plan:   Nevea was seen today for annual exam.  Diagnoses and all orders for this visit:  Need for influenza vaccination -     Flu Vaccine QUAD 36+ mos IM  Erythrocytosis- Her H&H are normal now. -     CBC with Differential/Platelet; Future -     Basic metabolic panel; Future  Depression with anxiety- She is doing well on escitalopram.  Will continue with the current dose.  Her vitamin D level is in the normal range. -     Basic metabolic panel; Future -     TSH; Future -     Hepatic function panel; Future -     VITAMIN D 25 Hydroxy (Vit-D Deficiency, Fractures); Future  Visit for screening mammogram -     MM DIGITAL SCREENING BILATERAL; Future  Routine general medical examination at a health care facility- Exam completed, labs reviewed, vaccines reviewed and  updated, she is referred for screening mammogram, patient education was given. -     Lipid panel; Future   I am having Anette Riedel. Foutz maintain her loratadine, levonorgestrel, gabapentin, meloxicam, Vitamin D (Ergocalciferol), and escitalopram.  No orders of the defined types were placed in this encounter.    Follow-up: Return in about 1 year (around 08/11/2020).  Sanda Linger, MD

## 2019-08-12 NOTE — Patient Instructions (Signed)
Health Maintenance, Female Adopting a healthy lifestyle and getting preventive care are important in promoting health and wellness. Ask your health care provider about:  The right schedule for you to have regular tests and exams.  Things you can do on your own to prevent diseases and keep yourself healthy. What should I know about diet, weight, and exercise? Eat a healthy diet   Eat a diet that includes plenty of vegetables, fruits, low-fat dairy products, and lean protein.  Do not eat a lot of foods that are high in solid fats, added sugars, or sodium. Maintain a healthy weight Body mass index (BMI) is used to identify weight problems. It estimates body fat based on height and weight. Your health care provider can help determine your BMI and help you achieve or maintain a healthy weight. Get regular exercise Get regular exercise. This is one of the most important things you can do for your health. Most adults should:  Exercise for at least 150 minutes each week. The exercise should increase your heart rate and make you sweat (moderate-intensity exercise).  Do strengthening exercises at least twice a week. This is in addition to the moderate-intensity exercise.  Spend less time sitting. Even light physical activity can be beneficial. Watch cholesterol and blood lipids Have your blood tested for lipids and cholesterol at 43 years of age, then have this test every 5 years. Have your cholesterol levels checked more often if:  Your lipid or cholesterol levels are high.  You are older than 43 years of age.  You are at high risk for heart disease. What should I know about cancer screening? Depending on your health history and family history, you may need to have cancer screening at various ages. This may include screening for:  Breast cancer.  Cervical cancer.  Colorectal cancer.  Skin cancer.  Lung cancer. What should I know about heart disease, diabetes, and high blood  pressure? Blood pressure and heart disease  High blood pressure causes heart disease and increases the risk of stroke. This is more likely to develop in people who have high blood pressure readings, are of African descent, or are overweight.  Have your blood pressure checked: ? Every 3-5 years if you are 18-39 years of age. ? Every year if you are 40 years old or older. Diabetes Have regular diabetes screenings. This checks your fasting blood sugar level. Have the screening done:  Once every three years after age 40 if you are at a normal weight and have a low risk for diabetes.  More often and at a younger age if you are overweight or have a high risk for diabetes. What should I know about preventing infection? Hepatitis B If you have a higher risk for hepatitis B, you should be screened for this virus. Talk with your health care provider to find out if you are at risk for hepatitis B infection. Hepatitis C Testing is recommended for:  Everyone born from 1945 through 1965.  Anyone with known risk factors for hepatitis C. Sexually transmitted infections (STIs)  Get screened for STIs, including gonorrhea and chlamydia, if: ? You are sexually active and are younger than 43 years of age. ? You are older than 43 years of age and your health care provider tells you that you are at risk for this type of infection. ? Your sexual activity has changed since you were last screened, and you are at increased risk for chlamydia or gonorrhea. Ask your health care provider if   you are at risk.  Ask your health care provider about whether you are at high risk for HIV. Your health care provider may recommend a prescription medicine to help prevent HIV infection. If you choose to take medicine to prevent HIV, you should first get tested for HIV. You should then be tested every 3 months for as long as you are taking the medicine. Pregnancy  If you are about to stop having your period (premenopausal) and  you may become pregnant, seek counseling before you get pregnant.  Take 400 to 800 micrograms (mcg) of folic acid every day if you become pregnant.  Ask for birth control (contraception) if you want to prevent pregnancy. Osteoporosis and menopause Osteoporosis is a disease in which the bones lose minerals and strength with aging. This can result in bone fractures. If you are 65 years old or older, or if you are at risk for osteoporosis and fractures, ask your health care provider if you should:  Be screened for bone loss.  Take a calcium or vitamin D supplement to lower your risk of fractures.  Be given hormone replacement therapy (HRT) to treat symptoms of menopause. Follow these instructions at home: Lifestyle  Do not use any products that contain nicotine or tobacco, such as cigarettes, e-cigarettes, and chewing tobacco. If you need help quitting, ask your health care provider.  Do not use street drugs.  Do not share needles.  Ask your health care provider for help if you need support or information about quitting drugs. Alcohol use  Do not drink alcohol if: ? Your health care provider tells you not to drink. ? You are pregnant, may be pregnant, or are planning to become pregnant.  If you drink alcohol: ? Limit how much you use to 0-1 drink a day. ? Limit intake if you are breastfeeding.  Be aware of how much alcohol is in your drink. In the [place] one drink equals one 12 oz bottle of beer (355 mL), one 5 oz glass of wine (148 mL), or one 1 oz glass of hard liquor (44 mL). General instructions  Schedule regular health, dental, and eye exams.  Stay current with your vaccines.  Tell your health care provider if: ? You often feel depressed. ? You have ever been abused or do not feel safe at home. Summary  Adopting a healthy lifestyle and getting preventive care are important in promoting health and wellness.  Follow your health care provider's instructions about healthy  diet, exercising, and getting tested or screened for diseases.  Follow your health care provider's instructions on monitoring your cholesterol and blood pressure. This information is not intended to replace advice given to you by your health care provider. Make sure you discuss any questions you have with your health care provider. Document Released: 05/02/2011 Document Revised: 10/10/2018 Document Reviewed: 10/10/2018 Elsevier Patient Education  2020 [employer]  

## 2019-08-13 ENCOUNTER — Encounter: Payer: Self-pay | Admitting: Family Medicine

## 2019-08-13 ENCOUNTER — Other Ambulatory Visit (INDEPENDENT_AMBULATORY_CARE_PROVIDER_SITE_OTHER): Payer: 59

## 2019-08-13 ENCOUNTER — Other Ambulatory Visit: Payer: Self-pay

## 2019-08-13 ENCOUNTER — Encounter: Payer: Self-pay | Admitting: Internal Medicine

## 2019-08-13 DIAGNOSIS — Z0000 Encounter for general adult medical examination without abnormal findings: Secondary | ICD-10-CM

## 2019-08-13 DIAGNOSIS — D751 Secondary polycythemia: Secondary | ICD-10-CM

## 2019-08-13 DIAGNOSIS — F418 Other specified anxiety disorders: Secondary | ICD-10-CM

## 2019-08-13 DIAGNOSIS — Z20822 Contact with and (suspected) exposure to covid-19: Secondary | ICD-10-CM

## 2019-08-13 LAB — BASIC METABOLIC PANEL
BUN: 8 mg/dL (ref 6–23)
CO2: 28 mEq/L (ref 19–32)
Calcium: 9.2 mg/dL (ref 8.4–10.5)
Chloride: 104 mEq/L (ref 96–112)
Creatinine, Ser: 0.53 mg/dL (ref 0.40–1.20)
GFR: 125.51 mL/min (ref >60.00)
Glucose, Bld: 88 mg/dL (ref 70–99)
Potassium: 3.7 mEq/L (ref 3.5–5.1)
Sodium: 139 mEq/L (ref 135–145)

## 2019-08-13 LAB — CBC WITH DIFFERENTIAL/PLATELET
Basophils Absolute: 0.1 10*3/uL (ref 0.0–0.1)
Basophils Relative: 0.9 % (ref 0.0–3.0)
Eosinophils Absolute: 0.2 10*3/uL (ref 0.0–0.7)
Eosinophils Relative: 2.5 % (ref 0.0–5.0)
HCT: 40.5 % (ref 36.0–46.0)
Hemoglobin: 13.5 g/dL (ref 12.0–15.0)
Lymphocytes Relative: 25.5 % (ref 12.0–46.0)
Lymphs Abs: 2.1 10*3/uL (ref 0.7–4.0)
MCHC: 33.3 g/dL (ref 30.0–36.0)
MCV: 90.2 fl (ref 78.0–100.0)
Monocytes Absolute: 0.6 10*3/uL (ref 0.1–1.0)
Monocytes Relative: 7.1 % (ref 3.0–12.0)
Neutro Abs: 5.2 10*3/uL (ref 1.4–7.7)
Neutrophils Relative %: 64 % (ref 43.0–77.0)
Platelets: 214 10*3/uL (ref 150.0–400.0)
RBC: 4.49 Mil/uL (ref 3.87–5.11)
RDW: 13.5 % (ref 11.5–15.5)
WBC: 8.1 10*3/uL (ref 4.0–10.5)

## 2019-08-13 LAB — LIPID PANEL
Cholesterol: 227 mg/dL — ABNORMAL HIGH (ref 0–200)
HDL: 41.7 mg/dL (ref >39.00)
LDL Cholesterol: 147 mg/dL — ABNORMAL HIGH (ref 0–99)
NonHDL: 185.07
Total CHOL/HDL Ratio: 5
Triglycerides: 188 mg/dL — ABNORMAL HIGH (ref 0.0–149.0)
VLDL: 37.6 mg/dL (ref 0.0–40.0)

## 2019-08-13 LAB — HEPATIC FUNCTION PANEL
ALT: 16 U/L (ref 0–35)
AST: 16 U/L (ref 0–37)
Albumin: 4 g/dL (ref 3.5–5.2)
Alkaline Phosphatase: 84 U/L (ref 39–117)
Bilirubin, Direct: 0.1 mg/dL (ref 0.0–0.3)
Total Bilirubin: 0.6 mg/dL (ref 0.2–1.2)
Total Protein: 6.6 g/dL (ref 6.0–8.3)

## 2019-08-13 LAB — VITAMIN D 25 HYDROXY (VIT D DEFICIENCY, FRACTURES): VITD: 37.89 ng/mL (ref 30.00–100.00)

## 2019-08-13 LAB — TSH: TSH: 1.51 u[IU]/mL (ref 0.35–4.50)

## 2019-08-14 LAB — NOVEL CORONAVIRUS, NAA: SARS-CoV-2, NAA: NOT DETECTED

## 2019-08-15 ENCOUNTER — Ambulatory Visit: Payer: PRIVATE HEALTH INSURANCE | Admitting: Family Medicine

## 2019-10-03 ENCOUNTER — Encounter: Payer: Self-pay | Admitting: Internal Medicine

## 2019-10-16 ENCOUNTER — Other Ambulatory Visit: Payer: Self-pay

## 2019-10-16 ENCOUNTER — Ambulatory Visit: Payer: 59 | Attending: Internal Medicine

## 2019-10-16 ENCOUNTER — Ambulatory Visit: Payer: 59 | Admitting: Internal Medicine

## 2019-10-16 ENCOUNTER — Other Ambulatory Visit: Payer: 59

## 2019-10-16 DIAGNOSIS — Z20822 Contact with and (suspected) exposure to covid-19: Secondary | ICD-10-CM

## 2019-10-16 DIAGNOSIS — L853 Xerosis cutis: Secondary | ICD-10-CM | POA: Insufficient documentation

## 2019-10-16 NOTE — Progress Notes (Signed)
Virtual Visit via Video Note  I connected with Danielle Cook on 10/16/19 at 10:00 AM EST by a video enabled telemedicine application and verified that I am speaking with the correct person using two identifiers.   I discussed the limitations of evaluation and management by telemedicine and the availability of in person appointments. The patient expressed understanding and agreed to proceed.  Present for the visit:  Myself, Dr Billey Gosling, Janeece Fitting.  The patient is currently at home and I am in the office.    No referring provider.    History of Present Illness: This is an acute visit for thin, dry, cracking skin on hands.  She has a history of very dry, cracking skin on her hands typically that occurs in the winter.  The skin will become very red and inflamed and painful.  She works in [employer] and has been washing her hands a lot and using hand sanitizer.  She often will have to work 6 days in a row and at the end of that stent her hands will be very bad.  They will heal after that until she returns to work again.  She does use good lotions, but in the past has needed to use a steroid cream and typically only takes 1 or 2 treatments to heal up quickly.  She would like to have a refill of that.  Currently her hands are looking good.  She has not had any fevers or chills.   Social History   Socioeconomic History  . Marital status: Married    Spouse name: Not on file  . Number of children: Not on file  . Years of education: Not on file  . Highest education level: Not on file  Occupational History  . Not on file  Tobacco Use  . Smoking status: Current Every Day Smoker    Packs/day: 0.25    Years: 15.00    Pack years: 3.75    Types: E-cigarettes  . Smokeless tobacco: Never Used  Substance and Sexual Activity  . Alcohol use: No    Comment: few glasses of wine during pregnancy  . Drug use: No  . Sexual activity: Yes    Partners: Male  Other Topics Concern  . Not on  file  Social History Narrative  . Not on file   Social Determinants of Health   Financial Resource Strain:   . Difficulty of Paying Living Expenses: Not on file  Food Insecurity:   . Worried About Charity fundraiser in the Last Year: Not on file  . Ran Out of Food in the Last Year: Not on file  Transportation Needs:   . Lack of Transportation (Medical): Not on file  . Lack of Transportation (Non-Medical): Not on file  Physical Activity:   . Days of Exercise per Week: Not on file  . Minutes of Exercise per Session: Not on file  Stress:   . Feeling of Stress : Not on file  Social Connections:   . Frequency of Communication with Friends and Family: Not on file  . Frequency of Social Gatherings with Friends and Family: Not on file  . Attends Religious Services: Not on file  . Active Member of Clubs or Organizations: Not on file  . Attends Archivist Meetings: Not on file  . Marital Status: Not on file     Observations/Objective: Appears well in NAD Hands appear normal-no erythema, no swelling and no cracks in skin.  She has been off  work for a few days which is why they are looking well.  Assessment and Plan:  See Problem List for Assessment and Plan of chronic medical problems.   Follow Up Instructions:    I discussed the assessment and treatment plan with the patient. The patient was provided an opportunity to ask questions and all were answered. The patient agreed with the plan and demonstrated an understanding of the instructions.   The patient was advised to call back or seek an in-person evaluation if the symptoms worsen or if the condition fails to improve as anticipated.    Pincus Sanes, MD

## 2019-10-16 NOTE — Assessment & Plan Note (Addendum)
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

## 2019-10-17 ENCOUNTER — Other Ambulatory Visit: Payer: Self-pay | Admitting: Family Medicine

## 2019-10-17 ENCOUNTER — Ambulatory Visit (INDEPENDENT_AMBULATORY_CARE_PROVIDER_SITE_OTHER): Payer: 59 | Admitting: Internal Medicine

## 2019-10-17 ENCOUNTER — Encounter: Payer: Self-pay | Admitting: Internal Medicine

## 2019-10-17 DIAGNOSIS — L853 Xerosis cutis: Secondary | ICD-10-CM

## 2019-10-17 MED ORDER — BETAMETHASONE DIPROPIONATE AUG 0.05 % EX CREA: TOPICAL_CREAM | Freq: Two times a day (BID) | CUTANEOUS | 0 refills | Status: DC

## 2019-10-19 LAB — NOVEL CORONAVIRUS, NAA: SARS-CoV-2, NAA: NOT DETECTED

## 2019-12-06 ENCOUNTER — Encounter: Payer: Self-pay | Admitting: Family Medicine

## 2019-12-06 ENCOUNTER — Ambulatory Visit (INDEPENDENT_AMBULATORY_CARE_PROVIDER_SITE_OTHER): Payer: 59

## 2019-12-06 ENCOUNTER — Other Ambulatory Visit: Payer: Self-pay

## 2019-12-06 ENCOUNTER — Ambulatory Visit (INDEPENDENT_AMBULATORY_CARE_PROVIDER_SITE_OTHER): Payer: 59 | Admitting: Family Medicine

## 2019-12-06 VITALS — BP 140/90 | HR 59 | Ht 64.0 in | Wt 230.0 lb

## 2019-12-06 DIAGNOSIS — G5601 Carpal tunnel syndrome, right upper limb: Secondary | ICD-10-CM | POA: Diagnosis not present

## 2019-12-06 DIAGNOSIS — M25531 Pain in right wrist: Secondary | ICD-10-CM

## 2019-12-06 DIAGNOSIS — M7061 Trochanteric bursitis, right hip: Secondary | ICD-10-CM | POA: Diagnosis not present

## 2019-12-06 MED ORDER — GABAPENTIN 100 MG PO CAPS: 200.0000 mg | ORAL_CAPSULE | Freq: Every day | ORAL | 3 refills | Status: DC

## 2019-12-06 NOTE — Assessment & Plan Note (Signed)
Based on area under ultrasound it appears the patient probably has moderate to severe.  Patient does not have any thenar eminence wasting at the time.  Discussed with patient about icing regimen, home exercise, which activities to do which wants to avoid.  Patient should increase activity slowly over the course the next several weeks.  Follow-up with me again in 4 to 8 weeks bracing at night

## 2019-12-06 NOTE — Progress Notes (Signed)
[name] Phone: [phone] Subjective:   [name], am serving as a scribe for Dr. Hulan Saas. This visit occurred during the SARS-CoV-2 public health emergency.  Safety protocols were in place, including screening questions prior to the visit, additional usage of staff PPE, and extensive cleaning of exam room while observing appropriate contact time as indicated for disinfecting solutions.   I'm seeing this patient by the request  of:  Danielle Lima, MD  CC:  Right wrist pain   OAC:[id number]  Danielle Cook is a 44 y.o. female coming in with complaint of right wrist pain. Last seen in September 2020 for back and hip pain. Patient states her fingers feel "asleep" all the time. Wears a brace at night that helps the pain. States there is not any weakness but she can't feel her fingers. Been going on for a week. Had gestational carpel tunnel and wore day and night braces. Right wrist never fully recovered.       Past Medical History:  Diagnosis Date  . Abnormal Pap smear   . Allergy   . History of chicken pox   . 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 Hyperlipidemia   . Migraine   . PPD positive, treated 2004   Past Surgical History:  Procedure Laterality Date  . CHOLECYSTECTOMY, LAPAROSCOPIC  07/13/2000  . COLPOSCOPY  2000,   . TONSILLECTOMY    . [place] EXTRACTION  1995 Jan   Social History   Socioeconomic History  . Marital status: Married    Spouse name: Not on file  . Number of children: Not on file  . Years of education: Not on file  . Highest education level: Not on file  Occupational History  . Not on file  Tobacco Use  . Smoking status: Current Every Day Smoker    Packs/day: 0.25    Years: 15.00    Pack years: 3.75    Types: E-cigarettes  . Smokeless tobacco: Never Used  Substance and Sexual Activity  . Alcohol use: No    Comment: few glasses of wine during pregnancy  . Drug  use: No  . Sexual activity: Yes    Partners: Male  Other Topics Concern  . Not on file  Social History Narrative  . Not on file   Social Determinants of Health   Financial Resource Strain:   . Difficulty of Paying Living Expenses: Not on file  Food Insecurity:   . Worried About Charity fundraiser in the Last Year: Not on file  . Ran Out of Food in the Last Year: Not on file  Transportation Needs:   . Lack of Transportation (Medical): Not on file  . Lack of Transportation (Non-Medical): Not on file  Physical Activity:   . Days of Exercise per Week: Not on file  . Minutes of Exercise per Session: Not on file  Stress:   . Feeling of Stress : Not on file  Social Connections:   . Frequency of Communication with Friends and Family: Not on file  . Frequency of Social Gatherings with Friends and Family: Not on file  . Attends Religious Services: Not on file  . Active Member of Clubs or Organizations: Not on file  . Attends Archivist Meetings: Not on file  . Marital Status: Not on file   Allergies  Allergen Reactions  . Eggs Or Egg-Derived Products   . Guaifenesin Er  Anxiety and Palpitations  . Latex Hives and Rash  . Wellbutrin [Bupropion Hcl] Anxiety and Palpitations   Family History  Problem Relation Age of Onset  . Stroke Mother   . Dementia Mother   . Hyperlipidemia Mother   . Early death Father   . Heart disease Father   . Hyperlipidemia Father   . Cancer Neg Hx   . Depression Neg Hx   . Diabetes Neg Hx   . Drug abuse Neg Hx   . Hearing loss Neg Hx   . Hypertension Neg Hx     Current Outpatient Medications (Endocrine & Metabolic):  .  levonorgestrel (MIRENA, 52 MG,) 20 MCG/24HR IUD, 1 Intra Uterine Device (1 each total) by Intrauterine route once.   Current Outpatient Medications (Respiratory):  .  loratadine (CLARITIN) 10 MG tablet, Take 10 mg by mouth every morning.    Current Outpatient Medications (Analgesics):  .  meloxicam (MOBIC) 15 MG  tablet, Take 1 tablet (15 mg total) by mouth daily.   Current Outpatient Medications (Other):  .  augmented betamethasone dipropionate (DIPROLENE AF) 0.05 % cream, Apply topically 2 (two) times daily. Marland Kitchen  escitalopram (LEXAPRO) 20 MG tablet, TAKE 1 TABLET BY MOUTH EVERY DAY .  gabapentin (NEURONTIN) 100 MG capsule, Take 2 capsules (200 mg total) by mouth at bedtime. .  Vitamin D, Ergocalciferol, (DRISDOL) 1.25 MG (50000 UT) CAPS capsule, TAKE 1 CAPSULE BY MOUTH EVERY 7 DAYS .  gabapentin (NEURONTIN) 100 MG capsule, Take 2 capsules (200 mg total) by mouth at bedtime.   Reviewed prior external information including notes and imaging from  primary care provider As well as notes that were available from care everywhere and other healthcare systems.  Past medical history, social, surgical and family history all reviewed in electronic medical record.  No pertanent information unless stated regarding to the chief complaint.   Review of Systems:  No headache, visual changes, nausea, vomiting, diarrhea, constipation, dizziness, abdominal pain, skin rash, fevers, chills, night sweats, weight loss, swollen lymph nodes, body aches, joint swelling, chest pain, shortness of breath, mood changes. POSITIVE muscle aches  Objective  Blood pressure 140/90, pulse (!) 59, height 5\' 4"  (1.626 m), weight 230 lb (104.3 kg), SpO2 98 %.   General: No apparent distress alert and oriented x3 mood and affect normal, dressed appropriately.  HEENT: Pupils equal, extraocular movements intact  Respiratory: Patient's speak in full sentences and does not appear short of breath  Cardiovascular: No lower extremity edema, non tender, no erythema  Skin: Warm dry intact with no signs of infection or rash on extremities or on axial skeleton.  Abdomen: Soft nontender  Neuro: Cranial nerves II through XII are intact, neurovascularly intact in all extremities with 2+ DTRs and 2+ pulses.  Lymph: No lymphadenopathy of posterior or  anterior cervical chain or axillae bilaterally.  Gait normal with good balance and coordination.  MSK:  Non tender with full range of motion and good stability and symmetric strength and tone of shoulders, elbows,   knee and ankles bilaterally.  Right wrist exam shows the patient does have some tender to palpation over the ulnar aspect of the wrist.  Positive Tinel's and Phalen's.  Full range of motion of the wrist.  Good grip strength still noted.  Contralateral wrist unremarkable  Right hip exam shows severe tenderness to palpation over the greater trochanteric area.  Negative straight leg test, positive FABER test.  Full range of motion of the hip itself.  Procedure: Real-time Ultrasound Guided  Injection of right carpal tunnel Device: GE Logiq Q7  Ultrasound guided injection is preferred based studies that show increased duration, increased effect, greater accuracy, decreased procedural pain, increased response rate with ultrasound guided versus blind injection.  Verbal informed consent obtained.  Time-out conducted.  Noted no overlying erythema, induration, or other signs of local infection.  Skin prepped in a sterile fashion.  Local anesthesia: Topical Ethyl chloride.  With sterile technique and under real time ultrasound guidance:  median nerve visualized.  23g 5/8 inch needle inserted distal to proximal approach into nerve sheath. Pictures taken nfor needle placement. Patient did have injection of 2 cc of 1% lidocaine, 1 cc of 0.5% Marcaine, and 1 cc of Kenalog 40 mg/dL. Completed without difficulty  Pain immediately resolved suggesting accurate placement of the medication.  Advised to call if fevers/chills, erythema, induration, drainage, or persistent bleeding.  Images permanently stored and available for review in the ultrasound unit.  Impression: Technically successful ultrasound guided injection.   Procedure: Real-time Ultrasound Guided Injection of right greater trochanteric  bursitis secondary to patient's body habitus Device: GE Logiq Q7 Ultrasound guided injection is preferred based studies that show increased duration, increased effect, greater accuracy, decreased procedural pain, increased response rate, and decreased cost with ultrasound guided versus blind injection.  Verbal informed consent obtained.  Time-out conducted.  Noted no overlying erythema, induration, or other signs of local infection.  Skin prepped in a sterile fashion.  Local anesthesia: Topical Ethyl chloride.  With sterile technique and under real time ultrasound guidance:  Greater trochanteric area was visualized and patient's bursa was noted. A 22-gauge 3 inch needle was inserted and 4 cc of 0.5% Marcaine and 1 cc of Kenalog 40 mg/dL was injected. Pictures taken Completed without difficulty  Pain immediately resolved suggesting accurate placement of the medication.  Advised to call if fevers/chills, erythema, induration, drainage, or persistent bleeding.  Images permanently stored and available for review in the ultrasound unit.  Impression: Technically successful ultrasound guided injection.   Impression and Recommendations:     This case required medical decision making of moderate complexity. The above documentation has been reviewed and is accurate and complete Judi Saa, DO       Note: This dictation was prepared with Dragon dictation along with smaller phrase technology. Any transcriptional errors that result from this process are unintentional.

## 2019-12-06 NOTE — Assessment & Plan Note (Signed)
Right-sided injected today, tolerated the procedure well.  Has responded well to triptan's previously.  Last year ago.  We discussed icing regimen, home exercise, importance of weight loss, and gabapentin.  Follow-up again in 4 to 8 weeks.

## 2019-12-06 NOTE — Patient Instructions (Addendum)
Good to see you Gabapentin 200 mg at night  Exercises 3 times a week.   Brace day and night for a week then nightly for 2 weeks See me again in 6 weeks

## 2019-12-13 ENCOUNTER — Encounter: Payer: Self-pay | Admitting: Family Medicine

## 2020-01-17 ENCOUNTER — Encounter: Payer: Self-pay | Admitting: Family Medicine

## 2020-01-17 ENCOUNTER — Ambulatory Visit: Payer: 59 | Admitting: Family Medicine

## 2020-01-17 ENCOUNTER — Other Ambulatory Visit: Payer: Self-pay

## 2020-01-17 VITALS — BP 112/74 | HR 66 | Ht 64.0 in | Wt 230.0 lb

## 2020-01-17 DIAGNOSIS — G5601 Carpal tunnel syndrome, right upper limb: Secondary | ICD-10-CM

## 2020-01-17 MED ORDER — PREDNISONE 50 MG PO TABS: ORAL_TABLET | ORAL | 0 refills | Status: DC

## 2020-01-17 NOTE — Progress Notes (Signed)
[facility] [place] Phone: [phone] Subjective:   Fontaine No, am serving as a scribe for Dr. Hulan Saas. This visit occurred during the SARS-CoV-2 public health emergency.  Safety protocols were in place, including screening questions prior to the visit, additional usage of staff PPE, and extensive cleaning of exam room while observing appropriate contact time as indicated for disinfecting solutions.   I'm seeing this patient by the request  of:  [name], MD  CC: right wrist pain   [id number]   12/06/2019 Based on area under ultrasound it appears the patient probably has moderate to severe.  Patient does not have any thenar eminence wasting at the time.  Discussed with patient about icing regimen, home exercise, which activities to do which wants to avoid.  Patient should increase activity slowly over the course the next several weeks.  Follow-up with me again in 4 to 8 weeks bracing at night  Right-sided injected today, tolerated the procedure well.  Has responded well to triptan's previously.  Last year ago.  We discussed icing regimen, home exercise, importance of weight loss, and gabapentin.  Follow-up again in 4 to 8 weeks.   Update 01/17/2020 Huntleigh Doolen Montanye is a 44 y.o. female coming in with complaint of right wrist and right hip pain. Patient states that she is having numbness in all of her fingers. Unable to tell if symptoms are improving. Feels injection did help somewhat.   Has been using splint at night.  Patient states that unfortunately is affecting daily activities and even waking her up at night.  Patient is concerned that this will affect her work care significant changes to worsen.      Past Medical History:  Diagnosis Date  . Abnormal Pap smear   . Allergy   . History of chicken pox   . 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 Hyperlipidemia   . Migraine   . PPD positive, treated 2004   Past  Surgical History:  Procedure Laterality Date  . CHOLECYSTECTOMY, LAPAROSCOPIC  07/13/2000  . COLPOSCOPY  2000,   . TONSILLECTOMY    . [place] EXTRACTION  1995 Jan   Social History   Socioeconomic History  . Marital status: Married    Spouse name: Not on file  . Number of children: Not on file  . Years of education: Not on file  . Highest education level: Not on file  Occupational History  . Not on file  Tobacco Use  . Smoking status: Current Every Day Smoker    Packs/day: 0.25    Years: 15.00    Pack years: 3.75    Types: E-cigarettes  . Smokeless tobacco: Never Used  Substance and Sexual Activity  . Alcohol use: No    Comment: few glasses of wine during pregnancy  . Drug use: No  . Sexual activity: Yes    Partners: Male  Other Topics Concern  . Not on file  Social History Narrative  . Not on file   Social Determinants of Health   Financial Resource Strain:   . Difficulty of Paying Living Expenses:   Food Insecurity:   . Worried About Charity fundraiser in the Last Year:   . Arboriculturist in the Last Year:   Transportation Needs:   . Film/video editor (Medical):   Marland Kitchen Lack of Transportation (Non-Medical):   Physical Activity:   . Days of Exercise per  Week:   . Minutes of Exercise per Session:   Stress:   . Feeling of Stress :   Social Connections:   . Frequency of Communication with Friends and Family:   . Frequency of Social Gatherings with Friends and Family:   . Attends Religious Services:   . Active Member of Clubs or Organizations:   . Attends Banker Meetings:   Marland Kitchen Marital Status:    Allergies  Allergen Reactions  . Eggs Or Egg-Derived Products   . Guaifenesin Er Anxiety and Palpitations  . Latex Hives and Rash  . Wellbutrin [Bupropion Hcl] Anxiety and Palpitations   Family History  Problem Relation Age of Onset  . Stroke Mother   . Dementia Mother   . Hyperlipidemia Mother   . Early death Father   . Heart disease  Father   . Hyperlipidemia Father   . Cancer Neg Hx   . Depression Neg Hx   . Diabetes Neg Hx   . Drug abuse Neg Hx   . Hearing loss Neg Hx   . Hypertension Neg Hx     Current Outpatient Medications (Endocrine & Metabolic):  .  levonorgestrel (MIRENA, 52 MG,) 20 MCG/24HR IUD, 1 Intra Uterine Device (1 each total) by Intrauterine route once. .  predniSONE (DELTASONE) 50 MG tablet, Take one tablet daily for the next 5 days.   Current Outpatient Medications (Respiratory):  .  loratadine (CLARITIN) 10 MG tablet, Take 10 mg by mouth every morning.    Current Outpatient Medications (Analgesics):  .  meloxicam (MOBIC) 15 MG tablet, Take 1 tablet (15 mg total) by mouth daily.   Current Outpatient Medications (Other):  .  augmented betamethasone dipropionate (DIPROLENE AF) 0.05 % cream, Apply topically 2 (two) times daily. Marland Kitchen  escitalopram (LEXAPRO) 20 MG tablet, TAKE 1 TABLET BY MOUTH EVERY DAY .  gabapentin (NEURONTIN) 100 MG capsule, Take 2 capsules (200 mg total) by mouth at bedtime. .  gabapentin (NEURONTIN) 100 MG capsule, Take 2 capsules (200 mg total) by mouth at bedtime. .  Vitamin D, Ergocalciferol, (DRISDOL) 1.25 MG (50000 UT) CAPS capsule, TAKE 1 CAPSULE BY MOUTH EVERY 7 DAYS   Reviewed prior external information including notes and imaging from  primary care provider As well as notes that were available from care everywhere and other healthcare systems.  Past medical history, social, surgical and family history all reviewed in electronic medical record.  No pertanent information unless stated regarding to the chief complaint.   Review of Systems:  No headache, visual changes, nausea, vomiting, diarrhea, constipation, dizziness, abdominal pain, skin rash, fevers, chills, night sweats, weight loss, swollen lymph nodes, body aches, joint swelling, chest pain, shortness of breath, mood changes. POSITIVE muscle aches  Objective  Blood pressure 112/74, pulse 66, height 5\' 4"   (1.626 m), weight 230 lb (104.3 kg), SpO2 97 %.   General: No apparent distress alert and oriented x3 mood and affect normal, dressed appropriately.  HEENT: Pupils equal, extraocular movements intact  Respiratory: Patient's speak in full sentences and does not appear short of breath  Right wrist exam shows the patient still has of severely positive Tinel's at the moment.  Patient does have actually some thenar eminence wasting noted.  Patient's grip strength though with does still appear to be full.  Good range of motion of the wrist noted.    Impression and Recommendations:     The above documentation has been reviewed and is accurate and complete , DO

## 2020-01-17 NOTE — Assessment & Plan Note (Signed)
Patient does have significant swelling and is starting to have some mild thenar eminence wasting on the right side.  I believe at this time patient would benefit more from surgical intervention for carpal tunnel than truly another injection.  Will refer today.

## 2020-01-17 NOTE — Patient Instructions (Signed)
Prednisone for next 5 days Keep doing brace Gramig's office will call you Increase gabapentin 300mg  See me in 4 weeks if we are delaying surgery

## 2020-01-20 ENCOUNTER — Encounter: Payer: Self-pay | Admitting: Family Medicine

## 2020-01-21 ENCOUNTER — Other Ambulatory Visit: Payer: Self-pay | Admitting: Family Medicine

## 2020-01-31 ENCOUNTER — Other Ambulatory Visit: Payer: Self-pay | Admitting: Internal Medicine

## 2020-01-31 DIAGNOSIS — F418 Other specified anxiety disorders: Secondary | ICD-10-CM

## 2020-02-14 ENCOUNTER — Encounter: Payer: Self-pay | Admitting: Family Medicine

## 2020-02-14 ENCOUNTER — Other Ambulatory Visit: Payer: Self-pay

## 2020-02-14 ENCOUNTER — Ambulatory Visit (INDEPENDENT_AMBULATORY_CARE_PROVIDER_SITE_OTHER): Payer: 59

## 2020-02-14 ENCOUNTER — Ambulatory Visit: Payer: 59 | Admitting: Family Medicine

## 2020-02-14 VITALS — BP 122/82 | HR 82 | Ht 64.0 in | Wt 223.0 lb

## 2020-02-14 DIAGNOSIS — M25531 Pain in right wrist: Secondary | ICD-10-CM | POA: Diagnosis not present

## 2020-02-14 DIAGNOSIS — M7061 Trochanteric bursitis, right hip: Secondary | ICD-10-CM

## 2020-02-14 DIAGNOSIS — G5601 Carpal tunnel syndrome, right upper limb: Secondary | ICD-10-CM | POA: Diagnosis not present

## 2020-02-14 NOTE — Assessment & Plan Note (Signed)
Chronic problem, repeat injection given.  Discussed home exercises and icing regimen, which activities do which wants to avoid.  Increase activity slowly over the course the next several weeks.  Patient could have more of a radicular symptoms and may need further work-up.  Did refill gabapentin.  Follow-up again in 4 to 8 weeks.

## 2020-02-14 NOTE — Assessment & Plan Note (Signed)
Repeat injection given today.  Patient is in diameter out of her median nerve is significantly larger than average at 1.7 cm.  Which would include patient has severe carpal tunnel.  Patient symptoms to correspond with this.  Discussed home exercises, icing regimen, which activities to doing which wants to avoid.  Patient is to increase activity slowly over the course of the next several weeks.  Patient is following up with a hand surgeon in the near future.

## 2020-02-14 NOTE — Patient Instructions (Signed)
You know where I am See me in 10 weeks otherwise for another injection of hip

## 2020-02-14 NOTE — Progress Notes (Signed)
[facility] [address] [place] [address] Phone: [phone] Subjective:   Danielle Cook, am serving as a scribe for Dr. Antoine Primas. This visit occurred during the SARS-CoV-2 public health emergency.  Safety protocols were in place, including screening questions prior to the visit, additional usage of staff PPE, and extensive cleaning of exam room while observing appropriate contact time as indicated for disinfecting solutions.   I'm seeing this patient by the request  of:  Etta Grandchild, MD  CC: Hip pain, wrist pain  [id number]   3/19/20201 Patient does have significant swelling and is starting to have some mild thenar eminence wasting on the right side.  I believe at this time patient would benefit more from surgical intervention for carpal tunnel than truly another injection.  Will refer today.  Update 02/14/2020 Danielle Cook is a 44 y.o. female coming in with complaint of right wrist pain and right hip pain. Patient states she has an appointment at the end of May for carpal tunnel. Middle finger is numb.   Continues to have issues with lateral aspect of right hip.  Waking her up at night, and continuing to 3 pounds overall.  Not as much pain in the lower back       Past Medical History:  Diagnosis Date  . Abnormal Pap smear   . Allergy   . History of chicken pox   . 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 Hyperlipidemia   . Migraine   . PPD positive, treated 2004   Past Surgical History:  Procedure Laterality Date  . CHOLECYSTECTOMY, LAPAROSCOPIC  07/13/2000  . COLPOSCOPY  2000,   . TONSILLECTOMY    . WISDOM TOOTH EXTRACTION  1995 Jan   Social History   Socioeconomic History  . Marital status: Married    Spouse name: Not on file  . Number of children: Not on file  . Years of education: Not on file  . Highest education level: Not on file  Occupational History  . Not on file  Tobacco Use  . Smoking status: Current Every  Day Smoker    Packs/day: 0.25    Years: 15.00    Pack years: 3.75    Types: E-cigarettes  . Smokeless tobacco: Never Used  Substance and Sexual Activity  . Alcohol use: No    Comment: few glasses of wine during pregnancy  . Drug use: No  . Sexual activity: Yes    Partners: Male  Other Topics Concern  . Not on file  Social History Narrative  . Not on file   Social Determinants of Health   Financial Resource Strain:   . Difficulty of Paying Living Expenses:   Food Insecurity:   . Worried About Programme researcher, broadcasting/film/video in the Last Year:   . Barista in the Last Year:   Transportation Needs:   . Freight forwarder (Medical):   Marland Kitchen Lack of Transportation (Non-Medical):   Physical Activity:   . Days of Exercise per Week:   . Minutes of Exercise per Session:   Stress:   . Feeling of Stress :   Social Connections:   . Frequency of Communication with Friends and Family:   . Frequency of Social Gatherings with Friends and Family:   . Attends Religious Services:   . Active Member of Clubs or Organizations:   . Attends Banker Meetings:   Marland Kitchen Marital Status:    Allergies  Allergen Reactions  . Eggs Or Egg-Derived Products   . Guaifenesin Er Anxiety and Palpitations  . Latex Hives and Rash  . Wellbutrin [Bupropion Hcl] Anxiety and Palpitations   Family History  Problem Relation Age of Onset  . Stroke Mother   . Dementia Mother   . Hyperlipidemia Mother   . Early death Father   . Heart disease Father   . Hyperlipidemia Father   . Cancer Neg Hx   . Depression Neg Hx   . Diabetes Neg Hx   . Drug abuse Neg Hx   . Hearing loss Neg Hx   . Hypertension Neg Hx     Current Outpatient Medications (Endocrine & Metabolic):  .  levonorgestrel (MIRENA, 52 MG,) 20 MCG/24HR IUD, 1 Intra Uterine Device (1 each total) by Intrauterine route once. .  predniSONE (DELTASONE) 50 MG tablet, Take one tablet daily for the next 5 days.   Current Outpatient Medications  (Respiratory):  .  loratadine (CLARITIN) 10 MG tablet, Take 10 mg by mouth every morning.    Current Outpatient Medications (Analgesics):  .  meloxicam (MOBIC) 15 MG tablet, Take 1 tablet (15 mg total) by mouth daily.   Current Outpatient Medications (Other):  .  augmented betamethasone dipropionate (DIPROLENE AF) 0.05 % cream, Apply topically 2 (two) times daily. Marland Kitchen  escitalopram (LEXAPRO) 20 MG tablet, TAKE 1 TABLET BY MOUTH EVERY DAY .  gabapentin (NEURONTIN) 100 MG capsule, Take 2 capsules (200 mg total) by mouth at bedtime. .  gabapentin (NEURONTIN) 100 MG capsule, Take 2 capsules (200 mg total) by mouth at bedtime. .  Vitamin D, Ergocalciferol, (DRISDOL) 1.25 MG (50000 UNIT) CAPS capsule, TAKE 1 CAPSULE BY MOUTH EVERY 7 DAYS   Reviewed prior external information including notes and imaging from  primary care provider As well as notes that were available from care everywhere and other healthcare systems.  Past medical history, social, surgical and family history all reviewed in electronic medical record.  No pertanent information unless stated regarding to the chief complaint.   Review of Systems:  No headache, visual changes, nausea, vomiting, diarrhea, constipation, dizziness, abdominal pain, skin rash, fevers, chills, night sweats, weight loss, swollen lymph nodes, body aches, joint swelling, chest pain, shortness of breath, mood changes. POSITIVE muscle aches  Objective  Blood pressure 122/82, pulse 82, height 5\' 4"  (1.626 m), weight 223 lb (101.2 kg), SpO2 98 %.   General: No apparent distress alert and oriented x3 mood and affect normal, dressed appropriately.  HEENT: Pupils equal, extraocular movements intact  Respiratory: Patient's speak in full sentences and does not appear short of breath  Cardiovascular: No lower extremity edema, non tender, no erythema  Neuro: Cranial nerves II through XII are intact, neurovascularly intact in all extremities with 2+ DTRs and 2+ pulses.   Gait normal with good balance and coordination.  MSK: Right wrist exam shows the patient does have a positive Tinel's.  Patient is morning comes to index finger, middle finger on the right hand.  No thenar eminence wasting noted.  Grip strength is nearly as strong as the contralateral side.  Right hip exam shows the patient is severely tender over the greater trochanteric area.  Difficulty even with Corky Sox secondary to knee pain.  Negative straight leg test.   Procedure: Real-time Ultrasound Guided Injection of right greater trochanteric bursitis secondary to patient's body habitus Device: GE Logiq Q7 Ultrasound guided injection is preferred based studies that show increased duration, increased effect, greater accuracy, decreased procedural pain, increased  response rate, and decreased cost with ultrasound guided versus blind injection.  Verbal informed consent obtained.  Time-out conducted.  Noted no overlying erythema, induration, or other signs of local infection.  Skin prepped in a sterile fashion.  Local anesthesia: Topical Ethyl chloride.  With sterile technique and under real time ultrasound guidance:  Greater trochanteric area was visualized and patient's bursa was noted. A 22-gauge 3 inch needle was inserted and 4 cc of 0.5% Marcaine and 1 cc of Kenalog 40 mg/dL was injected. Pictures taken Completed without difficulty  Pain immediately resolved suggesting accurate placement of the medication.  Advised to call if fevers/chills, erythema, induration, drainage, or persistent bleeding.  Images permanently stored and available for review in the ultrasound unit.  Impression: Technically successful ultrasound guided injection.  Procedure: Real-time Ultrasound Guided Injection of right carpal tunnel Device: GE Logiq Q7  Ultrasound guided injection is preferred based studies that show increased duration, increased effect, greater accuracy, decreased procedural pain, increased response rate  with ultrasound guided versus blind injection.  Verbal informed consent obtained.  Time-out conducted.  Noted no overlying erythema, induration, or other signs of local infection.  Skin prepped in a sterile fashion.  Local anesthesia: Topical Ethyl chloride.  With sterile technique and under real time ultrasound guidance:  median nerve visualized.  23g 5/8 inch needle inserted distal to proximal approach into nerve sheath. Pictures taken nfor needle placement. Patient did have injection of 2 cc of 1% lidocaine, 1 cc of 0.5% Marcaine, and 1 cc of Kenalog 40 mg/dL. Completed without difficulty  Pain immediately resolved suggesting accurate placement of the medication.  Advised to call if fevers/chills, erythema, induration, drainage, or persistent bleeding.  Images permanently stored and available for review in the ultrasound unit.  Impression: Technically successful ultrasound guided injection.     Impression and Recommendations:     This case required medical decision making of moderate complexity. The above documentation has been reviewed and is accurate and complete Judi Saa, DO       Note: This dictation was prepared with Dragon dictation along with smaller phrase technology. Any transcriptional errors that result from this process are unintentional.

## 2020-02-23 ENCOUNTER — Encounter: Payer: Self-pay | Admitting: Family Medicine

## 2020-02-24 ENCOUNTER — Ambulatory Visit: Payer: 59 | Admitting: Family Medicine

## 2020-02-24 ENCOUNTER — Ambulatory Visit (INDEPENDENT_AMBULATORY_CARE_PROVIDER_SITE_OTHER): Payer: 59

## 2020-02-24 ENCOUNTER — Other Ambulatory Visit: Payer: Self-pay

## 2020-02-24 ENCOUNTER — Ambulatory Visit: Payer: Self-pay

## 2020-02-24 ENCOUNTER — Encounter: Payer: Self-pay | Admitting: Family Medicine

## 2020-02-24 VITALS — BP 138/86 | HR 76 | Ht 64.0 in | Wt 225.0 lb

## 2020-02-24 DIAGNOSIS — M25562 Pain in left knee: Secondary | ICD-10-CM | POA: Diagnosis not present

## 2020-02-24 NOTE — Progress Notes (Signed)
I, Christoper Fabian, LAT, ATC, am serving as scribe for Dr. Clementeen Graham.  Danielle Cook is a 44 y.o. female who presents to [facility] at [facility] today for new L knee pain after sustaining an injury while at the trampoline park on 02[place]22[place]20.  She states that she jumped from one trampoline to another and landed w a valgus stress to her L knee and heard a "pop."  She locates her pain to her post and med/lat knee.  She rates her pain as moderate and describers her pain as aching.  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   MSK: Left knee: Large effusion otherwise normal-appearing Not particularly tender to palpation. Range of motion 0-90 degrees. No laxity with valgus or varus stress test. Guarding with anterior drawer testing no firm endpoint felt.  Nondiagnostic. Guarding positive McMurray's test medial and lateral. Decreased flexion extension strength with pain.    Lab and Radiology Results  X-ray images left knee obtained today personally and independently reviewed. No acute fractures.  Mild degenerative changes.  Loose body present posterior knee. Await formal radiology review    Assessment and Plan: 44 y.o. female with left knee injury associated with rapid effusion and pain.  Based on the description of her mechanics of the injury I am concerned she suffered an ACL tear.  It is also likely that she has a meniscus tear.  She suffered a twisting pop in her knee  followed rapidly by effusion and pain.  Her symptoms are bad enough that she is having trouble walking normally and  is not able to work at this time.  Plan to proceed directly to MRI for surgical planning.  X-ray obtained today await formal radiology review. Recheck after MRI.  Dispensed hinged knee brace and recommend using crutches or cane as needed.   PDMP not reviewed this encounter. Orders Placed This Encounter  Procedures  . DG Knee AP/LAT W/Sunrise Left    Standing Status:   Future    Number of Occurrences:   1    Standing Expiration Date:   04/25/2021    Order Specific Question:   Reason for Exam (SYMPTOM  OR DIAGNOSIS REQUIRED)    Answer:   eval knee pain left    Order Specific Question:   Is patient pregnant?    Answer:   No    Order Specific Question:   Preferred imaging location?    Answer:   04/27/2021    Order Specific Question:   Radiology Contrast Protocol - do NOT remove file path    Answer:   \\[email or web address]  . MR Knee Left  Wo Contrast    Standing Status:   Future    Standing Expiration Date:   04/25/2021    Order Specific Question:   ** REASON FOR EXAM (FREE TEXT)    Answer:   eval poss ACL tear    Order Specific Question:  What is the patient's sedation requirement?    Answer:   Anti-anxiety    Order Specific Question:   Does the patient have a pacemaker or implanted devices?    Answer:   No    Order Specific Question:   Preferred imaging location?    Answer:   Product/process development scientist (table limit-350lbs)    Order Specific Question:   Radiology Contrast Protocol - do NOT remove file path    Answer:   \\[email or web address]   No orders of the defined types were placed in this encounter.    Discussed warning signs or symptoms. Please see discharge instructions. Patient expresses understanding.   The above documentation has been reviewed and is accurate and complete Danielle Cook

## 2020-02-24 NOTE — Patient Instructions (Signed)
Thank you for coming in today.  Get xray today and MRI soon.  Let me know if you dont hear about MRI scheduling.  Recheck after MRI. Ok to work if able.   Use a hinged knee brace.   Ice, meloxicam, tylneol.

## 2020-02-25 NOTE — Progress Notes (Signed)
Left knee x-ray shows swelling

## 2020-02-29 ENCOUNTER — Ambulatory Visit (INDEPENDENT_AMBULATORY_CARE_PROVIDER_SITE_OTHER): Payer: 59

## 2020-02-29 ENCOUNTER — Other Ambulatory Visit: Payer: Self-pay

## 2020-02-29 DIAGNOSIS — M25562 Pain in left knee: Secondary | ICD-10-CM

## 2020-03-02 NOTE — Progress Notes (Signed)
MRI knee shows complete tear of the ACL.Small tear of the medial meniscus also present with sprains of the medial and lateral collateral ligaments without tear. Bones are bruised as well resulting from the injury that tore the ACL.Recommend return to clinic so we can discuss the findings in person in further detail.  However could just refer to surgery if you would like. This likely will require surgery.  If you would like me to put a surgery referral in now let me know if you have any existing relationship with an orthopedic surgeon.  I have some good recommendations otherwise.

## 2020-03-03 ENCOUNTER — Telehealth: Payer: Self-pay | Admitting: Family Medicine

## 2020-03-03 DIAGNOSIS — S83512A Sprain of anterior cruciate ligament of left knee, initial encounter: Secondary | ICD-10-CM

## 2020-03-03 NOTE — Telephone Encounter (Signed)
Referred to [facility] care formally [facility] orthopedics in [place].  You should hear from their office soon.

## 2020-03-03 NOTE — Telephone Encounter (Signed)
-----   Message from Christoper Fabian, LAT sent at 03/02/2020  1:28 PM EDT ----- Called pt and she had seen her L knee MRI results through MyChart.  Pt states that she would prefer to be referred directly to orthopedics and does not have a preference of where she goes.  Please place referral.

## 2020-03-05 ENCOUNTER — Ambulatory Visit: Payer: 59 | Admitting: Surgical

## 2020-03-06 ENCOUNTER — Ambulatory Visit: Payer: 59 | Admitting: Surgical

## 2020-03-09 ENCOUNTER — Ambulatory Visit: Payer: 59 | Admitting: Surgical

## 2020-03-20 ENCOUNTER — Encounter: Payer: Self-pay | Admitting: Family Medicine

## 2020-03-20 ENCOUNTER — Other Ambulatory Visit: Payer: Self-pay

## 2020-03-20 ENCOUNTER — Ambulatory Visit: Payer: 59 | Admitting: Orthopedic Surgery

## 2020-03-20 ENCOUNTER — Encounter: Payer: Self-pay | Admitting: Orthopedic Surgery

## 2020-03-20 DIAGNOSIS — S83512A Sprain of anterior cruciate ligament of left knee, initial encounter: Secondary | ICD-10-CM

## 2020-03-20 DIAGNOSIS — G5601 Carpal tunnel syndrome, right upper limb: Secondary | ICD-10-CM | POA: Diagnosis not present

## 2020-03-20 NOTE — Progress Notes (Signed)
Office Visit Note   Patient: Danielle Cook           Date of Birth: 12-Aug-1976           MRN: [id number] Visit Date: 03/20/2020 Requested by: Rodolph Bong, MD [address] [place],  [place] [address] PCP: Etta Grandchild, MD  Subjective: Chief Complaint  Patient presents with  . Left Knee - Pain    HPI: Danielle Cook is a 44 year old patient with left knee pain as well as right hand numbness.  She has a known diagnosis of right carpal tunnel syndrome.  Nerve conduction study has not been done yet.  She injured her left knee a month ago while jumping on a trampoline.  She has been ambulating with a brace.  Experienced pain and swelling at the time but has not had any symptomatic instability since ambulating with the brace.  She does report some popping but no locking.  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  No prior injuries or surgery with the left knee.  No personal or family history of DVT or pulmonary embolism.  She does like to walk for exercise.  She likes to garden.  She is currently between contracts as a travel Engineer, civil (consulting).  She was recently separated and has an 60-year-old at home..  Patient also describes right hand numbness and tingling on the palmar aspect with nonsustained relief from 2 prior injections into the carpal tunnel.  She is taking Neurontin for her wrist symptoms.  Outside MRI scan of the left knee is reviewed with the patient.  Does show ACL tear as well as vertical tear through the posterior horn of the medial meniscus.  Not much in terms of arthritis in the knee.              ROS: All systems reviewed are negative as they relate to the chief complaint within the history of present illness.  Patient denies  fevers or chills.   Assessment & Plan: Visit Diagnoses:  1. Right carpal tunnel syndrome   2. Rupture of anterior cruciate ligament of left knee, initial encounter     Plan: Impression is left knee ACL tear with medial meniscal tear.  She does  not do too much in terms of cutting and pivoting exercise but she is active outdoors.  She also has an 78-year-old son with whom she would like to remain active.  Social situation is mildly complicated at this time in terms of social support network.  Patient also desires not to be out of work for 2 separate surgeries.  Because of potential weightbearing requirements through that right hand especially with the meniscal repair her situation is a little complicated.  Nonetheless with her meniscal tear and ACL insufficiency I think she is at high risk for worsening instability and further damage of the chondral surface in that knee.  Plan at this time is prerehabilitation exercises in anticipation of later ACL reconstruction.  I think wearing the brace and primarily doing noncutting and pivoting outside activities she should be okay to delay reconstruction.  Would also like to get nerve study on that right wrist to evaluate for carpal tunnel syndrome severity.  I think she is heading for surgery on the wrist based on her clinical description.  We could rehabilitate her with a rolling walker which would not require her to put direct pressure through the hand.  We will see her back in 6 weeks for clinical  recheck.  Follow-Up Instructions: Return in about 6 weeks (around 05/01/2020).   Orders:  Orders Placed This Encounter  Procedures  . Ambulatory referral to Physical Therapy  . Ambulatory referral to Physical Medicine Rehab   No orders of the defined types were placed in this encounter.     Procedures: No procedures performed   Clinical Data: No additional findings.  Objective: Vital Signs: There were no vitals taken for this visit.  Physical Exam:   Constitutional: Patient appears well-developed HEENT:  Head: Normocephalic Eyes:EOM are normal Neck: Normal range of motion Cardiovascular: Normal rate Pulmonary/chest: Effort normal Neurologic: Patient is alert Skin: Skin is  warm Psychiatric: Patient has normal mood and affect    Ortho Exam: Ortho exam demonstrates 5 out of 5 grip EPL FPL interosseous wrist flexion extension strength.  On the right-hand side negative Tinel's cubital tunnel.  Some pain with palpation of that carpal tunnel.  Grip strength is intact.  Wrist range of motion is full.  Slight flattening but no significant wasting of the abductor pollicis brevis on the right.  Examination of the left knee demonstrates mild effusion with full extension on the left and 10 degrees of hyperextension on the right.  Collaterals are stable.  ACL laxity is present.  No posterior lateral rotatory instability is noted.  Pedal pulses palpable.  No patellar or quad tendon tenderness.  Mild medial and lateral joint line tenderness is present.  Pedal pulses palpable.  Range of motion is easily to 120 of flexion.  Quad strength is good.  No calf tenderness negative Homans today on the left.  Specialty Comments:  No specialty comments available.  Imaging: No results found.   PMFS History: Patient Active Problem List   Diagnosis Date Noted  . Right carpal tunnel syndrome 12/06/2019  . Xerosis cutis 10/16/2019  . Greater trochanteric bursitis of both hips 08/09/2018  . Nicotine vapor product user 08/09/2018  . Erythrocytosis 08/09/2018  . Greater trochanteric bursitis of right hip 02/06/2017  . Chronic right SI joint pain 02/06/2017  . Visit for screening mammogram 12/13/2016  . Family history of premature CAD 12/13/2016  . Radiculitis of left cervical region 03/23/2016  . 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 Routine general medical examination at a health care facility 03/22/2013  . Allergic rhinitis, cause unspecified 03/22/2013  . Depression with anxiety 10/31/2011   Past Medical History:  Diagnosis Date  . Abnormal Pap smear   . Allergy   . History of chicken pox   . 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 Hyperlipidemia   . Migraine   . PPD positive, treated 2004     Family History  Problem Relation Age of Onset  . Stroke Mother   . Dementia Mother   . Hyperlipidemia Mother   . Early death Father   . Heart disease Father   . Hyperlipidemia Father   . Cancer Neg Hx   . Depression Neg Hx   . Diabetes Neg Hx   . Drug abuse Neg Hx   . Hearing loss Neg Hx   . Hypertension Neg Hx     Past Surgical History:  Procedure Laterality Date  . CHOLECYSTECTOMY, LAPAROSCOPIC  07/13/2000  . COLPOSCOPY  2000,   . TONSILLECTOMY    . [place] History   Occupational History  . Not on file  Tobacco Use  . Smoking status: Current Every Day Smoker    Packs/day: 0.25    Years:  15.00    Pack years: 3.75    Types: E-cigarettes  . Smokeless tobacco: Never Used  Substance and Sexual Activity  . Alcohol use: No    Comment: few glasses of wine during pregnancy  . Drug use: No  . Sexual activity: Yes    Partners: Male

## 2020-04-03 ENCOUNTER — Ambulatory Visit (INDEPENDENT_AMBULATORY_CARE_PROVIDER_SITE_OTHER): Payer: 59 | Admitting: Physical Therapy

## 2020-04-03 ENCOUNTER — Other Ambulatory Visit: Payer: Self-pay

## 2020-04-03 ENCOUNTER — Encounter: Payer: Self-pay | Admitting: Physical Therapy

## 2020-04-03 DIAGNOSIS — G8929 Other chronic pain: Secondary | ICD-10-CM

## 2020-04-03 DIAGNOSIS — M6281 Muscle weakness (generalized): Secondary | ICD-10-CM | POA: Diagnosis not present

## 2020-04-03 DIAGNOSIS — R2689 Other abnormalities of gait and mobility: Secondary | ICD-10-CM | POA: Diagnosis not present

## 2020-04-03 DIAGNOSIS — M25562 Pain in left knee: Secondary | ICD-10-CM | POA: Diagnosis not present

## 2020-04-03 DIAGNOSIS — R600 Localized edema: Secondary | ICD-10-CM | POA: Diagnosis not present

## 2020-04-03 NOTE — Addendum Note (Signed)
Addended by: Milford Cage on: 04/03/2020 02:21 PM   Modules accepted: Orders

## 2020-04-03 NOTE — Therapy (Signed)
[facility] [place], [place], [address] Phone: [phone]   Fax:  [phone]  Physical Therapy Evaluation  Patient Details  Name: Danielle Cook MRN: [id number] Date of Birth: 11/29/1975 Referring Provider (PT): Marlou Sa Tonna Corner, MD   Encounter Date: 04/03/2020  PT End of Session - 04/03/20 1403    Visit Number  1    Number of Visits  13    Date for PT Re-Evaluation  05/15/20    PT Start Time  1300    PT Stop Time  1348    PT Time Calculation (min)  48 min    Activity Tolerance  Patient tolerated treatment well    Behavior During Therapy  [facility] for tasks assessed/performed       Past Medical History:  Diagnosis Date  . Abnormal Pap smear   . Allergy   . History of chicken pox   . 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 Hyperlipidemia   . Migraine   . PPD positive, treated 2004    Past Surgical History:  Procedure Laterality Date  . CHOLECYSTECTOMY, LAPAROSCOPIC  07/13/2000  . COLPOSCOPY  2000,   . TONSILLECTOMY    . [place]    There were no vitals filed for this visit.   Subjective Assessment - 04/03/20 1307    Subjective  pt is a 44 y.o F with CC L knee pain which occured when she was at the trampoline park and tried jumping from one to another that caused her knee to dropped in and it popped back in April of 2021. She noted feeling unstable, she reports limited to no pain just difficulty bearing weight. She currently using a hinged knee brace she reports it helps.    Limitations  Standing;Lifting;Walking    How long can you sit comfortably?  unlimited    How long can you stand comfortably?  unlimited with brace on    How long can you walk comfortably?  unlimited with brace on    Diagnostic tests  02/29/2020 IMPRESSION:1. Complete tear of the anterior cruciate ligament.2. Small longitudinal tear of the undersurface of the periphery ofthe posterior horn of the medial meniscus.3. Slight sprains of the  femoral attachments of the medial andlateral collateral ligaments.4. Prominent joint effusion.5. 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    Patient Stated Goals  to get the muscle stronger,    Currently in Pain?  Yes    Pain Score  0-No pain   at worst 4-5/10   Pain Location  Knee    Pain Orientation  Left    Pain Descriptors / Indicators  Aching    Pain Type  Chronic pain    Pain Onset  More than a month ago    Pain Frequency  Constant    Aggravating Factors   standing/ walking without the brace, stairs    Pain Relieving Factors  medication, sit and rest    Effect of Pain on Daily Activities  limited endurance secodnary to instability         [facility] PT Assessment - 04/03/20 1318      Assessment   Medical Diagnosis   Rupture of anterior cruciate ligament of left knee, initial encounter F57.322G    Referring Provider (PT)  Meredith Pel, MD    Onset Date/Surgical Date  --   April 2021   Hand Dominance  Right    Next MD Visit  06/01/2020    Prior  Therapy  no      Precautions   Precautions  None      Restrictions   Weight Bearing Restrictions  No      Balance Screen   Has the patient fallen in the past 6 months  Yes    How many times?  1    Has the patient had a decrease in activity level because of a fear of falling?   No    Is the patient reluctant to leave their home because of a fear of falling?   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, carrying, pushing and pulling      Cognition   Overall Cognitive Status  Within  Functional Limits for tasks assessed      ROM / Strength   AROM / PROM / Strength  AROM;Strength;PROM      AROM   AROM Assessment Site  Knee    Right/Left Knee  Left;Right    Right Knee Extension  --   +5 hyper extension   Right Knee Flexion  127    Left Knee Extension  4    Left Knee Flexion  126      PROM   PROM Assessment Site  Knee    Right/Left Knee  Left    Left Knee Flexion  0      Strength   Strength Assessment Site  Hip;Knee    Right/Left Hip  Right;Left    Right Hip Flexion  4-/5    Right Hip Extension  4-/5    Right Hip ABduction  4/5    Left Hip Extension  4-/5    Left Hip ABduction  3+/5    Right/Left Knee  Right;Left    Right Knee Flexion  5/5    Right Knee Extension  5/5    Left Knee Flexion  4/5    Left Knee Extension  4-/5      Palpation   Palpation comment  TTP noted along the medial/ lateral joint space, along the MCL, and quad and patellar tendon      Ambulation/Gait   Ambulation/Gait  Yes    Gait Pattern  Step-through pattern;Decreased stance time - left;Decreased step length - right;Decreased trunk rotation;Antalgic    Gait Comments  current utilizing hinged knee brace on the LLE      Balance   Balance Assessed  Yes      Static Standing Balance   Static Standing - Balance Support  No upper extremity supported    Static Standing Balance -  Activities   Single Leg Stance - Left Leg   x 10 seconds max                  Objective measurements completed on examination: See above findings.      [facility] Adult PT Treatment/Exercise - 04/03/20 1318  Exercises   Exercises  Knee/Hip      Knee/Hip Exercises: Seated   Sit to Sand  1 set;10 reps   increased L knee pain, halted after 1 set     Knee/Hip Exercises: Supine   Quad Sets  Strengthening;Left;1 set;10 reps    Quad Sets Limitations  with towel in popliteal space to cue quad activation    Bridges  1 set;10 reps;Both;Strengthening             PT Education - 04/03/20  1405    Education Details  evaluation findings, POC, goals. HEP with proper form/ rationale. Anatomy of the area involved. time spent addressing pt questions.    Person(s) Educated  Patient    Methods  Explanation;Verbal cues;Handout    Comprehension  Verbalized understanding;Verbal cues required       PT Short Term Goals - 04/03/20 1412      PT SHORT TERM GOAL #1   Title  pt to be I with inital HEP    Time  3    Period  Weeks    Status  New    Target Date  04/24/20        PT Long Term Goals - 04/03/20 1412      PT LONG TERM GOAL #1   Title  increase Gross LLE strength to >/= 4+/5 to promote knee stability with walking/ standing    Time  6    Period  Weeks    Status  New    Target Date  05/15/20      PT LONG TERM GOAL #2   Title  pt to be able to walk / stand with no limtation without a brace for stability for work related task and ADls    Time  6    Period  Weeks    Status  New    Target Date  05/15/20      PT LONG TERM GOAL #3   Title  pt to be able to perform SLS balance for >/= 30 sec with no report of instability to promote stability    Time  6    Period  Weeks    Status  New    Target Date  05/15/20      PT LONG TERM GOAL #4   Title  pt to be I with all HEP given to maintain and progress current level of function    Time  6    Period  Weeks    Status  New    Target Date  05/15/20             Plan - 04/03/20 1406    Clinical Impression Statement  pt present to OPPT with dx of L knee ACL tear which occured at a trampoline park in April of 2021. She plans to undergo ACL reconstruction in the next few months, as well as carpal tunnel release. She has functional Knee ROM with gross LLE weakness noted compared bil. TTP noted along the medial/ lateral joint space, along the MCL, and quad and patellar tendon. She would benefit from physical therapy to decrease L knee pain, improve hip/knee strength, maximize stability and function by addressing the deficits  listed.    Stability/Clinical Decision Making  Stable/Uncomplicated    Clinical Decision Making  Low    Rehab Potential  Good    PT Frequency  2x / week    PT Duration  6 weeks    PT Treatment/Interventions  ADLs/Self Care  Home Management;Cryotherapy;Electrical Stimulation;Iontophoresis 4mg /ml Dexamethasone;Moist Heat;Ultrasound;Therapeutic activities;Therapeutic exercise;Balance training;Neuromuscular re-education;Patient/family education;Manual techniques;Dry needling;Taping;Vasopneumatic Device;Passive range of motion;Stair training;Gait training    PT Next Visit Plan  review/ update HEP PRN, gross LLE strengtheing, gait/ stair training, balance training,    PT Home Exercise Plan  [id number] - SLR, quad set, bridge, sidelying hip abduction, heel raises    Consulted and Agree with Plan of Care  Patient       Patient will benefit from skilled therapeutic intervention in order to improve the following deficits and impairments:  Abnormal gait, Improper body mechanics, Increased muscle spasms, Decreased strength, Pain, Decreased activity tolerance, Decreased balance, Decreased endurance  Visit Diagnosis: Chronic pain of left knee  Muscle weakness (generalized)  Other abnormalities of gait and mobility  Localized edema     Problem List Patient Active Problem List   Diagnosis Date Noted  . Right carpal tunnel syndrome 12/06/2019  . Xerosis cutis 10/16/2019  . Greater trochanteric bursitis of both hips 08/09/2018  . Nicotine vapor product user 08/09/2018  . Erythrocytosis 08/09/2018  . Greater trochanteric bursitis of right hip 02/06/2017  . Chronic right SI joint pain 02/06/2017  . Visit for screening mammogram 12/13/2016  . Family history of premature CAD 12/13/2016  . Radiculitis of left cervical region 03/23/2016  . 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 Routine general medical examination at a health care facility 03/22/2013  . Allergic rhinitis, cause unspecified 03/22/2013  .  Depression with anxiety 10/31/2011   11/02/2011 PT, DPT, LAT, ATC  04/03/20  2:19 PM      [facility] Physical Therapy [address] [place], [place], [place] Phone: [phone]   Fax:  [phone]  Name: Danielle Cook MRN: [id number] Ridgel Date of Birth: August 11, 1976

## 2020-04-08 ENCOUNTER — Encounter: Payer: 59 | Admitting: Physical Therapy

## 2020-04-08 ENCOUNTER — Telehealth: Payer: Self-pay | Admitting: Physical Therapy

## 2020-04-08 NOTE — Telephone Encounter (Signed)
Pt no show for PT appointment today. They were contacted and informed of this via voicemail. They were provided the date and time of their next appointment on voicemail. They were instructed to call us to let us know if they cannot make their appointment.  Ivery Quale, PT, DPT 04/08/20 10:21 AM

## 2020-04-10 ENCOUNTER — Encounter: Payer: 59 | Admitting: Physical Medicine & Rehabilitation

## 2020-04-12 ENCOUNTER — Other Ambulatory Visit: Payer: Self-pay | Admitting: Family Medicine

## 2020-04-17 ENCOUNTER — Encounter: Payer: Self-pay | Admitting: Physical Therapy

## 2020-04-17 ENCOUNTER — Other Ambulatory Visit: Payer: Self-pay

## 2020-04-17 ENCOUNTER — Ambulatory Visit: Payer: 59 | Admitting: Physical Therapy

## 2020-04-17 DIAGNOSIS — M6281 Muscle weakness (generalized): Secondary | ICD-10-CM | POA: Diagnosis not present

## 2020-04-17 DIAGNOSIS — R2689 Other abnormalities of gait and mobility: Secondary | ICD-10-CM

## 2020-04-17 DIAGNOSIS — M25562 Pain in left knee: Secondary | ICD-10-CM | POA: Diagnosis not present

## 2020-04-17 DIAGNOSIS — R600 Localized edema: Secondary | ICD-10-CM | POA: Diagnosis not present

## 2020-04-17 DIAGNOSIS — G8929 Other chronic pain: Secondary | ICD-10-CM

## 2020-04-17 NOTE — Therapy (Signed)
[facility] Physical Therapy [address] [place], [place], [address] Phone: [phone]   Fax:  [phone]  Physical Therapy Treatment  Patient Details  Name: Danielle Cook MRN: [id number] Date of Birth: 01/28/1976 Referring Provider (PT): August Saucer Corrie Mckusick, MD   Encounter Date: 04/17/2020   PT End of Session - 04/17/20 1253    Visit Number 2    Number of Visits 13    Date for PT Re-Evaluation 05/15/20    PT Start Time 1253    PT Stop Time 1341    PT Time Calculation (min) 48 min    Activity Tolerance Patient tolerated treatment well    Behavior During Therapy [facility] for tasks assessed/performed           Past Medical History:  Diagnosis Date  . Abnormal Pap smear   . Allergy   . History of chicken pox   . 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 Hyperlipidemia   . Migraine   . PPD positive, treated 2004    Past Surgical History:  Procedure Laterality Date  . CHOLECYSTECTOMY, LAPAROSCOPIC  07/13/2000  . COLPOSCOPY  2000,   . TONSILLECTOMY    . WISDOM TOOTH EXTRACTION  1995 Jan    There were no vitals filed for this visit.   Subjective Assessment - 04/17/20 1253    Subjective " I missed the last appointment and I had issues with my schedule. Still wearing the brace at work and in the yard. At home I am not wearing the brace"    Diagnostic tests 02/29/2020 IMPRESSION:1. Complete tear of the anterior cruciate ligament.2. Small longitudinal tear of the undersurface of the periphery ofthe posterior horn of the medial meniscus.3. Slight sprains of the femoral attachments of the medial andlateral collateral ligaments.4. Prominent joint effusion.5. 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    Currently in Pain? No/denies                             [facility] Adult PT Treatment/Exercise - 04/17/20 0001      Knee/Hip Exercises: Aerobic   Nustep L5 x 5 min LE only      Knee/Hip Exercises: Machines for Strengthening   Other Machine  shuttle LLE only 1 x 15 75#, 1 x 15 @ 100#      Knee/Hip Exercises: Seated   Sit to Sand 10 reps;3 sets   1 set bil, 1 x 10 with RLE advanced forward,      Knee/Hip Exercises: Supine   Bridges 2 sets;15 reps   4# weight     Knee/Hip Exercises: Sidelying   Hip ABduction 2 sets;15 reps;Strengthening;Left   4#              Balance Exercises - 04/17/20 0001      Balance Exercises: Standing   Standing Eyes Opened Narrow base of support (BOS);1 rep;30 secs    Standing Eyes Closed Narrow base of support (BOS);1 rep;30 secs    Tandem Stance Eyes open;Eyes closed;4 reps;30 secs   alternating lead leg   SLS Eyes open;3 reps;30 secs             PT Education - 04/17/20 1346    Education Details reviewd HEP    Person(s) Educated Patient    Methods Explanation;Verbal cues    Comprehension Verbalized understanding;Verbal cues required            PT Short Term Goals - 04/03/20 1412  PT SHORT TERM GOAL #1   Title pt to be I with inital HEP    Time 3    Period Weeks    Status New    Target Date 04/24/20             PT Long Term Goals - 04/03/20 1412      PT LONG TERM GOAL #1   Title increase Gross LLE strength to >/= 4+/5 to promote knee stability with walking/ standing    Time 6    Period Weeks    Status New    Target Date 05/15/20      PT LONG TERM GOAL #2   Title pt to be able to walk / stand with no limtation without a brace for stability for work related task and ADls    Time 6    Period Weeks    Status New    Target Date 05/15/20      PT LONG TERM GOAL #3   Title pt to be able to perform SLS balance for >/= 30 sec with no report of instability to promote stability    Time 6    Period Weeks    Status New    Target Date 05/15/20      PT LONG TERM GOAL #4   Title pt to be I with all HEP given to maintain and progress current level of function    Time 6    Period Weeks    Status New    Target Date 05/15/20                 Plan -  04/17/20 1345    Clinical Impression Statement pt reports consistency with her HEP, and denies any pain just notes still being apprehensive of causing more damage. She responded well to hip/ knee strengthening but did fatigue with sidelying hip abduction exercsie and ehibits increased postural sway with wide and narrow BOS balance training.    PT Treatment/Interventions ADLs/Self Care Home Management;Cryotherapy;Electrical Stimulation;Iontophoresis 4mg /ml Dexamethasone;Moist Heat;Ultrasound;Therapeutic activities;Therapeutic exercise;Balance training;Neuromuscular re-education;Patient/family education;Manual techniques;Dry needling;Taping;Vasopneumatic Device;Passive range of motion;Stair training;Gait training    PT Next Visit Plan review/ update HEP PRN, gross LLE strengtheing, gait/ stair training, balance training,    PT Home Exercise Plan [id number] - SLR, quad set, bridge, sidelying hip abduction, heel raises    Consulted and Agree with Plan of Care Patient           Patient will benefit from skilled therapeutic intervention in order to improve the following deficits and impairments:  Abnormal gait, Improper body mechanics, Increased muscle spasms, Decreased strength, Pain, Decreased activity tolerance, Decreased balance, Decreased endurance  Visit Diagnosis: Chronic pain of left knee  Muscle weakness (generalized)  Other abnormalities of gait and mobility  Localized edema     Problem List Patient Active Problem List   Diagnosis Date Noted  . Right carpal tunnel syndrome 12/06/2019  . Xerosis cutis 10/16/2019  . Greater trochanteric bursitis of both hips 08/09/2018  . Nicotine vapor product user 08/09/2018  . Erythrocytosis 08/09/2018  . Greater trochanteric bursitis of right hip 02/06/2017  . Chronic right SI joint pain 02/06/2017  . Visit for screening mammogram 12/13/2016  . Family history of premature CAD 12/13/2016  . Radiculitis of left cervical region 03/23/2016  .  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 Routine general medical examination at a health care facility 03/22/2013  . Allergic rhinitis, cause unspecified 03/22/2013  . Depression with anxiety 10/31/2011    Page Pucciarelli  Ericka Pontiff, DPT, LAT, ATC  04/17/20  1:48 PM      [facility] Physical Therapy [address] [place], [place], [address] Phone: [phone]   Fax:  [phone]  Name: Danielle Cook MRN: [id number] Date of Birth: 1976/08/11

## 2020-04-24 ENCOUNTER — Encounter: Payer: Self-pay | Admitting: Physical Therapy

## 2020-04-24 ENCOUNTER — Ambulatory Visit (INDEPENDENT_AMBULATORY_CARE_PROVIDER_SITE_OTHER): Payer: 59 | Admitting: Physical Therapy

## 2020-04-24 ENCOUNTER — Ambulatory Visit: Payer: 59 | Admitting: Family Medicine

## 2020-04-24 ENCOUNTER — Other Ambulatory Visit: Payer: Self-pay

## 2020-04-24 ENCOUNTER — Encounter: Payer: Self-pay | Admitting: Family Medicine

## 2020-04-24 DIAGNOSIS — S83512A Sprain of anterior cruciate ligament of left knee, initial encounter: Secondary | ICD-10-CM | POA: Diagnosis not present

## 2020-04-24 DIAGNOSIS — G5601 Carpal tunnel syndrome, right upper limb: Secondary | ICD-10-CM | POA: Diagnosis not present

## 2020-04-24 DIAGNOSIS — R600 Localized edema: Secondary | ICD-10-CM

## 2020-04-24 DIAGNOSIS — M25562 Pain in left knee: Secondary | ICD-10-CM

## 2020-04-24 DIAGNOSIS — M6281 Muscle weakness (generalized): Secondary | ICD-10-CM

## 2020-04-24 DIAGNOSIS — R2689 Other abnormalities of gait and mobility: Secondary | ICD-10-CM

## 2020-04-24 DIAGNOSIS — G8929 Other chronic pain: Secondary | ICD-10-CM

## 2020-04-24 NOTE — Assessment & Plan Note (Signed)
Severe but stable at the moment. Patient will likely need surgery on this after the ACL but would not recommend during potentially. Could make rehabilitation more difficult with weightbearing

## 2020-04-24 NOTE — Assessment & Plan Note (Signed)
Patient has effusion of the knee noted as well significant instability. Patient is following up with the orthopedic surgeon next week and my feeling is she will be set up for surgery in the near future. Discussed with patient about continuing icing, topical anti-inflammatories, oral anti-inflammatories and icing regimen. Consider the possibility of aspiration today but I do believe it would increase the instability of the knee.

## 2020-04-24 NOTE — Therapy (Signed)
[facility] [address] [place], [place], [address] Phone: [phone]   Fax:  [phone]  Physical Therapy Treatment  Patient Details  Name: Danielle Cook MRN: [id number] Date of Birth: January 07, 1976 Referring Provider (PT): August Saucer Corrie Mckusick, MD   Encounter Date: 04/24/2020   PT End of Session - 04/24/20 1351    Visit Number 3    Number of Visits 13    Date for PT Re-Evaluation 05/15/20    PT Start Time 1352    PT Stop Time 1433    PT Time Calculation (min) 41 min    Activity Tolerance Patient tolerated treatment well    Behavior During Therapy [facility] for tasks assessed/performed           Past Medical History:  Diagnosis Date  . Abnormal Pap smear   . Allergy   . History of chicken pox   . 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 Hyperlipidemia   . Migraine   . PPD positive, treated 2004    Past Surgical History:  Procedure Laterality Date  . CHOLECYSTECTOMY, LAPAROSCOPIC  07/13/2000  . COLPOSCOPY  2000,   . TONSILLECTOMY    . WISDOM TOOTH EXTRACTION  1995 Jan    There were no vitals filed for this visit.   Subjective Assessment - 04/24/20 1353    Subjective " I am feeling more sore since the last session but its because I did alot of activity over the weekend, and did a few 12 hour shifts. I am having about 8/10 today"    Diagnostic tests 02/29/2020 IMPRESSION:1. Complete tear of the anterior cruciate ligament.2. Small longitudinal tear of the undersurface of the periphery ofthe posterior horn of the medial meniscus.3. Slight sprains of the femoral attachments of the medial andlateral collateral ligaments.4. Prominent joint effusion.5. 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    Currently in Pain? Yes    Pain Score 8     Pain Location Knee    Pain Orientation Left    Pain Descriptors / Indicators Aching    Pain Type Chronic pain    Pain Onset More than a month ago    Pain Frequency Intermittent    Aggravating Factors   standing/ walking    Pain Relieving Factors medication,              [facility] PT Assessment - 04/24/20 0001      Assessment   Medical Diagnosis  Rupture of anterior cruciate ligament of left knee, initial encounter E33.295J    Referring Provider (PT) Cammy Copa, MD    Hand Dominance Right                         [facility] Adult PT Treatment/Exercise - 04/24/20 0001      Knee/Hip Exercises: Stretches   Active Hamstring Stretch 3 reps;Left;30 seconds      Knee/Hip Exercises: Aerobic   Nustep L5 x 5 min LE only      Knee/Hip Exercises: Seated   Long Arc Quad 2 sets;10 reps;Left;Strengthening    Sit to Sand 2 sets;10 reps      Modalities   Modalities Electrical Stimulation;Cryotherapy      Cryotherapy   Number Minutes Cryotherapy 10 Minutes    Cryotherapy Location Knee    Type of Cryotherapy Ice pack      Electrical Stimulation   Electrical Stimulation Location L knee    Electrical Stimulation Action IFC    Electrical  Stimulation Parameters L20 x 10 min     Electrical Stimulation Goals Pain      Manual Therapy   Manual Therapy Soft tissue mobilization    Manual therapy comments MTPR along the hamstrings x 3    Soft tissue mobilization IASTM along the hamstring using biofreeze                    PT Short Term Goals - 04/03/20 1412      PT SHORT TERM GOAL #1   Title pt to be I with inital HEP    Time 3    Period Weeks    Status New    Target Date 04/24/20             PT Long Term Goals - 04/03/20 1412      PT LONG TERM GOAL #1   Title increase Gross LLE strength to >/= 4+/5 to promote knee stability with walking/ standing    Time 6    Period Weeks    Status New    Target Date 05/15/20      PT LONG TERM GOAL #2   Title pt to be able to walk / stand with no limtation without a brace for stability for work related task and ADls    Time 6    Period Weeks    Status New    Target Date 05/15/20      PT LONG TERM GOAL #3    Title pt to be able to perform SLS balance for >/= 30 sec with no report of instability to promote stability    Time 6    Period Weeks    Status New    Target Date 05/15/20      PT LONG TERM GOAL #4   Title pt to be I with all HEP given to maintain and progress current level of function    Time 6    Period Weeks    Status New    Target Date 05/15/20                 Plan - 04/24/20 1431    Clinical Impression Statement pt reports increased pain rated at 8/10 today which she noted doing alot at home and at work. focused session on pain relief using STW along the hamstrings followed with IASTM techniques which she noted relief of pain. utilized ice and E-stim end of session to calm down pain which she noted was 4/10    PT Treatment/Interventions ADLs/Self Care Home Management;Cryotherapy;Electrical Stimulation;Iontophoresis 4mg /ml Dexamethasone;Moist Heat;Ultrasound;Therapeutic activities;Therapeutic exercise;Balance training;Neuromuscular re-education;Patient/family education;Manual techniques;Dry needling;Taping;Vasopneumatic Device;Passive range of motion;Stair training;Gait training    PT Next Visit Plan review/ update HEP PRN, gross LLE strengtheing, gait/ stair training, balance training, modalities PRN    PT Home Exercise Plan [id number] - SLR, quad set, bridge, sidelying hip abduction, heel raises    Consulted and Agree with Plan of Care Patient           Patient will benefit from skilled therapeutic intervention in order to improve the following deficits and impairments:  Abnormal gait, Improper body mechanics, Increased muscle spasms, Decreased strength, Pain, Decreased activity tolerance, Decreased balance, Decreased endurance  Visit Diagnosis: Chronic pain of left knee  Muscle weakness (generalized)  Other abnormalities of gait and mobility  Localized edema     Problem List Patient Active Problem List   Diagnosis Date Noted  . 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 Right carpal  tunnel syndrome 12/06/2019  . Xerosis cutis 10/16/2019  . Greater trochanteric bursitis of both hips 08/09/2018  . Nicotine vapor product user 08/09/2018  . Erythrocytosis 08/09/2018  . Greater trochanteric bursitis of right hip 02/06/2017  . Chronic right SI joint pain 02/06/2017  . Visit for screening mammogram 12/13/2016  . Family history of premature CAD 12/13/2016  . Radiculitis of left cervical region 03/23/2016  . 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 Routine general medical examination at a health care facility 03/22/2013  . Allergic rhinitis, cause unspecified 03/22/2013  . Depression with anxiety 10/31/2011   Lulu Riding PT, DPT, LAT, ATC  04/24/20  4:02 PM       [facility] Physical Therapy [address] [place], [place], [address] Phone: [phone]   Fax:  [phone]  Name: Danielle Cook MRN: [id number] Date of Birth: Jun 25, 1976

## 2020-04-24 NOTE — Progress Notes (Signed)
[name] [address] [place] [address] Phone: [phone] Subjective:   I Danielle Cook am serving as a Neurosurgeon for Dr. Antoine Primas.  This visit occurred during the SARS-CoV-2 public health emergency.  Safety protocols were in place, including screening questions prior to the visit, additional usage of staff PPE, and extensive cleaning of exam room while observing appropriate contact time as indicated for disinfecting solutions.   I'm seeing this patient by the request  of:  Etta Grandchild, MD  CC: Knee pain, wrist pain follow-up  TDV:[id number]   02/14/2020 Repeat injection given today.  Patient is in diameter out of her median nerve is significantly larger than average at 1.7 cm.  Which would include patient has severe carpal tunnel.  Patient symptoms to correspond with this.  Discussed home exercises, icing regimen, which activities to doing which wants to avoid.  Patient is to increase activity slowly over the course of the next several weeks.  Patient is following up with a hand surgeon in the near future.  Chronic problem, repeat injection given.  Discussed home exercises and icing regimen, which activities do which wants to avoid.  Increase activity slowly over the course the next several weeks.  Patient could have more of a radicular symptoms and may need further work-up.  Did refill gabapentin.  Follow-up again in 4 to 8 weeks.  04/24/2020 Danielle Cook is a 44 y.o. female coming in with complaint of right wrist and left knee pain. Wrist is doing well. Saw by Dr. Denyse Amass for the knee. Knee is most painful. Wearing brace. States she believes the brace does not fit.   Wrist pain patient does have carpal tunnel. Considering the possibility of surgery but now within the knee that seems to be more pressing. Has been doing rehabilitation likely before surgery. Wondering when she will have this done. Following up with orthopedics next week      Past Medical History:  Diagnosis Date  . Abnormal Pap smear   . Allergy   . History of chicken pox   . 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 Hyperlipidemia   . Migraine   . PPD positive, treated 2004   Past Surgical History:  Procedure Laterality Date  . CHOLECYSTECTOMY, LAPAROSCOPIC  07/13/2000  . COLPOSCOPY  2000,   . TONSILLECTOMY    . WISDOM TOOTH EXTRACTION  1995 Jan   Social History   Socioeconomic History  . Marital status: Married    Spouse name: Not on file  . Number of children: Not on file  . Years of education: Not on file  . Highest education level: Not on file  Occupational History  . Not on file  Tobacco Use  . Smoking status: Current Every Day Smoker    Packs/day: 0.25    Years: 15.00    Pack years: 3.75    Types: E-cigarettes  . Smokeless tobacco: Never Used  Substance and Sexual Activity  . Alcohol use: No    Comment: few glasses of wine during pregnancy  . Drug use: No  . Sexual activity: Yes    Partners: Male  Other Topics Concern  . Not on file  Social History Narrative  . Not on file   Social Determinants of Health   Financial Resource Strain:   . Difficulty of Paying Living Expenses:   Food Insecurity:   . Worried About Programme researcher, broadcasting/film/video in the Last Year:   . [employer] of  Food in the Last Year:   Transportation Needs:   . Freight forwarder (Medical):   Marland Kitchen Lack of Transportation (Non-Medical):   Physical Activity:   . Days of Exercise per Week:   . Minutes of Exercise per Session:   Stress:   . Feeling of Stress :   Social Connections:   . Frequency of Communication with Friends and Family:   . Frequency of Social Gatherings with Friends and Family:   . Attends Religious Services:   . Active Member of Clubs or Organizations:   . Attends Banker Meetings:   Marland Kitchen Marital Status:    Allergies  Allergen Reactions  . Eggs Or Egg-Derived Products   . Guaifenesin Er Anxiety and Palpitations  . Latex Hives and Rash  .  Wellbutrin [Bupropion Hcl] Anxiety and Palpitations   Family History  Problem Relation Age of Onset  . Stroke Mother   . Dementia Mother   . Hyperlipidemia Mother   . Early death Father   . Heart disease Father   . Hyperlipidemia Father   . Cancer Neg Hx   . Depression Neg Hx   . Diabetes Neg Hx   . Drug abuse Neg Hx   . Hearing loss Neg Hx   . Hypertension Neg Hx     Current Outpatient Medications (Endocrine & Metabolic):  .  levonorgestrel (MIRENA, 52 MG,) 20 MCG/24HR IUD, 1 Intra Uterine Device (1 each total) by Intrauterine route once.   Current Outpatient Medications (Respiratory):  .  loratadine (CLARITIN) 10 MG tablet, Take 10 mg by mouth every morning.    Current Outpatient Medications (Analgesics):  .  meloxicam (MOBIC) 15 MG tablet, TAKE 1 TABLET BY MOUTH EVERY DAY   Current Outpatient Medications (Other):  .  augmented betamethasone dipropionate (DIPROLENE AF) 0.05 % cream, Apply topically 2 (two) times daily. Marland Kitchen  escitalopram (LEXAPRO) 20 MG tablet, TAKE 1 TABLET BY MOUTH EVERY DAY .  gabapentin (NEURONTIN) 100 MG capsule, Take 2 capsules (200 mg total) by mouth at bedtime. .  gabapentin (NEURONTIN) 100 MG capsule, Take 2 capsules (200 mg total) by mouth at bedtime. .  Vitamin D, Ergocalciferol, (DRISDOL) 1.25 MG (50000 UNIT) CAPS capsule, TAKE 1 CAPSULE BY MOUTH EVERY 7 DAYS   Reviewed prior external information including notes and imaging from  primary care provider As well as notes that were available from care everywhere and other healthcare systems.  Past medical history, social, surgical and family history all reviewed in electronic medical record.  No pertanent information unless stated regarding to the chief complaint.   Review of Systems:  No headache, visual changes, nausea, vomiting, diarrhea, constipation, dizziness, abdominal pain, skin rash, fevers, chills, night sweats, weight loss, swollen lymph nodes, body aches, joint swelling, chest pain,  shortness of breath, mood changes. POSITIVE muscle aches  Objective  Blood pressure 140/90, pulse 64, height 5\' 4"  (1.626 m), weight 228 lb (103.4 kg), SpO2 98 %.   General: No apparent distress alert and oriented x3 mood and affect normal, dressed appropriately.  HEENT: Pupils equal, extraocular movements intact  Respiratory: Patient's speak in full sentences and does not appear short of breath  Cardiovascular: No lower extremity edema, non tender, no erythema  Neuro: Cranial nerves II through XII are intact, neurovascularly intact in all extremities with 2+ DTRs and 2+ pulses.  Gait antalgic Left knee exam does show an effusion noted. Patient lacks the last 15 degrees of flexion. No significant instability noted with anterior drawer's test. Tender  to palpation diffusely.   Impression and Recommendations:     The above documentation has been reviewed and is accurate and complete Lyndal Pulley, DO       Note: This dictation was prepared with Dragon dictation along with smaller phrase technology. Any transcriptional errors that result from this process are unintentional.

## 2020-04-24 NOTE — Patient Instructions (Signed)
Good to see you Listen to Dr. August Saucer  pennsaid 2 times a day if not 4 times daily.  Compression sleeve and brace. Ice the heck out of it. You will do great.

## 2020-05-01 ENCOUNTER — Encounter: Payer: Self-pay | Admitting: Orthopedic Surgery

## 2020-05-01 ENCOUNTER — Other Ambulatory Visit: Payer: Self-pay

## 2020-05-01 ENCOUNTER — Ambulatory Visit: Payer: 59 | Admitting: Physical Therapy

## 2020-05-01 ENCOUNTER — Encounter: Payer: Self-pay | Admitting: Physical Therapy

## 2020-05-01 ENCOUNTER — Ambulatory Visit: Payer: 59 | Admitting: Orthopedic Surgery

## 2020-05-01 VITALS — Ht 64.0 in | Wt 228.0 lb

## 2020-05-01 DIAGNOSIS — M25562 Pain in left knee: Secondary | ICD-10-CM

## 2020-05-01 DIAGNOSIS — G5601 Carpal tunnel syndrome, right upper limb: Secondary | ICD-10-CM

## 2020-05-01 DIAGNOSIS — R600 Localized edema: Secondary | ICD-10-CM

## 2020-05-01 DIAGNOSIS — S83512A Sprain of anterior cruciate ligament of left knee, initial encounter: Secondary | ICD-10-CM | POA: Diagnosis not present

## 2020-05-01 DIAGNOSIS — M6281 Muscle weakness (generalized): Secondary | ICD-10-CM

## 2020-05-01 DIAGNOSIS — R2689 Other abnormalities of gait and mobility: Secondary | ICD-10-CM | POA: Diagnosis not present

## 2020-05-01 DIAGNOSIS — G8929 Other chronic pain: Secondary | ICD-10-CM

## 2020-05-01 NOTE — Therapy (Signed)
[facility] Therapy [address] [place], [place], [address] Phone: [phone]   Fax:  [phone]  Physical Therapy Treatment  Patient Details  Name: Danielle Cook MRN: [id number] Date of Birth: Oct 11, 1976 Referring Provider (PT): August Saucer Corrie Mckusick, MD   Encounter Date: 05/01/2020   PT End of Session - 05/01/20 1059    Visit Number 4    Number of Visits 13    Date for PT Re-Evaluation 05/15/20    PT Start Time 1020    PT Stop Time 1100    PT Time Calculation (min) 40 min    Activity Tolerance Patient tolerated treatment well    Behavior During Therapy [facility] for tasks assessed/performed           Past Medical History:  Diagnosis Date   Abnormal Pap smear    Allergy    History of chicken pox    Hx of TB skin testing    Hyperlipidemia    Migraine    PPD positive, treated 2004    Past Surgical History:  Procedure Laterality Date   CHOLECYSTECTOMY, LAPAROSCOPIC  07/13/2000   COLPOSCOPY  2000,    TONSILLECTOMY     WISDOM TOOTH EXTRACTION  1995 Jan    There were no vitals filed for this visit.   Subjective Assessment - 05/01/20 1025    Subjective worked 12 hours last night, tired today.  Saw Dr. August Saucer this morning and plan is for surgery - just need to work out schedule.    Diagnostic tests 02/29/2020 IMPRESSION:1. Complete tear of the anterior cruciate ligament.2. Small longitudinal tear of the undersurface of the periphery ofthe posterior horn of the medial meniscus.3. Slight sprains of the femoral attachments of the medial andlateral collateral ligaments.4. Prominent joint effusion.5. 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    Patient Stated Goals to get the muscle stronger,    Currently in Pain? Yes    Pain Score 6     Pain Location Knee    Pain Orientation Left    Pain Descriptors / Indicators Aching    Pain Type Chronic pain    Pain Onset More than a month ago    Pain Frequency Intermittent     Aggravating Factors  standing/walking    Pain Relieving Factors medication                             [facility] Adult PT Treatment/Exercise - 05/01/20 1027      Knee/Hip Exercises: Stretches   Passive Hamstring Stretch Left;3 reps;30 seconds    Passive Hamstring Stretch Limitations supine with strap    Quad Stretch Left;3 reps;30 seconds    Quad Stretch Limitations prone with strap      Knee/Hip Exercises: Aerobic   Recumbent Bike no resistance x 8 min      Knee/Hip Exercises: Sidelying   Hip ABduction 2 sets;Strengthening;Left;10 reps   2#   Hip ADduction 2 sets;10 reps;Left   2#     Knee/Hip Exercises: Prone   Hamstring Curl 10 reps   2#   Hip Extension Left;10 reps   2#                   PT Short Term Goals - 05/01/20 1100      PT SHORT TERM GOAL #1   Title pt to be I with inital HEP    Time 3    Period Weeks    Status Achieved  Target Date 04/24/20             PT Long Term Goals - 04/03/20 1412      PT LONG TERM GOAL #1   Title increase Gross LLE strength to >/= 4+/5 to promote knee stability with walking/ standing    Time 6    Period Weeks    Status New    Target Date 05/15/20      PT LONG TERM GOAL #2   Title pt to be able to walk / stand with no limtation without a brace for stability for work related task and ADls    Time 6    Period Weeks    Status New    Target Date 05/15/20      PT LONG TERM GOAL #3   Title pt to be able to perform SLS balance for >/= 30 sec with no report of instability to promote stability    Time 6    Period Weeks    Status New    Target Date 05/15/20      PT LONG TERM GOAL #4   Title pt to be I with all HEP given to maintain and progress current level of function    Time 6    Period Weeks    Status New    Target Date 05/15/20                 Plan - 05/01/20 1100    Clinical Impression Statement Pt tolerated session well, and saw MD this morning.  Planning for ACL reconstruction.   Will plan to d/c next visit until after surgery.    PT Treatment/Interventions ADLs/Self Care Home Management;Cryotherapy;Electrical Stimulation;Iontophoresis 4mg /ml Dexamethasone;Moist Heat;Ultrasound;Therapeutic activities;Therapeutic exercise;Balance training;Neuromuscular re-education;Patient/family education;Manual techniques;Dry needling;Taping;Vasopneumatic Device;Passive range of motion;Stair training;Gait training    PT Next Visit Plan review/ update HEP PRN, gross LLE strengtheing, gait/ stair training, balance training, modalities PRN, plan for d/c or hold    PT Home Exercise Plan [id number] - SLR, quad set, bridge, sidelying hip abduction, heel raises    Consulted and Agree with Plan of Care Patient           Patient will benefit from skilled therapeutic intervention in order to improve the following deficits and impairments:  Abnormal gait, Improper body mechanics, Increased muscle spasms, Decreased strength, Pain, Decreased activity tolerance, Decreased balance, Decreased endurance  Visit Diagnosis: Chronic pain of left knee  Muscle weakness (generalized)  Other abnormalities of gait and mobility  Localized edema     Problem List Patient Active Problem List   Diagnosis Date Noted   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 04/24/2020   Right carpal tunnel syndrome 12/06/2019   Xerosis cutis 10/16/2019   Greater trochanteric bursitis of both hips 08/09/2018   Nicotine vapor product user 08/09/2018   Erythrocytosis 08/09/2018   Greater trochanteric bursitis of right hip 02/06/2017   Chronic right SI joint pain 02/06/2017   Visit for screening mammogram 12/13/2016   Family history of premature CAD 12/13/2016   Radiculitis of left cervical region 03/23/2016   PPD positive, treated 03/22/2013   Routine general medical examination at a health care facility 03/22/2013   Allergic rhinitis, cause unspecified 03/22/2013   Depression with anxiety 10/31/2011      11/02/2011, PT, DPT 05/01/20 11:01 AM     [facility] Physical Therapy [address] [place], [place], [place] Phone: [phone]   Fax:  [phone]  Name: Danielle Cook MRN: Garrel Ridgel Date  of Birth: 10-Jun-1976

## 2020-05-02 ENCOUNTER — Encounter: Payer: Self-pay | Admitting: Orthopedic Surgery

## 2020-05-02 NOTE — Progress Notes (Signed)
Office Visit Note   Patient: Danielle Cook           Date of Birth: 1976-07-30           MRN: [id number] Visit Date: 05/01/2020 Requested by: Danielle Grandchild, MD [address] [place],  [place] [address] PCP: Danielle Grandchild, MD  Subjective: Chief Complaint  Patient presents with  . Left Knee - Follow-up  . Right Hand - Follow-up    HPI: Danielle Cook is a 44 year old patient with left knee pain and right hand pain.  She has been in therapy for the left knee to work on range of motion.  She has known ACL tear as well as medial meniscal tear.  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  She does wear knee braces.  Takes Mobic which helps.  Patient also describes right carpal tunnel syndrome.  She was not able to make it to the nerve conduction study due to her work schedule.  She has been receiving carpal tunnel injections with relief from her symptoms.  She needs to be recovered from both surgeries within in 8 weeks time.  She needs to be essentially functional to walk around during that time.  Patient also describes having some type of metal allergy.  She is not able to wear any type of jewelry other than pure gold jewelry.  She has however had gallbladder removed which typically involves metal clips and has not had any abdominal pain from that.              ROS: All systems reviewed are negative as they relate to the chief complaint within the history of present illness.  Patient denies  fevers or chills.   Assessment & Plan: Visit Diagnoses:  1. Right carpal tunnel syndrome   2. Rupture of anterior cruciate ligament of left knee, initial encounter     Plan: Impression is right carpal tunnel syndrome and left knee ACL tear and medial meniscal tear.  That meniscal tear may very well require repair which would also necessitate a period of nonweightbearing.  For that reason I would favor delaying the carpal tunnel release until 3 to 4 weeks after her ACL reconstruction when she is  weightbearing.  Risk and benefits are discussed regarding the carpal tunnel surgery.  Regarding the left knee I think our decision here is allograft versus autograft quadriceps tendon graft.  With the autograft quad tendon Endobutton fixation would be required which would put her at a very small risk of initiating reaction in her body.  I did discuss the composite of these very small Endobutton's with the [employer] rep.  In general the amount of nickel present in these titanium implants is approximately 0.05%.  I think that is comparable to her gallbladder metal clips.  Nonetheless using quadriceps allograft we could use screw fixation with no metal which could be preferred.  I will call Danielle Cook and we can talk about the specifics of the graft.  I think for her either graft would be acceptable in terms of restoring her to a functional stable knee.  She would likely have a slightly better and quicker recovery with the allograft as opposed to autograft.  That does seem to be important to her as she is the sole income provider for her family.  Follow-Up Instructions: No follow-ups on file.   Orders:  No orders of the defined types were placed in this encounter.  No orders of the defined types were placed in  this encounter.     Procedures: No procedures performed   Clinical Data: No additional findings.  Objective: Vital Signs: Ht 5\' 4"  (1.626 m)   Wt 228 lb (103.4 kg)   BMI 39.14 kg/m   Physical Exam:   Constitutional: Patient appears well-developed HEENT:  Head: Normocephalic Eyes:EOM are normal Neck: Normal range of motion Cardiovascular: Normal rate Pulmonary/chest: Effort normal Neurologic: Patient is alert Skin: Skin is warm Psychiatric: Patient has normal mood and affect    Ortho Exam: Ortho exam demonstrates ACL laxity on the left knee with positive Lachman and anterior drawer.  Trace effusion present.  She has good range of motion lacking only 1 to 2 degrees of full extension  and she has flexion easily past 90 degrees.  Collateral ligaments are stable to varus valgus stress at 0 and 30 degrees.  No posterior lateral rotatory instability is noted.  Patient does have some medial joint line tenderness.  Examination of the hand demonstrates good grip strength with no abductor pollicis brevis wasting.  The rest of her hand exam is unchanged.  Regarding her carpal tunnel syndrome I think that clinically she does have carpal tunnel syndrome and has  Responded favorably to injections.  I think carpal tunnel release would not be unreasonable at this time based on her clear history of median distribution paresthesias with no dorsal hand numbness and good response to injections. Specialty Comments:  No specialty comments available.  Imaging: No results found.   PMFS History: Patient Active Problem List   Diagnosis Date Noted  . 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 Right carpal tunnel syndrome 12/06/2019  . Xerosis cutis 10/16/2019  . Greater trochanteric bursitis of both hips 08/09/2018  . Nicotine vapor product user 08/09/2018  . Erythrocytosis 08/09/2018  . Greater trochanteric bursitis of right hip 02/06/2017  . Chronic right SI joint pain 02/06/2017  . Visit for screening mammogram 12/13/2016  . Family history of premature CAD 12/13/2016  . Radiculitis of left cervical region 03/23/2016  . 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 Routine general medical examination at a health care facility 03/22/2013  . Allergic rhinitis, cause unspecified 03/22/2013  . Depression with anxiety 10/31/2011   Past Medical History:  Diagnosis Date  . Abnormal Pap smear   . Allergy   . History of chicken pox   . 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 Hyperlipidemia   . Migraine   . PPD positive, treated 2004    Family History  Problem Relation Age of Onset  . Stroke Mother   . Dementia Mother   . Hyperlipidemia Mother   . Early death Father   . Heart disease Father   .  Hyperlipidemia Father   . Cancer Neg Hx   . Depression Neg Hx   . Diabetes Neg Hx   . Drug abuse Neg Hx   . Hearing loss Neg Hx   . Hypertension Neg Hx     Past Surgical History:  Procedure Laterality Date  . CHOLECYSTECTOMY, LAPAROSCOPIC  07/13/2000  . COLPOSCOPY  2000,   . TONSILLECTOMY    . WISDOM TOOTH EXTRACTION  1995 Jan   Social History   Occupational History  . Not on file  Tobacco Use  . Smoking status: Current Every Day Smoker    Packs/day: 0.25    Years: 15.00    Pack years: 3.75    Types: E-cigarettes  . Smokeless tobacco: Never Used  Substance and Sexual Activity  .  Alcohol use: No    Comment: few glasses of wine during pregnancy  . Drug use: No  . Sexual activity: Yes    Partners: Male

## 2020-05-05 ENCOUNTER — Telehealth: Payer: Self-pay | Admitting: Orthopedic Surgery

## 2020-05-05 NOTE — Telephone Encounter (Signed)
Tried calling to discuss. No answer. LM for her to return call.

## 2020-05-06 NOTE — Telephone Encounter (Signed)
Tried calling patient x 2 to advise per your note below. I have not been able to reach patient.

## 2020-05-06 NOTE — Telephone Encounter (Signed)
She works night shifts as he is probably sleeping but if you can put it in my chart that would be good because she looks at it there thanks

## 2020-05-07 NOTE — Telephone Encounter (Signed)
The ACL surgery is arthroscopic.  Graft harvest  would be open if we decide to go with quad tendon autograft with incision just above the patella.  If you do not want to take any chances with metal then going with the quad tendon allograft would be the way to go and it would give you a quicker recovery.  If you think that the metal would be okay because it is almost 99.5% titanium then quad tendon autograft does not carry any risk of tissue reactivity.  Either would be fine and would give you a stable knee.  The allograft would give you slightly quicker recovery.

## 2020-05-08 ENCOUNTER — Other Ambulatory Visit: Payer: Self-pay

## 2020-05-08 ENCOUNTER — Encounter: Payer: Self-pay | Admitting: Physical Therapy

## 2020-05-08 ENCOUNTER — Ambulatory Visit (INDEPENDENT_AMBULATORY_CARE_PROVIDER_SITE_OTHER): Payer: 59 | Admitting: Physical Therapy

## 2020-05-08 DIAGNOSIS — R2689 Other abnormalities of gait and mobility: Secondary | ICD-10-CM | POA: Diagnosis not present

## 2020-05-08 DIAGNOSIS — R600 Localized edema: Secondary | ICD-10-CM | POA: Diagnosis not present

## 2020-05-08 DIAGNOSIS — G8929 Other chronic pain: Secondary | ICD-10-CM

## 2020-05-08 DIAGNOSIS — M25562 Pain in left knee: Secondary | ICD-10-CM | POA: Diagnosis not present

## 2020-05-08 DIAGNOSIS — M6281 Muscle weakness (generalized): Secondary | ICD-10-CM

## 2020-05-08 NOTE — Patient Instructions (Signed)
Access Code: [email or web address] URL: [email or web address] Date: 05/08/2020 Prepared by: Moshe Cipro  Exercises Supine Quad Set - 3 x daily - 7 x weekly - 3 sets - 10 reps - 5 hold SLR - 1 x daily - 7 x weekly - 3 sets - 15 reps - 1 hold Sidelying Hip Abduction - 1 x daily - 7 x weekly - 3 sets - 15 reps Prone Hip Extension - 1 x daily - 7 x weekly - 3 sets - 15 reps Sidelying Hip Adduction - 1 x daily - 7 x weekly - 3 sets - 15 reps Supine Heel Slide with Strap - 1 x daily - 7 x weekly - 3 sets - 10 reps

## 2020-05-08 NOTE — Therapy (Signed)
[facility] [address] [place], [place], [address] Phone: [phone]   Fax:  [phone]  Physical Therapy Treatment  Patient Details  Name: Danielle Cook MRN: [id number] Date of Birth: [birth year] Referring Provider (PT): August Saucer Corrie Mckusick, MD   Encounter Date: 05/08/2020   PT End of Session - 05/08/20 1152    Visit Number 5    PT Start Time 1105    PT Stop Time 1147    PT Time Calculation (min) 42 min    Activity Tolerance Patient tolerated treatment well    Behavior During Therapy [facility] for tasks assessed/performed           Past Medical History:  Diagnosis Date  . Abnormal Pap smear   . Allergy   . History of chicken pox   . 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 Hyperlipidemia   . Migraine   . PPD positive, treated 2004    Past Surgical History:  Procedure Laterality Date  . CHOLECYSTECTOMY, LAPAROSCOPIC  07/13/2000  . COLPOSCOPY  2000,   . TONSILLECTOMY    . WISDOM TOOTH EXTRACTION  1995 Jan    There were no vitals filed for this visit.   Subjective Assessment - 05/08/20 1114    Subjective having increased pain this morning, improving with bike    Diagnostic tests 02/29/2020 IMPRESSION:1. Complete tear of the anterior cruciate ligament.2. Small longitudinal tear of the undersurface of the periphery ofthe posterior horn of the medial meniscus.3. Slight sprains of the femoral attachments of the medial andlateral collateral ligaments.4. Prominent joint effusion.5. 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    Patient Stated Goals to get the muscle stronger,    Currently in Pain? Yes    Pain Score 6    3-4 with biking   Pain Location Knee    Pain Orientation Left    Pain Descriptors / Indicators Aching    Pain Type Acute pain    Pain Onset More than a month ago    Pain Frequency Intermittent    Aggravating Factors  standing/walking    Pain Relieving Factors medication                              [facility] Adult PT Treatment/Exercise - 05/08/20 1115      Self-Care   Self-Care Other Self-Care Comments    Other Self-Care Comments  extended time spent discussing surgery expectations, equipment needed and how to amb with RW NWB in anticipation of meniscus repair.  Pt provided with resources for additional equipment needs, and may benefit from single HHPT visit to assess for additional needs.      Knee/Hip Exercises: Aerobic   Recumbent Bike L1 x 8 min      Knee/Hip Exercises: Supine   Quad Sets Strengthening;Left;1 set;10 reps    Heel Slides AAROM;5 reps;Left    Straight Leg Raises Left;15 reps      Knee/Hip Exercises: Sidelying   Hip ABduction 2 sets;Strengthening;Left;10 reps    Hip ADduction 2 sets;10 reps;Left      Knee/Hip Exercises: Prone   Hip Extension Left;10 reps                  PT Education - 05/08/20 1151    Education Details pre and post op HEP - discussed following MD orders following surgery and exercises may need to be modified.    Person(s) Educated Patient    Methods Explanation;Demonstration;Handout  Comprehension Verbalized understanding;Returned demonstration            PT Short Term Goals - 05/01/20 1100      PT SHORT TERM GOAL #1   Title pt to be I with inital HEP    Time 3    Period Weeks    Status Achieved    Target Date 04/24/20             PT Long Term Goals - 05/08/20 1152      PT LONG TERM GOAL #1   Title increase Gross LLE strength to >/= 4+/5 to promote knee stability with walking/ standing    Time 6    Period Weeks    Status Deferred      PT LONG TERM GOAL #2   Title pt to be able to walk / stand with no limtation without a brace for stability for work related task and ADls    Time 6    Period Weeks    Status Deferred      PT LONG TERM GOAL #3   Title pt to be able to perform SLS balance for >/= 30 sec with no report of instability to promote stability    Time 6    Period Weeks    Status Deferred      PT LONG  TERM GOAL #4   Title pt to be I with all HEP given to maintain and progress current level of function    Time 6    Period Weeks    Status Deferred                 Plan - 05/08/20 1152    Clinical Impression Statement Pt planning surgery so session today focused on surgery expectations and rehab following.  Will cx remaining appts and reeval after surgery.  All goals deferred at this time.    PT Treatment/Interventions ADLs/Self Care Home Management;Cryotherapy;Electrical Stimulation;Iontophoresis 4mg /ml Dexamethasone;Moist Heat;Ultrasound;Therapeutic activities;Therapeutic exercise;Balance training;Neuromuscular re-education;Patient/family education;Manual techniques;Dry needling;Taping;Vasopneumatic Device;Passive range of motion;Stair training;Gait training    PT Next Visit Plan hold PT until after surgery    PT Home Exercise Plan [email or web address] - SLR, quad set, bridge, sidelying hip abduction, heel raises    Consulted and Agree with Plan of Care Patient           Patient will benefit from skilled therapeutic intervention in order to improve the following deficits and impairments:  Abnormal gait, Improper body mechanics, Increased muscle spasms, Decreased strength, Pain, Decreased activity tolerance, Decreased balance, Decreased endurance  Visit Diagnosis: Chronic pain of left knee  Muscle weakness (generalized)  Other abnormalities of gait and mobility  Localized edema     Problem List Patient Active Problem List   Diagnosis Date Noted  . 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 Right carpal tunnel syndrome 12/06/2019  . Xerosis cutis 10/16/2019  . Greater trochanteric bursitis of both hips 08/09/2018  . Nicotine vapor product user 08/09/2018  . Erythrocytosis 08/09/2018  . Greater trochanteric bursitis of right hip 02/06/2017  . Chronic right SI joint pain 02/06/2017  . Visit for screening mammogram 12/13/2016  . Family history of premature CAD 12/13/2016    . Radiculitis of left cervical region 03/23/2016  . 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 Routine general medical examination at a health care facility 03/22/2013  . Allergic rhinitis, cause unspecified 03/22/2013  . Depression with anxiety 10/31/2011      11/02/2011, PT, DPT 05/08/20 11:55 AM  [facility] [address] [place], [place], [address] Phone: [phone]   Fax:  [phone]  Name: Danielle Cook MRN: [id number] Date of Birth: [birth year]

## 2020-05-13 NOTE — Telephone Encounter (Signed)
Blue sheet done

## 2020-05-19 ENCOUNTER — Other Ambulatory Visit: Payer: Self-pay

## 2020-05-22 ENCOUNTER — Telehealth: Payer: Self-pay | Admitting: Orthopedic Surgery

## 2020-05-22 NOTE — Telephone Encounter (Signed)
Patient is scheduled for left knee ACL Reconstruction with quad allograft, meniscal repair vs debridement on August 12th at [facility] 12:15pm.    Patient is requesting to speak with you regarding length of time out of work. She wants to remind you of her allergy to metal (nickel in particular) and her latex allergy. She mentions having surgical clips for the gall bladder, but no sure what the relevance is to this surgery.    Pt cb [phone]

## 2020-05-25 NOTE — Telephone Encounter (Signed)
See below. Can either of you please discuss with her?

## 2020-05-25 NOTE — Telephone Encounter (Signed)
Called and discussed

## 2020-05-26 NOTE — Telephone Encounter (Signed)
Not using metal

## 2020-06-03 NOTE — Pre-Procedure Instructions (Addendum)
[id number] [place],  - [address] [place] [address] Phone: [phone] Fax: [phone]  [facility] - [place], [place] - [facility] [place] [address] Phone: [phone] Fax: [phone]    Your procedure is scheduled on Thurs., Aug. 12, 2021 from 12:15PM-3:39PM  Report to [facility] Entrance "A" at 10:15AM  Call this number if you have problems the morning of surgery:  [phone]   Remember:  Do not eat after midnight on Aug. 11th  You may drink clear liquids until 3 hours (9:15AM) prior to surgery time.  Clear liquids allowed are:  Water, Juice (non-citric and without pulp - diabetics please choose diet or no sugar options), Carbonated beverages - (diabetics please choose diet or no sugar options), Clear Tea, Black Coffee only (no creamer, milk or cream including half and half), Plain Jell-O only (diabetics please choose diet or no sugar options), Gatorade (diabetics please choose diet or no sugar options) and Plain Popsicles only   Enhanced Recovery after Surgery for Orthopedics Enhanced Recovery after Surgery is a protocol used to improve the stress on your body and your recovery after surgery. .  . The day of surgery (if you do NOT have diabetes):  o Drink ONE (1) Pre-Surgery Clear Ensure by __9:15AM___ am the morning of surgery   o This drink was given to you during your hospital  pre-op appointment visit. o Nothing else to drink after completing the  Pre-Surgery Clear Ensure.         If you have questions, please contact your surgeon's office.     Take these medicines the morning of surgery with A SIP OF WATER: Escitalopram (LEXAPRO)     Lifitegrast Benay Spice)       If Needed: Gabapentin (NEURONTIN)  As of today, STOP taking all Aspirin (unless instructed by your doctor) and Other Aspirin containing products, Vitamins, Fish oils, and Herbal  medications. Also stop all NSAIDS i.e. Advil, Ibuprofen, Motrin, Aleve, Anaprox, Naproxen, BC, Goody Powders, and all Supplements. Including: Meloxicam (MOBIC)   No Smoking of any kind, Tobacco, or Alcohol products 24 hours prior to your procedure. If you use a Cpap at night, you may bring all equipment for your overnight stay.     Special instructions:   [facility]- Preparing For Surgery  Before surgery, you can play an important role. Because skin is not sterile, your skin needs to be as free of germs as possible. You can reduce the number of germs on your skin by washing with CHG (chlorahexidine gluconate) Soap before surgery.  CHG is an antiseptic cleaner which kills germs and bonds with the skin to continue killing germs even after washing.    Please do not use if you have an allergy to CHG or antibacterial soaps. If your skin becomes reddened/irritated stop using the CHG.  Do not shave (including legs and underarms) for at least 48 hours prior to first CHG shower. It is OK to shave your face.  Please follow these instructions carefully.   1. Shower the NIGHT BEFORE SURGERY and the MORNING OF SURGERY with CHG.   2. If you chose to wash your hair, wash your hair first as usual with your normal shampoo.  3. After you shampoo, rinse your hair and body thoroughly to remove the shampoo.  4. Use CHG as you would any other liquid soap. You can apply CHG directly to the skin and wash gently with  a scrungie or a clean washcloth.   5. Apply the CHG Soap to your body ONLY FROM THE NECK DOWN.  Do not use on open wounds or open sores. Avoid contact with your eyes, ears, mouth and genitals (private parts). Wash Face and genitals (private parts)  with your normal soap.  6. Wash thoroughly, paying special attention to the area where your surgery will be performed.  7. Thoroughly rinse your body with warm water from the neck down.  8. DO NOT shower/wash with your normal soap after using and  rinsing off the CHG Soap.  9. Pat yourself dry with a CLEAN TOWEL.  10. Wear CLEAN PAJAMAS to bed the night before surgery, wear comfortable clothes the morning of surgery  11. Place CLEAN SHEETS on your bed the night of your first shower and DO NOT SLEEP WITH PETS.   Day of Surgery:           Remember to brush your teeth WITH YOUR REGULAR TOOTHPASTE.  Do not wear jewelry, make-up or nail polish.  Do not wear lotions, powders, or perfumes, or deodorant.  Do not shave 48 hours prior to surgery.   Do not bring valuables to the hospital.  [facility] is not responsible for any belongings or valuables.  Contacts, dentures or bridgework may not be worn into surgery.   For patients admitted to the hospital, discharge time will be determined by your treatment team.  Patients discharged the day of surgery will not be allowed to drive home, and someone age 65 and over needs to stay with them for 24 hours.  Please wear clean clothes to the hospital/surgery center.    Please read over the following fact sheets that you were given.

## 2020-06-04 ENCOUNTER — Other Ambulatory Visit: Payer: Self-pay

## 2020-06-04 ENCOUNTER — Encounter (HOSPITAL_COMMUNITY)
Admission: RE | Admit: 2020-06-04 | Discharge: 2020-06-04 | Disposition: A | Discharge status: Home / Self Care | Payer: 59 | Source: Ambulatory Visit | Attending: Orthopedic Surgery | Admitting: Orthopedic Surgery

## 2020-06-04 ENCOUNTER — Encounter (HOSPITAL_COMMUNITY): Payer: Self-pay

## 2020-06-04 DIAGNOSIS — Z01812 Encounter for preprocedural laboratory examination: Secondary | ICD-10-CM | POA: Insufficient documentation

## 2020-06-04 HISTORY — DX: Other specified postprocedural states: Z98.890

## 2020-06-04 HISTORY — DX: Other complications of anesthesia, initial encounter: T88.59XA

## 2020-06-04 HISTORY — DX: Nausea with vomiting, unspecified: R11.2

## 2020-06-04 LAB — CBC
HCT: 41.5 % (ref 36.0–46.0)
Hemoglobin: 13.7 g/dL (ref 12.0–15.0)
MCH: 30.2 pg (ref 26.0–34.0)
MCHC: 33 g/dL (ref 30.0–36.0)
MCV: 91.6 fL (ref 80.0–100.0)
Platelets: 204 10*3/uL (ref 150–400)
RBC: 4.53 MIL/uL (ref 3.87–5.11)
RDW: 13 % (ref 11.5–15.5)
WBC: 7.6 10*3/uL (ref 4.0–10.5)
nRBC: 0 % (ref 0.0–0.2)

## 2020-06-04 LAB — BASIC METABOLIC PANEL
Anion gap: 10 (ref 5–15)
BUN: 13 mg/dL (ref 6–20)
CO2: 28 mmol/L (ref 22–32)
Calcium: 9.1 mg/dL (ref 8.9–10.3)
Chloride: 100 mmol/L (ref 98–111)
Creatinine, Ser: 0.61 mg/dL (ref 0.44–1.00)
GFR calc Af Amer: >60 mL/min (ref >60)
GFR calc non Af Amer: >60 mL/min (ref >60)
Glucose, Bld: 84 mg/dL (ref 70–99)
Potassium: 3.4 mmol/L — ABNORMAL LOW (ref 3.5–5.1)
Sodium: 138 mmol/L (ref 135–145)

## 2020-06-04 NOTE — Progress Notes (Signed)
PCP:  Dr. Sanda Linger Cardiologist:  Denies  EKG:  N/A CXR:  N/A ECHO:  Denies Stress Test:  12/22/16 CE Cardiac Cath:  Denies  Covid test 06/08/20  Patient denies shortness of breath, fever, cough, and chest pain at PAT appointment.  Patient verbalized understanding of instructions provided today at the PAT appointment.  Patient asked to review instructions at home and day of surgery.

## 2020-06-08 ENCOUNTER — Other Ambulatory Visit (HOSPITAL_COMMUNITY)
Admission: RE | Admit: 2020-06-08 | Discharge: 2020-06-08 | Disposition: A | Discharge status: Home / Self Care | Payer: 59 | Source: Ambulatory Visit | Attending: Orthopedic Surgery | Admitting: Orthopedic Surgery

## 2020-06-08 DIAGNOSIS — Z20822 Contact with and (suspected) exposure to covid-19: Secondary | ICD-10-CM | POA: Diagnosis not present

## 2020-06-08 DIAGNOSIS — Z01812 Encounter for preprocedural laboratory examination: Secondary | ICD-10-CM | POA: Insufficient documentation

## 2020-06-08 LAB — SARS CORONAVIRUS 2 (TAT 6-24 HRS): SARS Coronavirus 2: NEGATIVE

## 2020-06-10 ENCOUNTER — Encounter: Payer: Self-pay | Admitting: Physical Therapy

## 2020-06-11 ENCOUNTER — Encounter (HOSPITAL_COMMUNITY): Payer: Self-pay | Admitting: Orthopedic Surgery

## 2020-06-11 ENCOUNTER — Ambulatory Visit (HOSPITAL_COMMUNITY): Payer: 59

## 2020-06-11 ENCOUNTER — Ambulatory Visit (HOSPITAL_COMMUNITY)
Admission: RE | Admit: 2020-06-11 | Discharge: 2020-06-11 | Disposition: A | Discharge status: Home / Self Care | Payer: 59 | Attending: Orthopedic Surgery | Admitting: Orthopedic Surgery

## 2020-06-11 ENCOUNTER — Other Ambulatory Visit: Payer: Self-pay | Admitting: Orthopedic Surgery

## 2020-06-11 ENCOUNTER — Other Ambulatory Visit: Payer: Self-pay

## 2020-06-11 ENCOUNTER — Encounter (HOSPITAL_COMMUNITY)
Admission: RE | Disposition: A | Discharge status: Home / Self Care | Payer: Self-pay | Source: Home / Self Care | Attending: Orthopedic Surgery

## 2020-06-11 DIAGNOSIS — S83512D Sprain of anterior cruciate ligament of left knee, subsequent encounter: Secondary | ICD-10-CM | POA: Diagnosis not present

## 2020-06-11 DIAGNOSIS — F419 Anxiety disorder, unspecified: Secondary | ICD-10-CM | POA: Insufficient documentation

## 2020-06-11 DIAGNOSIS — F329 Major depressive disorder, single episode, unspecified: Secondary | ICD-10-CM | POA: Diagnosis not present

## 2020-06-11 DIAGNOSIS — Z6841 Body Mass Index (BMI) 40.0 and over, adult: Secondary | ICD-10-CM | POA: Insufficient documentation

## 2020-06-11 DIAGNOSIS — Z91040 Latex allergy status: Secondary | ICD-10-CM | POA: Insufficient documentation

## 2020-06-11 DIAGNOSIS — S83242D Other tear of medial meniscus, current injury, left knee, subsequent encounter: Secondary | ICD-10-CM

## 2020-06-11 DIAGNOSIS — Z888 Allergy status to other drugs, medicaments and biological substances status: Secondary | ICD-10-CM | POA: Insufficient documentation

## 2020-06-11 DIAGNOSIS — Z79899 Other long term (current) drug therapy: Secondary | ICD-10-CM | POA: Diagnosis not present

## 2020-06-11 DIAGNOSIS — Z91012 Allergy to eggs: Secondary | ICD-10-CM | POA: Diagnosis not present

## 2020-06-11 DIAGNOSIS — S83512A Sprain of anterior cruciate ligament of left knee, initial encounter: Secondary | ICD-10-CM | POA: Insufficient documentation

## 2020-06-11 DIAGNOSIS — Z884 Allergy status to anesthetic agent status: Secondary | ICD-10-CM | POA: Insufficient documentation

## 2020-06-11 DIAGNOSIS — Z791 Long term (current) use of non-steroidal anti-inflammatories (NSAID): Secondary | ICD-10-CM | POA: Insufficient documentation

## 2020-06-11 DIAGNOSIS — F17290 Nicotine dependence, other tobacco product, uncomplicated: Secondary | ICD-10-CM | POA: Insufficient documentation

## 2020-06-11 DIAGNOSIS — Z975 Presence of (intrauterine) contraceptive device: Secondary | ICD-10-CM | POA: Diagnosis not present

## 2020-06-11 DIAGNOSIS — X58XXXA Exposure to other specified factors, initial encounter: Secondary | ICD-10-CM | POA: Diagnosis not present

## 2020-06-11 DIAGNOSIS — Z793 Long term (current) use of hormonal contraceptives: Secondary | ICD-10-CM | POA: Insufficient documentation

## 2020-06-11 DIAGNOSIS — S83242A Other tear of medial meniscus, current injury, left knee, initial encounter: Secondary | ICD-10-CM | POA: Insufficient documentation

## 2020-06-11 HISTORY — PX: ANTERIOR CRUCIATE LIGAMENT REPAIR: SHX115

## 2020-06-11 LAB — POCT PREGNANCY, URINE: Preg Test, Ur: NEGATIVE

## 2020-06-11 SURGERY — RECONSTRUCTION, KNEE, ACL, USING HAMSTRING GRAFT
Anesthesia: Regional | Laterality: Left

## 2020-06-11 MED ORDER — MIDAZOLAM HCL 2 MG/2ML IJ SOLN: 2.0000 mg | INTRAMUSCULAR | Status: AC

## 2020-06-11 MED ORDER — CLONIDINE HCL (ANALGESIA) 100 MCG/ML EP SOLN
EPIDURAL | Status: DC | PRN
  Administered 2020-06-11: 100 ug

## 2020-06-11 MED ORDER — BUPIVACAINE HCL (PF) 0.25 % IJ SOLN
INTRAMUSCULAR | Status: AC
  Filled 2020-06-11: qty 30

## 2020-06-11 MED ORDER — OXYCODONE HCL 5 MG PO TABS
ORAL_TABLET | ORAL | Status: AC
  Filled 2020-06-11: qty 1

## 2020-06-11 MED ORDER — CEFAZOLIN SODIUM-DEXTROSE 2-4 GM/100ML-% IV SOLN
2.0000 g | INTRAVENOUS | Status: AC
  Administered 2020-06-11: 2 g via INTRAVENOUS

## 2020-06-11 MED ORDER — EPHEDRINE SULFATE 50 MG/ML IJ SOLN
INTRAMUSCULAR | Status: DC | PRN
  Administered 2020-06-11: 5 mg via INTRAVENOUS

## 2020-06-11 MED ORDER — DEXAMETHASONE SODIUM PHOSPHATE 10 MG/ML IJ SOLN
INTRAMUSCULAR | Status: DC | PRN
  Administered 2020-06-11: 5 mg via INTRAVENOUS

## 2020-06-11 MED ORDER — EPINEPHRINE PF 1 MG/ML IJ SOLN
INTRAMUSCULAR | Status: DC | PRN
  Administered 2020-06-11 (×3): 1 mg

## 2020-06-11 MED ORDER — CHLORHEXIDINE GLUCONATE 0.12 % MT SOLN
OROMUCOSAL | Status: AC
  Administered 2020-06-11: 15 mL via OROMUCOSAL
  Filled 2020-06-11: qty 15

## 2020-06-11 MED ORDER — ONDANSETRON HCL 4 MG/2ML IJ SOLN
INTRAMUSCULAR | Status: AC
  Filled 2020-06-11: qty 2

## 2020-06-11 MED ORDER — CHLORHEXIDINE GLUCONATE 0.12 % MT SOLN: 15.0000 mL | Freq: Once | OROMUCOSAL | Status: AC

## 2020-06-11 MED ORDER — FENTANYL CITRATE (PF) 100 MCG/2ML IJ SOLN
INTRAMUSCULAR | Status: DC | PRN
  Administered 2020-06-11 (×4): 50 ug via INTRAVENOUS

## 2020-06-11 MED ORDER — FENTANYL CITRATE (PF) 100 MCG/2ML IJ SOLN
INTRAMUSCULAR | Status: AC
  Filled 2020-06-11: qty 2

## 2020-06-11 MED ORDER — ROCURONIUM BROMIDE 10 MG/ML (PF) SYRINGE
PREFILLED_SYRINGE | INTRAVENOUS | Status: AC
  Filled 2020-06-11: qty 10

## 2020-06-11 MED ORDER — DIPHENHYDRAMINE HCL 50 MG/ML IJ SOLN
INTRAMUSCULAR | Status: DC | PRN
  Administered 2020-06-11: 12.5 mg via INTRAVENOUS

## 2020-06-11 MED ORDER — LIDOCAINE 2% (20 MG/ML) 5 ML SYRINGE
INTRAMUSCULAR | Status: AC
  Filled 2020-06-11: qty 5

## 2020-06-11 MED ORDER — LACTATED RINGERS IV SOLN: INTRAVENOUS | Status: DC

## 2020-06-11 MED ORDER — FENTANYL CITRATE (PF) 100 MCG/2ML IJ SOLN: 100.0000 ug | INTRAMUSCULAR | Status: AC

## 2020-06-11 MED ORDER — FENTANYL CITRATE (PF) 100 MCG/2ML IJ SOLN
25.0000 ug | INTRAMUSCULAR | Status: DC | PRN
  Administered 2020-06-11 (×3): 50 ug via INTRAVENOUS

## 2020-06-11 MED ORDER — METHOCARBAMOL 500 MG PO TABS: 500.0000 mg | ORAL_TABLET | Freq: Four times a day (QID) | ORAL | 0 refills | Status: DC

## 2020-06-11 MED ORDER — LIDOCAINE 20MG/ML (2%) 15 ML SYRINGE OPTIME
INTRAMUSCULAR | Status: DC | PRN
  Administered 2020-06-11: 80 mg via INTRAVENOUS

## 2020-06-11 MED ORDER — 0.9 % SODIUM CHLORIDE (POUR BTL) OPTIME
TOPICAL | Status: DC | PRN
  Administered 2020-06-11 (×2): 1000 mL

## 2020-06-11 MED ORDER — OXYCODONE HCL 5 MG PO TABS: 5.0000 mg | ORAL_TABLET | ORAL | 0 refills | Status: DC | PRN

## 2020-06-11 MED ORDER — ACETAMINOPHEN 500 MG PO TABS: 1000.0000 mg | ORAL_TABLET | Freq: Once | ORAL | Status: AC

## 2020-06-11 MED ORDER — CLONIDINE HCL (ANALGESIA) 100 MCG/ML EP SOLN
EPIDURAL | Status: AC
  Filled 2020-06-11: qty 10

## 2020-06-11 MED ORDER — BUPIVACAINE-EPINEPHRINE (PF) 0.25% -1:200000 IJ SOLN
INTRAMUSCULAR | Status: AC
  Filled 2020-06-11: qty 20

## 2020-06-11 MED ORDER — PROMETHAZINE HCL 25 MG/ML IJ SOLN: 6.2500 mg | INTRAMUSCULAR | Status: DC | PRN

## 2020-06-11 MED ORDER — SCOPOLAMINE 1 MG/3DAYS TD PT72: 1.0000 | MEDICATED_PATCH | TRANSDERMAL | Status: DC

## 2020-06-11 MED ORDER — MIDAZOLAM HCL 2 MG/2ML IJ SOLN
INTRAMUSCULAR | Status: AC
  Administered 2020-06-11: 2 mg via INTRAVENOUS
  Filled 2020-06-11: qty 2

## 2020-06-11 MED ORDER — BUPIVACAINE HCL 0.25 % IJ SOLN
INTRAMUSCULAR | Status: DC | PRN
  Administered 2020-06-11: 30 mL

## 2020-06-11 MED ORDER — MORPHINE SULFATE (PF) 4 MG/ML IV SOLN
INTRAVENOUS | Status: AC
  Filled 2020-06-11: qty 2

## 2020-06-11 MED ORDER — FENTANYL CITRATE (PF) 250 MCG/5ML IJ SOLN
INTRAMUSCULAR | Status: AC
  Filled 2020-06-11: qty 5

## 2020-06-11 MED ORDER — MORPHINE SULFATE (PF) 4 MG/ML IV SOLN
INTRAVENOUS | Status: DC | PRN
  Administered 2020-06-11: 8 mg via INTRAVENOUS

## 2020-06-11 MED ORDER — PROPOFOL 10 MG/ML IV BOLUS
INTRAVENOUS | Status: AC
  Filled 2020-06-11: qty 20

## 2020-06-11 MED ORDER — PROPOFOL 10 MG/ML IV BOLUS
INTRAVENOUS | Status: DC | PRN
  Administered 2020-06-11: 200 mg via INTRAVENOUS

## 2020-06-11 MED ORDER — POVIDONE-IODINE 7.5 % EX SOLN
Freq: Once | CUTANEOUS | Status: AC
  Filled 2020-06-11: qty 118

## 2020-06-11 MED ORDER — FENTANYL CITRATE (PF) 100 MCG/2ML IJ SOLN
INTRAMUSCULAR | Status: AC
  Administered 2020-06-11: 100 ug via INTRAVENOUS
  Filled 2020-06-11: qty 2

## 2020-06-11 MED ORDER — EPINEPHRINE PF 1 MG/ML IJ SOLN
INTRAMUSCULAR | Status: AC
  Filled 2020-06-11: qty 4

## 2020-06-11 MED ORDER — ORAL CARE MOUTH RINSE: 15.0000 mL | Freq: Once | OROMUCOSAL | Status: AC

## 2020-06-11 MED ORDER — ASPIRIN 81 MG PO CHEW
81.0000 mg | CHEWABLE_TABLET | Freq: Every day | ORAL | 0 refills | Status: DC
Start: 2020-06-11 — End: 2020-07-03

## 2020-06-11 MED ORDER — SODIUM CHLORIDE 0.9 % IR SOLN
Status: DC | PRN
  Administered 2020-06-11 (×4): 3000 mL

## 2020-06-11 MED ORDER — BUPIVACAINE-EPINEPHRINE 0.25% -1:200000 IJ SOLN
INTRAMUSCULAR | Status: DC | PRN
  Administered 2020-06-11: 15 mL

## 2020-06-11 MED ORDER — CEFAZOLIN SODIUM-DEXTROSE 2-4 GM/100ML-% IV SOLN
INTRAVENOUS | Status: AC
  Filled 2020-06-11: qty 100

## 2020-06-11 MED ORDER — KETOROLAC TROMETHAMINE 30 MG/ML IJ SOLN
30.0000 mg | Freq: Once | INTRAMUSCULAR | Status: AC
  Administered 2020-06-11: 30 mg via INTRAVENOUS

## 2020-06-11 MED ORDER — OXYCODONE HCL 5 MG PO TABS
5.0000 mg | ORAL_TABLET | Freq: Once | ORAL | Status: AC | PRN
  Administered 2020-06-11: 5 mg via ORAL

## 2020-06-11 MED ORDER — ROCURONIUM BROMIDE 10 MG/ML (PF) SYRINGE
PREFILLED_SYRINGE | INTRAVENOUS | Status: DC | PRN
  Administered 2020-06-11: 70 mg via INTRAVENOUS

## 2020-06-11 MED ORDER — SUGAMMADEX SODIUM 200 MG/2ML IV SOLN
INTRAVENOUS | Status: DC | PRN
  Administered 2020-06-11: 200 mg via INTRAVENOUS

## 2020-06-11 MED ORDER — ONDANSETRON HCL 4 MG/2ML IJ SOLN
INTRAMUSCULAR | Status: DC | PRN
  Administered 2020-06-11: 4 mg via INTRAVENOUS

## 2020-06-11 MED ORDER — DEXAMETHASONE SODIUM PHOSPHATE 10 MG/ML IJ SOLN
INTRAMUSCULAR | Status: AC
  Filled 2020-06-11: qty 1

## 2020-06-11 MED ORDER — KETOROLAC TROMETHAMINE 30 MG/ML IJ SOLN
INTRAMUSCULAR | Status: AC
  Filled 2020-06-11: qty 1

## 2020-06-11 MED ORDER — ACETAMINOPHEN 500 MG PO TABS
ORAL_TABLET | ORAL | Status: AC
  Administered 2020-06-11: 1000 mg via ORAL
  Filled 2020-06-11: qty 2

## 2020-06-11 MED ORDER — SCOPOLAMINE 1 MG/3DAYS TD PT72
MEDICATED_PATCH | TRANSDERMAL | Status: AC
  Administered 2020-06-11: 1.5 mg via TRANSDERMAL
  Filled 2020-06-11: qty 1

## 2020-06-11 MED ORDER — BUPIVACAINE-EPINEPHRINE (PF) 0.5% -1:200000 IJ SOLN
INTRAMUSCULAR | Status: DC | PRN
Start: 2020-06-11 — End: 2020-06-11
  Administered 2020-06-11: 30 mL

## 2020-06-11 MED ORDER — OXYCODONE HCL 5 MG/5ML PO SOLN: 5.0000 mg | Freq: Once | ORAL | Status: AC | PRN

## 2020-06-11 MED ORDER — POVIDONE-IODINE 10 % EX SWAB
2.0000 "application " | Freq: Once | CUTANEOUS | Status: AC
  Administered 2020-06-11: 2 via TOPICAL

## 2020-06-11 SURGICAL SUPPLY — 81 items
ALCOHOL 70% 16 OZ (MISCELLANEOUS) ×3 IMPLANT
ANCHOR PEEK 4.75X19.1 SWLK C (Anchor) ×3 IMPLANT
BLADE EXCALIBUR 4.0MM X 13CM (MISCELLANEOUS) ×1
BLADE EXCALIBUR 4.0X13 (MISCELLANEOUS) ×2 IMPLANT
BLADE SURG 10 STRL SS (BLADE) ×3 IMPLANT
BLADE SURG 15 STRL LF DISP TIS (BLADE) ×2 IMPLANT
BLADE SURG 15 STRL SS (BLADE) ×4
BNDG ELASTIC 6X15 VLCR STRL LF (GAUZE/BANDAGES/DRESSINGS) ×3 IMPLANT
BURR OVAL 8 FLU 4.0MM X 13CM (MISCELLANEOUS) ×1
BURR OVAL 8 FLU 4.0X13 (MISCELLANEOUS) ×2 IMPLANT
COVER SURGICAL LIGHT HANDLE (MISCELLANEOUS) ×3 IMPLANT
CUFF TOURN SGL QUICK 34 (TOURNIQUET CUFF) ×2
CUFF TRNQT CYL 34X4.125X (TOURNIQUET CUFF) ×1 IMPLANT
DISSECTOR  3.8MM X 13CM (MISCELLANEOUS) ×2
DISSECTOR 3.8MM X 13CM (MISCELLANEOUS) ×1 IMPLANT
DISSECTOR 4.0MM X 13CM (MISCELLANEOUS) ×3 IMPLANT
DRAPE ARTHROSCOPY W/POUCH 114 (DRAPES) ×3 IMPLANT
DRAPE INCISE IOBAN 66X45 STRL (DRAPES) ×3 IMPLANT
DRAPE OEC MINIVIEW 54X84 (DRAPES) IMPLANT
DRAPE ORTHO SPLIT 77X108 STRL (DRAPES) ×2
DRAPE SURG ORHT 6 SPLT 77X108 (DRAPES) ×1 IMPLANT
DRAPE U-SHAPE 47X51 STRL (DRAPES) ×3 IMPLANT
DRILL FLIPCUTTER II 7.5MM (MISCELLANEOUS) IMPLANT
DRILL FLIPCUTTER II 8.0MM (INSTRUMENTS) IMPLANT
DRILL FLIPCUTTER II 8.5MM (INSTRUMENTS) IMPLANT
DRILL FLIPCUTTER II 9.0MM (INSTRUMENTS) IMPLANT
DRSG TEGADERM 4X4.5 CHG (GAUZE/BANDAGES/DRESSINGS) ×3 IMPLANT
DRSG XEROFORM 1X8 (GAUZE/BANDAGES/DRESSINGS) ×3 IMPLANT
DURAPREP 26ML APPLICATOR (WOUND CARE) ×6 IMPLANT
DW OUTFLOW CASSETTE/TUBE SET (MISCELLANEOUS) ×3 IMPLANT
ELECT REM PT RETURN 9FT ADLT (ELECTROSURGICAL) ×3
ELECTRODE REM PT RTRN 9FT ADLT (ELECTROSURGICAL) ×1 IMPLANT
FIBERSTICK 2 (SUTURE) ×6 IMPLANT
FLIPCUTTER II 7.5MM (MISCELLANEOUS)
FLIPCUTTER II 8.0MM (INSTRUMENTS)
FLIPCUTTER II 8.5MM (INSTRUMENTS)
FLIPCUTTER II 9.0MM (INSTRUMENTS)
GAUZE SPONGE 4X4 12PLY STRL (GAUZE/BANDAGES/DRESSINGS) ×3 IMPLANT
GLOVE BIOGEL PI IND STRL 8 (GLOVE) ×1 IMPLANT
GLOVE BIOGEL PI INDICATOR 8 (GLOVE) ×2
GLOVE ECLIPSE 8.0 STRL XLNG CF (GLOVE) ×3 IMPLANT
GLOVE ORTHO TXT STRL SZ7.5 (GLOVE) ×3 IMPLANT
GOWN STRL REUS W/ TWL LRG LVL3 (GOWN DISPOSABLE) ×3 IMPLANT
GOWN STRL REUS W/TWL LRG LVL3 (GOWN DISPOSABLE) ×6
IMMOBILIZER KNEE 22 UNIV (SOFTGOODS) ×3 IMPLANT
IMP SYS 2ND FIX PEEK 4.75X19.1 (Miscellaneous) ×3 IMPLANT
IMPL SYS 2ND FX PEEK 4.75X19.1 (Miscellaneous) ×1 IMPLANT
KIT BASIN OR (CUSTOM PROCEDURE TRAY) ×3 IMPLANT
KIT BIOCARTILAGE DEL W/SYRINGE (KITS) IMPLANT
KIT BIOCARTILAGE LG JOINT MIX (KITS) ×3 IMPLANT
KIT TURNOVER KIT B (KITS) ×3 IMPLANT
MANIFOLD NEPTUNE II (INSTRUMENTS) ×3 IMPLANT
NEEDLE 18GX1X1/2 (RX/OR ONLY) (NEEDLE) ×3 IMPLANT
NEEDLE HYPO 18GX1.5 BLUNT FILL (NEEDLE) ×3 IMPLANT
NS IRRIG 1000ML POUR BTL (IV SOLUTION) ×3 IMPLANT
PACK ARTHROSCOPY DSU (CUSTOM PROCEDURE TRAY) ×3 IMPLANT
PAD ARMBOARD 7.5X6 YLW CONV (MISCELLANEOUS) ×6 IMPLANT
PENCIL BUTTON HOLSTER BLD 10FT (ELECTRODE) ×3 IMPLANT
PUTTY DBM ALLOSYNC PURE 5CC (Putty) ×3 IMPLANT
SCREW BIO VENTED FT 7X30 (Screw) ×3 IMPLANT
SCREW BIOCOMP 7X20 (Screw) ×3 IMPLANT
SUCTION FRAZIER HANDLE 10FR (MISCELLANEOUS) ×2
SUCTION TUBE FRAZIER 10FR DISP (MISCELLANEOUS) ×1 IMPLANT
SUT ETHILON 3 0 PS 1 (SUTURE) ×6 IMPLANT
SUT MNCRL AB 3-0 PS2 18 (SUTURE) ×3 IMPLANT
SUT VIC AB 0 CT1 27 (SUTURE) ×2
SUT VIC AB 0 CT1 27XBRD ANBCTR (SUTURE) ×1 IMPLANT
SUT VIC AB 2-0 CT1 27 (SUTURE) ×2
SUT VIC AB 2-0 CT1 TAPERPNT 27 (SUTURE) ×1 IMPLANT
SUT VICRYL 0 UR6 27IN ABS (SUTURE) ×3 IMPLANT
SYR 30ML LL (SYRINGE) ×3 IMPLANT
SYR 3ML LL SCALE MARK (SYRINGE) ×3 IMPLANT
SYR BULB IRRIG 60ML STRL (SYRINGE) ×3 IMPLANT
SYR TB 1ML LUER SLIP (SYRINGE) ×3 IMPLANT
TISSUE FLEXIGRAFT QUADLINK (Tissue) ×3 IMPLANT
TOWEL GREEN STERILE (TOWEL DISPOSABLE) ×3 IMPLANT
TOWEL GREEN STERILE FF (TOWEL DISPOSABLE) ×3 IMPLANT
TUBING ARTHROSCOPY IRRIG 16FT (MISCELLANEOUS) ×3 IMPLANT
UNDERPAD 30X36 HEAVY ABSORB (UNDERPADS AND DIAPERS) ×3 IMPLANT
WRAP KNEE MAXI GEL POST OP (GAUZE/BANDAGES/DRESSINGS) ×3 IMPLANT
YANKAUER SUCT BULB TIP NO VENT (SUCTIONS) ×3 IMPLANT

## 2020-06-11 NOTE — Anesthesia Postprocedure Evaluation (Signed)
Anesthesia Post Note  Patient: Danielle Cook  Procedure(s) Performed: LEFT KNEE ANTERIOR CRUCIATE LIGAMENT (ACL) RECONSTRUCTION WITH WITH QUAD ALLOGRAFT, MENISCAL REPAIR vs DEBRIDEMENT (Left )     Patient location during evaluation: PACU Anesthesia Type: Regional and General Level of consciousness: awake and alert Pain management: pain level controlled Vital Signs Assessment: post-procedure vital signs reviewed and stable Respiratory status: spontaneous breathing, nonlabored ventilation and respiratory function stable Cardiovascular status: blood pressure returned to baseline and stable Postop Assessment: no apparent nausea or vomiting Anesthetic complications: no   No complications documented.  Last Vitals:  Vitals:   06/11/20 1650 06/11/20 1720  BP: (!) 144/86 140/82  Pulse: 86 86  Resp: 11 13  Temp:    SpO2: 97% 97%    Last Pain:  Vitals:   06/11/20 1720  TempSrc:   PainSc: 3                  Candra R Dearion Huot

## 2020-06-11 NOTE — Anesthesia Procedure Notes (Addendum)
Procedure Name: Intubation Date/Time: 06/11/2020 12:19 PM Performed by: Lowella Dell, CRNA Pre-anesthesia Checklist: Patient identified, Emergency Drugs available, Suction available and Patient being monitored Patient Re-evaluated:Patient Re-evaluated prior to induction Oxygen Delivery Method: Circle System Utilized Preoxygenation: Pre-oxygenation with 100% oxygen Induction Type: IV induction Ventilation: Mask ventilation without difficulty Laryngoscope Size: Mac and 3 Grade View: Grade I Tube type: Oral Tube size: 7.0 mm Number of attempts: 1 Airway Equipment and Method: Stylet Placement Confirmation: ETT inserted through vocal cords under direct vision,  positive ETCO2 and breath sounds checked- equal and bilateral Secured at: 20 cm Tube secured with: Tape Dental Injury: Teeth and Oropharynx as per pre-operative assessment  Comments: AOI by Pierce Crane., SRNA under direct supervision.

## 2020-06-11 NOTE — H&P (Signed)
Danielle Cook is an 44 y.o. female.   Chief Complaint: Left knee pain HPI: Findings a 44 year old patient with left knee pain.  Sustained an injury several months ago.  Has ACL tear with symptomatic instability as well as meniscal root tear.  Presents now for operative management after estimation of risk benefits.  She is failed a course of rehabilitation.  She does describe a history consistent with possible metal allergy.  She would like to avoid metal if possible.  Plan for screw fixation of the graft with supplemental Sopala fixation if needed.  Plan to use allograft for quicker healing.  And less time out of work.  Past Medical History:  Diagnosis Date  . Abnormal Pap smear   . Allergy   . Complication of anesthesia   . History of chicken pox   . 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 Hyperlipidemia   . Migraine   . PONV (postoperative nausea and vomiting)   . PPD positive, treated 2004    Past Surgical History:  Procedure Laterality Date  . CHOLECYSTECTOMY, LAPAROSCOPIC  07/13/2000  . COLPOSCOPY  2000,   . TONSILLECTOMY    . WISDOM TOOTH EXTRACTION  1995 Jan    Family History  Problem Relation Age of Onset  . Stroke Mother   . Dementia Mother   . Hyperlipidemia Mother   . Early death Father   . Heart disease Father   . Hyperlipidemia Father   . Cancer Neg Hx   . Depression Neg Hx   . Diabetes Neg Hx   . Drug abuse Neg Hx   . Hearing loss Neg Hx   . Hypertension Neg Hx    Social History:  reports that she has been smoking e-cigarettes. She has a 3.75 pack-year smoking history. She has never used smokeless tobacco. She reports that she does not drink alcohol and does not use drugs.  Allergies:  Allergies  Allergen Reactions  . Eggs Or Egg-Derived Products     Sensitivity   . Guaifenesin Er Anxiety and Palpitations  . Latex Hives and Rash  . Wellbutrin [Bupropion Hcl] Anxiety and Palpitations    Medications Prior to Admission  Medication Sig Dispense Refill  .  augmented betamethasone dipropionate (DIPROLENE AF) 0.05 % cream Apply topically 2 (two) times daily. (Patient taking differently: Apply 1 application topically 2 (two) times daily as needed (dry skin). ) 30 g 0  . escitalopram (LEXAPRO) 20 MG tablet TAKE 1 TABLET BY MOUTH EVERY DAY (Patient taking differently: Take 20 mg by mouth daily. ) 30 tablet 5  . gabapentin (NEURONTIN) 100 MG capsule Take 2 capsules (200 mg total) by mouth at bedtime. (Patient taking differently: Take 100-300 mg by mouth See admin instructions. 100 mg in the morning if needed, 300 mg at bedtime) 180 capsule 3  . levonorgestrel (MIRENA, 52 MG,) 20 MCG/24HR IUD 1 Intra Uterine Device (1 each total) by Intrauterine route once. 1 each 0  . Lifitegrast (XIIDRA) 5 % SOLN Place 1 drop into both eyes in the morning and at bedtime.    . meloxicam (MOBIC) 15 MG tablet TAKE 1 TABLET BY MOUTH EVERY DAY (Patient taking differently: Take 15 mg by mouth daily as needed for pain. ) 30 tablet 11  . Misc Natural Products (ELDERBERRY ZINC/VIT C/IMMUNE MT) Take 1 tablet by mouth daily.    Marland Kitchen gabapentin (NEURONTIN) 100 MG capsule Take 2 capsules (200 mg total) by mouth at bedtime. (Patient not taking: Reported on 05/28/2020) 60 capsule  3  . Vitamin D, Ergocalciferol, (DRISDOL) 1.25 MG (50000 UNIT) CAPS capsule TAKE 1 CAPSULE BY MOUTH EVERY 7 DAYS (Patient taking differently: Take 50,000 Units by mouth every Tuesday. ) 4 capsule 2    Results for orders placed or performed during the hospital encounter of 06/11/20 (from the past 48 hour(s))  Pregnancy, urine POC     Status: None   Collection Time: 06/11/20 10:48 AM  Result Value Ref Range   Preg Test, Ur NEGATIVE NEGATIVE    Comment:        THE SENSITIVITY OF THIS METHODOLOGY IS >24 mIU/mL    No results found.  Review of Systems  Musculoskeletal: Positive for arthralgias.  All other systems reviewed and are negative.   Blood pressure (!) 153/84, pulse 62, temperature 97.9 F (36.6 C),  temperature source Oral, resp. rate 18, height 5\' 4"  (1.626 m), weight 107 kg, last menstrual period 06/01/2020, SpO2 98 %. Physical Exam Vitals reviewed.  HENT:     Head: Normocephalic.     Nose: Nose normal.     Mouth/Throat:     Mouth: Mucous membranes are moist.  Eyes:     Pupils: Pupils are equal, round, and reactive to light.  Cardiovascular:     Rate and Rhythm: Normal rate.     Pulses: Normal pulses.  Pulmonary:     Effort: Pulmonary effort is normal.  Abdominal:     General: Abdomen is flat.  Musculoskeletal:     Cervical back: Normal range of motion.  Skin:    General: Skin is warm.     Capillary Refill: Capillary refill takes less than 2 seconds.  Neurological:     General: No focal deficit present.     Mental Status: She is alert.  Psychiatric:        Mood and Affect: Mood normal.   Examination of the left knee demonstrates ACL laxity with positive Lachman.  Range of motion is full.  No posterior lateral rotatory instability is noted.  Pedal pulses palpable.  Ankle dorsiflexion intact.  Assessment/Plan Impression is left knee ACL tear and medial meniscal root tear.  Plan is left knee ACL reconstruction with allograft.  Want to try to avoid metal in this patient as she does describe metal allergy.  Meniscal root repair also to be attempted if possible.  Risk benefits of surgery discussed include not limited to infection nerve vessel damage knee stiffness as well as prolonged rehabilitation required as well as a period of nonweightbearing with meniscal repair.  All questions answered.  No personal or family history of DVT or pulmonary embolism.  08/01/2020, MD 06/11/2020, 11:13 AM

## 2020-06-11 NOTE — Op Note (Signed)
NAME: Danielle Cook, GUNDRY MEDICAL RECORD MP:[id number] ACCOUNT [id number] DATE OF BIRTH:[birth year] FACILITY: [facility] LOCATION: [facility] PHYSICIAN:Ranferi Clingan Diamantina Providence, MD  OPERATIVE REPORT  DATE OF PROCEDURE:  06/11/2020  PREOPERATIVE DIAGNOSIS:  Left knee anterior cruciate ligament tear and medial meniscal tear.  POSTOPERATIVE DIAGNOSIS:  Left knee anterior cruciate ligament tear and medial meniscal tear.  PROCEDURE:  Left knee anterior cruciate ligament reconstruction with quad allograft using Arthrex screw fixation, nonmetallic with partial medial meniscectomy.  SURGEON:  Cammy Copa, MD  ASSISTANT:  April Green, RNFA.  INDICATIONS:  The patient is a 44 year old female with left knee pain and instability.  She presents for operative management after explanation of risks and benefits.  PROCEDURE IN DETAIL:  The patient was brought to the operating room where general anesthetic was induced.  Preoperative antibiotics administered.  Timeout was called.  The left knee examined under anesthesia and found to have good stability to varus and  valgus stress at 0 and 30 degrees.  No posterolateral rotatory instability was noted.  PCL intact.  ACL laxity was present with positive Lachman, positive pivot shift and positive anterior drawer.  Left leg prescrubbed with alcohol and Betadine, allowed  to air dry, prepped with DuraPrep solution and draped in sterile manner.  Ioban used to cover the operative field.  Tourniquet was not utilized.  After examination under anesthesia, preparation on the quadriceps allograft was performed.  The patient  concurrently underwent arthroscopic evaluation of the left knee.  The anterior inferolateral and anterior inferomedial portals were established.  Diagnostic arthroscopy was performed.  The patient had a torn ACL.  PCL was intact.  In the medial  compartment, she had a tear involving 50% of the anterior, posterior width of the meniscus.  The meniscal tear,  which was unstable, was debrided back to a stable rim using combination of basket punch and shaver.  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  Loose chondral flaps also debrided in this region.  Next, the lateral compartment was inspected.  The lateral meniscus was stable.  No instability.  Articular  surfaces intact.  Patellofemoral compartment also appeared intact with grade I changes on the patella and trochlea.  No loose bodies in the medial or lateral gutter.  At this point, the ACL stump was debrided.  Notchplasty performed.  The guide was  placed and a 9 mm tunnel was drilled in the 3 o'clock position on the lateral femoral condyle.  In a similar manner, a 9 mm tunnel drilled in the tibia.  The graft was then passed through the tibia into the femur, fixed on the femoral side using a 7 x 20  mm interference screw placed through the anteromedial portal with the knee hyperflexed.  This was backed up on the lateral femur using a SwiveLock.  Next, the graft was taken through range of motion, found to have good isometry, fixed on the tibial side  in extension using 7 x 30 mm interference screw and the suture limbs were then put into a SwiveLock for additional backup fixation.  The patient had excellent knee stability, taken through a range of motion with no impingement of the graft.  Thorough  irrigation was performed of the knee joint, as well as the incisions.  Portals closed using 2-0 Vicryl, 3-0 nylon.  The lateral incision and the anterior tibial incision closed using 0 Vicryl suture, 2-0 Vicryl suture and a 3-0 nylon.  Solution of  Marcaine, morphine, clonidine injected into the portals, into the knee and around the incisions.  Impervious dressings placed.  The patient tolerated the procedure well without immediate complications.  Bulky knee wrap and immobilizer were placed.  VN/NUANCE  D:06/11/2020 T:06/11/2020  JOB:[id number]

## 2020-06-11 NOTE — Anesthesia Preprocedure Evaluation (Addendum)
Anesthesia Evaluation  Patient identified by MRN, date of birth, ID band  History of Anesthesia Complications (+) PONV and history of anesthetic complications  Airway Mallampati: II  TM Distance: >3 FB Neck ROM: Full    Dental no notable dental hx.    Pulmonary Current Smoker,    Pulmonary exam normal breath sounds clear to auscultation       Cardiovascular Exercise Tolerance: Good negative cardio ROS Normal cardiovascular exam Rhythm:Regular Rate:Normal     Neuro/Psych  Headaches (migraines), PSYCHIATRIC DISORDERS Anxiety Depression    GI/Hepatic negative GI ROS, Neg liver ROS,   Endo/Other  Morbid obesity  Renal/GU negative Renal ROS  negative genitourinary   Musculoskeletal negative musculoskeletal ROS (+)   Abdominal Normal abdominal exam  (+)   Peds  Hematology negative hematology ROS (+)   Anesthesia Other Findings   Reproductive/Obstetrics                           Anesthesia Physical Anesthesia Plan  ASA: II  Anesthesia Plan: Regional and General   Post-op Pain Management: GA combined w/ Regional for post-op pain   Induction: Intravenous  PONV Risk Score and Plan: 3 and Ondansetron, Dexamethasone, Midazolam and Scopolamine patch - Pre-op  Airway Management Planned: Oral ETT  Additional Equipment:   Intra-op Plan:   Post-operative Plan: Extubation in OR  Informed Consent: I have reviewed the patients History and Physical, chart, labs and discussed the procedure including the risks, benefits and alternatives for the proposed anesthesia with the patient or authorized representative who has indicated his/her understanding and acceptance.       Plan Discussed with:   Anesthesia Plan Comments: (Adductor canal block for postop pain control. GETA. 1 PIV. )       Anesthesia Quick Evaluation

## 2020-06-11 NOTE — Brief Op Note (Signed)
   06/11/2020  3:05 PM  PATIENT:  Danielle Cook  44 y.o. female  PRE-OPERATIVE DIAGNOSIS:  left knee anterior cruciate ligament tear, meniscal tear  POST-OPERATIVE DIAGNOSIS:  left knee anterior cruciate ligament tear, meniscal tear  PROCEDURE:  Procedure(s): LEFT KNEE ANTERIOR CRUCIATE LIGAMENT (ACL) RECONSTRUCTION WITH WITH QUAD ALLOGRAFT, MENISCAL DEBRIDEMENT  SURGEON:  Surgeon(s): August Saucer, Corrie Mckusick, MD  ASSISTANT: Chilton Si rnfa  ANESTHESIA:   general  EBL: 15 ml    Total I/O In: 1500 [I.V.:1400; IV Piggyback:100] Out: 30 [Blood:30]  BLOOD ADMINISTERED: none  DRAINS: none   LOCAL MEDICATIONS USED:  Marcaine mso4 clonidine  SPECIMEN:  No Specimen  COUNTS:  YES  TOURNIQUET:  * No tourniquets in log *  DICTATION: .Other Dictation: Dictation Number [id number]  PLAN OF CARE: Discharge to home after PACU  PATIENT DISPOSITION:  PACU - hemodynamically stable

## 2020-06-11 NOTE — Anesthesia Procedure Notes (Signed)
Anesthesia Regional Block: Adductor canal block   Pre-Anesthetic Checklist: ,, timeout performed, Correct Patient, Correct Site, Correct Laterality, Correct Procedure, Correct Position, site marked, Risks and benefits discussed,  Surgical consent,  Pre-op evaluation,  At surgeon's request and post-op pain management  Laterality: Left  Prep: chloraprep       Needles:  Injection technique: Single-shot  Needle Type: Stimiplex     Needle Length: 9cm  Needle Gauge: 21     Additional Needles:   Procedures:,,,, ultrasound used (permanent image in chart),,,,  Narrative:  Start time: 06/11/2020 11:17 AM End time: 06/11/2020 11:27 AM Injection made incrementally with aspirations every 5 mL. Anesthesiologist: Mellody Dance, MD

## 2020-06-11 NOTE — Transfer of Care (Signed)
Immediate Anesthesia Transfer of Care Note  Patient: Danielle Cook  Procedure(s) Performed: LEFT KNEE ANTERIOR CRUCIATE LIGAMENT (ACL) RECONSTRUCTION WITH WITH QUAD ALLOGRAFT, MENISCAL REPAIR vs DEBRIDEMENT (Left )  Patient Location: PACU  Anesthesia Type:GA combined with regional for post-op pain  Level of Consciousness: drowsy  Airway & Oxygen Therapy: Patient Spontanous Breathing and Patient connected to face mask oxygen  Post-op Assessment: Report given to RN and Post -op Vital signs reviewed and stable  Post vital signs: Reviewed and stable  Last Vitals:  Vitals Value Taken Time  BP 147/86 06/11/20 1505  Temp    Pulse 115 06/11/20 1507  Resp 13 06/11/20 1507  SpO2 100 % 06/11/20 1507  Vitals shown include unvalidated device data.  Last Pain:  Vitals:   06/11/20 1126  TempSrc:   PainSc: 0-No pain      Patients Stated Pain Goal: 3 (06/11/20 1059)  Complications: No complications documented.

## 2020-06-12 ENCOUNTER — Encounter (HOSPITAL_COMMUNITY): Payer: Self-pay | Admitting: Orthopedic Surgery

## 2020-06-12 ENCOUNTER — Telehealth: Payer: Self-pay | Admitting: Orthopedic Surgery

## 2020-06-12 NOTE — Telephone Encounter (Signed)
Please advise 

## 2020-06-12 NOTE — Telephone Encounter (Signed)
noted 

## 2020-06-12 NOTE — Telephone Encounter (Signed)
I called.

## 2020-06-12 NOTE — Telephone Encounter (Signed)
Pt called stating she was not given a report after surgery on 06/11/20 and would like a CB from Dr.Dean to get this  [phone]

## 2020-06-18 ENCOUNTER — Encounter (HOSPITAL_COMMUNITY): Payer: Self-pay | Admitting: Orthopedic Surgery

## 2020-06-19 ENCOUNTER — Ambulatory Visit (INDEPENDENT_AMBULATORY_CARE_PROVIDER_SITE_OTHER): Payer: 59 | Admitting: Surgical

## 2020-06-19 ENCOUNTER — Encounter: Payer: Self-pay | Admitting: Physical Therapy

## 2020-06-19 DIAGNOSIS — Z9889 Other specified postprocedural states: Secondary | ICD-10-CM

## 2020-06-19 MED ORDER — OXYCODONE HCL 5 MG PO TABS: 5.0000 mg | ORAL_TABLET | Freq: Four times a day (QID) | ORAL | 0 refills | Status: DC | PRN

## 2020-06-19 MED ORDER — METHOCARBAMOL 500 MG PO TABS: 500.0000 mg | ORAL_TABLET | Freq: Three times a day (TID) | ORAL | 0 refills | Status: DC | PRN

## 2020-06-22 ENCOUNTER — Other Ambulatory Visit: Payer: Self-pay

## 2020-06-22 ENCOUNTER — Telehealth: Payer: Self-pay | Admitting: Orthopedic Surgery

## 2020-06-22 ENCOUNTER — Encounter: Payer: Self-pay | Admitting: Physical Therapy

## 2020-06-22 ENCOUNTER — Ambulatory Visit: Payer: 59 | Admitting: Physical Therapy

## 2020-06-22 DIAGNOSIS — G8929 Other chronic pain: Secondary | ICD-10-CM

## 2020-06-22 DIAGNOSIS — M6281 Muscle weakness (generalized): Secondary | ICD-10-CM

## 2020-06-22 DIAGNOSIS — R2689 Other abnormalities of gait and mobility: Secondary | ICD-10-CM

## 2020-06-22 DIAGNOSIS — M25562 Pain in left knee: Secondary | ICD-10-CM

## 2020-06-22 DIAGNOSIS — R600 Localized edema: Secondary | ICD-10-CM

## 2020-06-22 DIAGNOSIS — M25662 Stiffness of left knee, not elsewhere classified: Secondary | ICD-10-CM

## 2020-06-22 NOTE — Telephone Encounter (Signed)
I s/w patient Scheduled for Wednesday morning.

## 2020-06-22 NOTE — Telephone Encounter (Signed)
Holding to discuss with [facility]

## 2020-06-22 NOTE — Therapy (Signed)
[facility] Physical Therapy [address] [place], [place], [address] Phone: [phone]   Fax:  [phone]  Physical Therapy Re-evaluation  Patient Details  Name: Danielle Cook MRN: [id number] Date of Birth: 16-May-1976 Referring Provider (PT): August Saucer Corrie Mckusick, MD   Encounter Date: 06/22/2020   PT End of Session - 06/22/20 1150    Visit Number 6    Number of Visits 30    Date for PT Re-Evaluation 08/17/20    PT Start Time 1102    PT Stop Time 1140    PT Time Calculation (min) 38 min    Activity Tolerance Patient tolerated treatment well    Behavior During Therapy [facility] for tasks assessed/performed           Past Medical History:  Diagnosis Date  . Abnormal Pap smear   . Allergy   . Complication of anesthesia   . History of chicken pox   . 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 Hyperlipidemia   . Migraine   . PONV (postoperative nausea and vomiting)   . PPD positive, treated 2004    Past Surgical History:  Procedure Laterality Date  . ANTERIOR CRUCIATE LIGAMENT REPAIR Left 06/11/2020   Procedure: LEFT KNEE ANTERIOR CRUCIATE LIGAMENT (ACL) RECONSTRUCTION WITH WITH QUAD ALLOGRAFT, MENISCAL REPAIR vs DEBRIDEMENT;  Surgeon: Cammy Copa, MD;  Location: [facility] OR;  Service: Orthopedics;  Laterality: Left;  . CHOLECYSTECTOMY, LAPAROSCOPIC  07/13/2000  . COLPOSCOPY  2000,   . TONSILLECTOMY    . WISDOM TOOTH EXTRACTION  1995 Jan    There were no vitals filed for this visit.    Subjective Assessment - 06/22/20 1105    Subjective Pt returns to PT following Lt ACL repair with quad allograft and meniscetomy on 06/11/20.  She presents today with SPC and has given clearance to d/c the brace.    Diagnostic tests 02/29/2020 IMPRESSION:1. Complete tear of the anterior cruciate ligament.2. Small longitudinal tear of the undersurface of the periphery ofthe posterior horn of the medial meniscus.3. Slight sprains of the femoral attachments of the medial andlateral  collateral ligaments.4. Prominent joint effusion.5. 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    Patient Stated Goals get back to work, return to regular activities    Currently in Pain? Yes    Pain Score 4     Pain Location Knee    Pain Orientation Left    Pain Descriptors / Indicators Aching;Throbbing    Pain Type Acute pain;Surgical pain    Pain Onset 1 to 4 weeks ago    Pain Frequency Intermittent    Aggravating Factors  standing, walking, bending    Pain Relieving Factors meds              [facility] PT Assessment - 06/22/20 1109      Assessment   Medical Diagnosis Lt ACL repair    Referring Provider (PT) Cammy Copa, MD    Onset Date/Surgical Date 06/11/20    Hand Dominance Right    Next MD Visit 06/24/20; 07/10/20    Prior Therapy at this clinic before surgery      Precautions   Precautions None      Restrictions   Weight Bearing Restrictions No      Balance Screen   Has the patient fallen in the past 6 months Yes    How many times? 1    Has the patient had a decrease in activity level because of a fear of falling?  No  Is the patient reluctant to leave their home because of a fear of falling?  No      Home Environment   Living Environment Private residence    Living Arrangements Children    Available Help at Discharge Family    Type of Home House    Home Access Stairs to enter    Entrance Stairs-Number of Steps 3    Entrance Stairs-Rails Right    Home Layout One level    Home Equipment [place] - single point      Prior Function   Level of Independence Independent    Vocation Full time employment    Freight forwarder - lifting, carrying, pushing and pulling      Cognition   Overall Cognitive Status Within Functional Limits for tasks assessed      Observation/Other Assessments   Observations increased swelling and bruising noted Lt knee and distal through foot      AROM   Left Knee Extension -1    Left Knee Flexion 91       PROM   Left Knee Flexion 94      Strength   Overall Strength Comments deferred today due to post op status - quad atrophy noted in LLE      Ambulation/Gait   Ambulation/Gait Yes    Ambulation/Gait Assistance 5: Supervision    Ambulation Distance (Feet) 100 Feet    Assistive device Straight cane    Gait Pattern Decreased stance time - left;Decreased step length - right;Decreased hip/knee flexion - left    Ambulation Surface Level;Indoor    Gait velocity decreased -not formally measured today                      Objective measurements completed on examination: See above findings.       [facility] Adult PT Treatment/Exercise - 06/22/20 1109      Knee/Hip Exercises: Aerobic   Recumbent Bike partial revolutions x 5 min      Knee/Hip Exercises: Supine   Quad Sets Strengthening;Left;1 set;10 reps    Heel Slides AAROM;Left;20 reps    Straight Leg Raises Left;10 reps      Knee/Hip Exercises: Sidelying   Hip ABduction Strengthening;Left;10 reps    Hip ADduction 10 reps;Left      Knee/Hip Exercises: Prone   Hip Extension Left;10 reps                  PT Education - 06/22/20 1149    Education Details continue with HEP provided before surgery - working on quad activation and ROM    Person(s) Educated Patient    Methods Explanation;Demonstration    Comprehension Verbalized understanding            PT Short Term Goals - 06/22/20 1155      PT SHORT TERM GOAL #1   Title improve Lt knee AROM 0-100 for improved function    Time 4    Period Weeks    Status New    Target Date 07/20/20      PT SHORT TERM GOAL #2   Title amb without AD independently for improved function    Status New    Target Date 07/20/20      PT SHORT TERM GOAL #3   Title report pain < 2/10 with amb for improved mobility    Status New    Target Date 07/20/20             PT Long Term  Goals - 06/22/20 1156      PT LONG TERM GOAL #1   Title increase Gross LLE strength to >/=  4+/5 to promote knee stability with walking/ standing    Status New    Target Date 08/17/20      PT LONG TERM GOAL #2   Title improve Lt knee AROM 0-120 for improved function and mobility    Time 6    Status New    Target Date 08/17/20      PT LONG TERM GOAL #3   Title pt to be able to perform SLS balance for >/= 30 sec with no report of instability to promote stability    Status New    Target Date 08/17/20      PT LONG TERM GOAL #4   Title pt to be I with all HEP given to maintain and progress current level of function    Status New    Target Date 08/17/20      PT LONG TERM GOAL #5   Title negotiate stairs and gait on various surfaces independently without deviations for imrpoved community access    Status New    Target Date 08/17/20                  Plan - 06/22/20 1150    Clinical Impression Statement Pt returns to PT s/p Lt ACL repair on 06/11/20.  Pt overall doing very well for 11 days post-op, and continues to demonstrate decreased ROM and strength with expected swelling and pain, as well as gait abnormalities affecting functional mobility.  Pt will benefit from PT to address deficits listed.    Personal Factors and Comorbidities Age;Comorbidity 2    Comorbidities migraines, depression    Examination-Activity Limitations Locomotion Level;Transfers;Squat;Stairs;Stand;Lift;Hygiene/Grooming;Dressing;Carry;Caring for Others;Bathing    Examination-Participation Restrictions Meal Prep;Occupation;Cleaning;Driving;[facility] HospitalWork;Laundry    Stability/Clinical Decision Making Stable/Uncomplicated    Clinical Decision Making Low    Rehab Potential Good    PT Frequency 3x / week   2-3x/wk   PT Duration 8 weeks    PT Treatment/Interventions ADLs/Self Care Home Management;Cryotherapy;Electrical Stimulation;Iontophoresis 4mg /ml Dexamethasone;Moist Heat;Ultrasound;Therapeutic activities;Therapeutic exercise;Balance training;Neuromuscular re-education;Patient/family education;Manual  techniques;Dry needling;Taping;Vasopneumatic Device;Passive range of motion;Stair training;Gait training    PT Next Visit Plan continue with current HEP, working on quad activation, BFR, ROM    PT Home Exercise Plan [id number] - SLR 4 way, quad set, heel slides    Consulted and Agree with Plan of Care Patient           Patient will benefit from skilled therapeutic intervention in order to improve the following deficits and impairments:  Abnormal gait, Improper body mechanics, Increased muscle spasms, Decreased strength, Pain, Decreased activity tolerance, Decreased balance, Decreased endurance, Difficulty walking, Decreased range of motion, Decreased mobility, Increased edema  Visit Diagnosis: Chronic pain of left knee - Plan: PT plan of care cert/re-cert  Muscle weakness (generalized) - Plan: PT plan of care cert/re-cert  Other abnormalities of gait and mobility - Plan: PT plan of care cert/re-cert  Localized edema - Plan: PT plan of care cert/re-cert  Stiffness of left knee, not elsewhere classified - Plan: PT plan of care cert/re-cert     Problem List Patient Active Problem List   Diagnosis Date Noted  . 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 Right carpal tunnel syndrome 12/06/2019  . Xerosis cutis 10/16/2019  . Greater trochanteric bursitis of both hips 08/09/2018  . Nicotine vapor product user 08/09/2018  . Erythrocytosis 08/09/2018  .  Greater trochanteric bursitis of right hip 02/06/2017  . Chronic right SI joint pain 02/06/2017  . Visit for screening mammogram 12/13/2016  . Family history of premature CAD 12/13/2016  . Radiculitis of left cervical region 03/23/2016  . 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 Routine general medical examination at a health care facility 03/22/2013  . Allergic rhinitis, cause unspecified 03/22/2013  . Depression with anxiety 10/31/2011      Clarita Crane, PT, DPT 06/22/20 12:00 PM    [facility] Physical  Therapy [address] [place], [place], [address] Phone: [phone]   Fax:  [phone]  Name: Danielle Cook MRN: [id number] Date of Birth: [birth year]

## 2020-06-22 NOTE — Telephone Encounter (Signed)
Patient called. She has PT today at 79. Wanted to know if Franky Macho could work her in before or after to take the fluid off of her knee.

## 2020-06-24 ENCOUNTER — Ambulatory Visit (INDEPENDENT_AMBULATORY_CARE_PROVIDER_SITE_OTHER): Payer: 59 | Admitting: Surgical

## 2020-06-24 DIAGNOSIS — Z9889 Other specified postprocedural states: Secondary | ICD-10-CM

## 2020-06-26 ENCOUNTER — Ambulatory Visit (INDEPENDENT_AMBULATORY_CARE_PROVIDER_SITE_OTHER): Payer: 59 | Admitting: Physical Therapy

## 2020-06-26 ENCOUNTER — Other Ambulatory Visit: Payer: Self-pay

## 2020-06-26 DIAGNOSIS — R2689 Other abnormalities of gait and mobility: Secondary | ICD-10-CM

## 2020-06-26 DIAGNOSIS — M6281 Muscle weakness (generalized): Secondary | ICD-10-CM

## 2020-06-26 DIAGNOSIS — M25562 Pain in left knee: Secondary | ICD-10-CM | POA: Diagnosis not present

## 2020-06-26 DIAGNOSIS — G8929 Other chronic pain: Secondary | ICD-10-CM

## 2020-06-26 DIAGNOSIS — R600 Localized edema: Secondary | ICD-10-CM

## 2020-06-26 DIAGNOSIS — M25662 Stiffness of left knee, not elsewhere classified: Secondary | ICD-10-CM

## 2020-06-26 NOTE — Therapy (Signed)
[facility] Physical Therapy [address] [place], [place], [address] Phone: [phone]   Fax:  [phone]  Physical Therapy Treatment  Patient Details  Name: Danielle Cook MRN: [id number] Date of Birth: [birth year] Referring Provider (PT): August Saucer Corrie Mckusick, MD   Encounter Date: 06/26/2020   PT End of Session - 06/26/20 1159    Visit Number 7    Number of Visits 30    Date for PT Re-Evaluation 08/17/20    PT Start Time 1109    PT Stop Time 1205    PT Time Calculation (min) 56 min    Activity Tolerance Patient tolerated treatment well    Behavior During Therapy [facility] for tasks assessed/performed           Past Medical History:  Diagnosis Date  . Abnormal Pap smear   . Allergy   . Complication of anesthesia   . History of chicken pox   . 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 Hyperlipidemia   . Migraine   . PONV (postoperative nausea and vomiting)   . PPD positive, treated 2004    Past Surgical History:  Procedure Laterality Date  . ANTERIOR CRUCIATE LIGAMENT REPAIR Left 06/11/2020   Procedure: LEFT KNEE ANTERIOR CRUCIATE LIGAMENT (ACL) RECONSTRUCTION WITH WITH QUAD ALLOGRAFT, MENISCAL REPAIR vs DEBRIDEMENT;  Surgeon: Cammy Copa, MD;  Location: [facility] OR;  Service: Orthopedics;  Laterality: Left;  . CHOLECYSTECTOMY, LAPAROSCOPIC  07/13/2000  . COLPOSCOPY  2000,   . TONSILLECTOMY    . WISDOM TOOTH EXTRACTION  1995 Jan    There were no vitals filed for this visit.   Subjective Assessment - 06/26/20 1152    Subjective She relays more pain and swelling today, she had knee drained on wednessday. As a result she is ambulating with SPC    Limitations Standing;Lifting;Walking    How long can you sit comfortably? unlimited    How long can you stand comfortably? unlimited with brace on    How long can you walk comfortably? unlimited with brace on    Diagnostic tests 02/29/2020 IMPRESSION:1. Complete tear of the anterior cruciate ligament.2. Small  longitudinal tear of the undersurface of the periphery ofthe posterior horn of the medial meniscus.3. Slight sprains of the femoral attachments of the medial andlateral collateral ligaments.4. Prominent joint effusion.5. 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    Patient Stated Goals get back to work, return to regular activities    Pain Score 6     Pain Location Knee    Pain Orientation Left    Pain Descriptors / Indicators Aching    Pain Onset 1 to 4 weeks ago             [facility] Adult PT Treatment/Exercise - 06/26/20 0001      Ambulation/Gait   Ambulation/Gait Yes    Ambulation/Gait Assistance 5: Supervision    Ambulation Distance (Feet) 100 Feet    Assistive device Straight cane    Gait Pattern Decreased stance time - left;Decreased step length - right;Decreased hip/knee flexion - left      Exercises   Exercises Knee/Hip;Other Exercises    Other Exercises  BFR: cuff size 4, LOP 300 mmhg in longsitting, worked at 80% 240 mmHG      Knee/Hip Exercises: Aerobic   Nustep L5 X 9 min LE only      Knee/Hip Exercises: Seated   Long Arc Quad Strengthening;Left    Long Arc Quad Limitations with BFR 30/15/15 with 30 sec rest  Knee/Hip Exercises: Supine   Quad Sets Strengthening;Left    Quad Sets Limitations with BFR 30/15/15 with 30 sec rest    Short Arc [employer] Strengthening;Left    Short Arc Quad Sets Limitations with BFR 30/15/15 with 30 sec rest      Modalities   Modalities Vasopneumatic      Vasopneumatic   Number Minutes Vasopneumatic  10 minutes    Vasopnuematic Location  Knee    Vasopneumatic Pressure Medium    Vasopneumatic Temperature  34                    PT Short Term Goals - 06/22/20 1155      PT SHORT TERM GOAL #1   Title improve Lt knee AROM 0-100 for improved function    Time 4    Period Weeks    Status New    Target Date 07/20/20      PT SHORT TERM GOAL #2   Title amb without AD independently for improved function     Status New    Target Date 07/20/20      PT SHORT TERM GOAL #3   Title report pain < 2/10 with amb for improved mobility    Status New    Target Date 07/20/20             PT Long Term Goals - 06/22/20 1156      PT LONG TERM GOAL #1   Title increase Gross LLE strength to >/= 4+/5 to promote knee stability with walking/ standing    Status New    Target Date 08/17/20      PT LONG TERM GOAL #2   Title improve Lt knee AROM 0-120 for improved function and mobility    Time 6    Status New    Target Date 08/17/20      PT LONG TERM GOAL #3   Title pt to be able to perform SLS balance for >/= 30 sec with no report of instability to promote stability    Status New    Target Date 08/17/20      PT LONG TERM GOAL #4   Title pt to be I with all HEP given to maintain and progress current level of function    Status New    Target Date 08/17/20      PT LONG TERM GOAL #5   Title negotiate stairs and gait on various surfaces independently without deviations for imrpoved community access    Status New    Target Date 08/17/20                 Plan - 06/26/20 1200    Clinical Impression Statement Session focused on Lt knee ROM and beginner strengthening using BFR. Some treatment time used for assessing and measuring appropriate cuff size and Limb occlusion pressure. She showed good tolerance to BFR training. Used vaso post session to reduce overall pain and edema noted in her Rt leg. Continue POC    Personal Factors and Comorbidities Age;Comorbidity 2    Comorbidities migraines, depression    Examination-Activity Limitations Locomotion Level;Transfers;Squat;Stairs;Stand;Lift;Hygiene/Grooming;Dressing;Carry;Caring for Others;Bathing    Examination-Participation Restrictions Meal Prep;Occupation;Cleaning;Driving;[facility]adder [facility]    Stability/Clinical Decision Making Stable/Uncomplicated    Rehab Potential Good    PT Frequency 3x / week   2-3x/wk   PT Duration 8 weeks    PT  Treatment/Interventions ADLs/Self Care Home Management;Cryotherapy;Electrical Stimulation;Iontophoresis 4mg /ml Dexamethasone;Moist Heat;Ultrasound;Therapeutic activities;Therapeutic exercise;Balance training;Neuromuscular re-education;Patient/family education;Manual techniques;Dry needling;Taping;Vasopneumatic Device;Passive range  of motion;Stair training;Gait training    PT Next Visit Plan continue with current HEP, working on quad activation, BFR, ROM    PT Home Exercise Plan [id number] - SLR 4 way, quad set, heel slides    Consulted and Agree with Plan of Care Patient           Patient will benefit from skilled therapeutic intervention in order to improve the following deficits and impairments:  Abnormal gait, Improper body mechanics, Increased muscle spasms, Decreased strength, Pain, Decreased activity tolerance, Decreased balance, Decreased endurance, Difficulty walking, Decreased range of motion, Decreased mobility, Increased edema  Visit Diagnosis: Chronic pain of left knee  Muscle weakness (generalized)  Other abnormalities of gait and mobility  Localized edema  Stiffness of left knee, not elsewhere classified     Problem List Patient Active Problem List   Diagnosis Date Noted  . 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 Right carpal tunnel syndrome 12/06/2019  . Xerosis cutis 10/16/2019  . Greater trochanteric bursitis of both hips 08/09/2018  . Nicotine vapor product user 08/09/2018  . Erythrocytosis 08/09/2018  . Greater trochanteric bursitis of right hip 02/06/2017  . Chronic right SI joint pain 02/06/2017  . Visit for screening mammogram 12/13/2016  . Family history of premature CAD 12/13/2016  . Radiculitis of left cervical region 03/23/2016  . 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 Routine general medical examination at a health care facility 03/22/2013  . Allergic rhinitis, cause unspecified 03/22/2013  . Depression with anxiety 10/31/2011    Birdie Riddle 06/26/2020, 12:02 PM  [facility] Physical Therapy [address] [place], [place], [address] Phone: [phone]   Fax:  [phone]  Name: Danielle Cook MRN: [id number] Date of Birth: 1976/05/25

## 2020-06-28 ENCOUNTER — Encounter: Payer: Self-pay | Admitting: Surgical

## 2020-06-28 NOTE — Progress Notes (Signed)
Post-Op Visit Note   Patient: Danielle Cook           Date of Birth: 21-Sep-1976           MRN: [id number] Visit Date: 06/19/2020 PCP: Etta Grandchild, MD   Assessment & Plan:  Chief Complaint:  Chief Complaint  Patient presents with  . Left Knee - Routine Post Op   Visit Diagnoses:  1. S/P left knee arthroscopy     Plan: Patient is a 44 year old female who presents s/p left knee ACL reconstruction with quad allograft on 06/11/2020.  She is doing well overall.  Up to 80 degrees on CPM machine.  She is using CPM machine 3 times per day.  Pain is not waking her up at night.  She takes Tylenol as well as oxycodone every 4 hours.  She is compliant with taking aspirin for DVT prophylaxis.  She has 0 to 5 degrees of extension on exam with 80 degrees of flexion.  Graft is stable.  She is weightbearing as tolerated with a walker.  She does have a large knee effusion but patient does not wish for knee aspiration today.  She takes meloxicam additionally on occasion.  No calf tenderness on exam.  Negative Homans' sign.  Plan to refill oxycodone and muscle relaxer today.  She will start outpatient physical therapy upstairs 2 times per week.  Follow-up in 3 weeks for clinical recheck.  Patient agreed with plan.  Follow-Up Instructions: No follow-ups on file.   Orders:  Orders Placed This Encounter  Procedures  . Ambulatory referral to Physical Therapy   Meds ordered this encounter  Medications  . methocarbamol (ROBAXIN) 500 MG tablet    Sig: Take 1 tablet (500 mg total) by mouth every 8 (eight) hours as needed for muscle spasms.    Dispense:  30 tablet    Refill:  0  . oxyCODONE (OXY IR/ROXICODONE) 5 MG immediate release tablet    Sig: Take 1 tablet (5 mg total) by mouth every 6 (six) hours as needed for severe pain.    Dispense:  30 tablet    Refill:  0    Imaging: No results found.  PMFS History: Patient Active Problem List   Diagnosis Date Noted  . Chronic rupture of ACL of  left knee 04/24/2020  . Right carpal tunnel syndrome 12/06/2019  . Xerosis cutis 10/16/2019  . Greater trochanteric bursitis of both hips 08/09/2018  . Nicotine vapor product user 08/09/2018  . Erythrocytosis 08/09/2018  . Greater trochanteric bursitis of right hip 02/06/2017  . Chronic right SI joint pain 02/06/2017  . Visit for screening mammogram 12/13/2016  . Family history of premature CAD 12/13/2016  . Radiculitis of left cervical region 03/23/2016  . 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 Routine general medical examination at a health care facility 03/22/2013  . Allergic rhinitis, cause unspecified 03/22/2013  . Depression with anxiety 10/31/2011   Past Medical History:  Diagnosis Date  . Abnormal Pap smear   . Allergy   . Complication of anesthesia   . History of chicken pox   . 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 Hyperlipidemia   . Migraine   . PONV (postoperative nausea and vomiting)   . PPD positive, treated 2004    Family History  Problem Relation Age of Onset  . Stroke Mother   . Dementia Mother   . Hyperlipidemia Mother   . Early death Father   . Heart disease Father   .  Hyperlipidemia Father   . Cancer Neg Hx   . Depression Neg Hx   . Diabetes Neg Hx   . Drug abuse Neg Hx   . Hearing loss Neg Hx   . Hypertension Neg Hx     Past Surgical History:  Procedure Laterality Date  . ANTERIOR CRUCIATE LIGAMENT REPAIR Left 06/11/2020   Procedure: LEFT KNEE ANTERIOR CRUCIATE LIGAMENT (ACL) RECONSTRUCTION WITH WITH QUAD ALLOGRAFT, MENISCAL REPAIR vs DEBRIDEMENT;  Surgeon: Cammy Copa, MD;  Location: [facility] OR;  Service: Orthopedics;  Laterality: Left;  . CHOLECYSTECTOMY, LAPAROSCOPIC  07/13/2000  . COLPOSCOPY  2000,   . TONSILLECTOMY    . WISDOM TOOTH EXTRACTION  1995 Jan   Social History   Occupational History  . Not on file  Tobacco Use  . Smoking status: Current Every Day Smoker    Packs/day: 0.25    Years: 15.00    Pack years: 3.75    Types: E-cigarettes  .  Smokeless tobacco: Never Used  Substance and Sexual Activity  . Alcohol use: No    Comment: few glasses of wine during pregnancy  . Drug use: No  . Sexual activity: Yes    Partners: Male

## 2020-06-28 NOTE — Progress Notes (Signed)
Post-Op Visit Note   Patient: Danielle Cook           Date of Birth: 1976/05/18           MRN: [id number] Visit Date: 06/24/2020 PCP: Etta Grandchild, MD   Assessment & Plan:  Chief Complaint:  Chief Complaint  Patient presents with  . possible knee aspiration   Visit Diagnoses:  1. S/P left knee arthroscopy     Plan: Patient is a 44 year old female presents s/p left knee ACL reconstruction on 06/11/2020.  She presents today for knee aspiration.  She had a large postop effusion at her last office visit that she denied aspiration for last time.  She now feels that it would improve her range of motion and pain to have it aspirated.  Today she has a smaller effusion compared with her prior office visit.  She has 0 degrees of extension and 110 degrees of flexion.  12 cc was aspirated successfully from the knee joint.  She states that it seemed to decompress her knee and improve her pain.  Follow-up at next office visit as planned.  Patient agreed with plan.  Graft is still stable today.  No calf tenderness.  Negative Homans' sign.  Follow-Up Instructions: No follow-ups on file.   Orders:  No orders of the defined types were placed in this encounter.  No orders of the defined types were placed in this encounter.   Imaging: No results found.  PMFS History: Patient Active Problem List   Diagnosis Date Noted  . 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 Right carpal tunnel syndrome 12/06/2019  . Xerosis cutis 10/16/2019  . Greater trochanteric bursitis of both hips 08/09/2018  . Nicotine vapor product user 08/09/2018  . Erythrocytosis 08/09/2018  . Greater trochanteric bursitis of right hip 02/06/2017  . Chronic right SI joint pain 02/06/2017  . Visit for screening mammogram 12/13/2016  . Family history of premature CAD 12/13/2016  . Radiculitis of left cervical region 03/23/2016  . 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 Routine general medical examination at a health  care facility 03/22/2013  . Allergic rhinitis, cause unspecified 03/22/2013  . Depression with anxiety 10/31/2011   Past Medical History:  Diagnosis Date  . Abnormal Pap smear   . Allergy   . Complication of anesthesia   . History of chicken pox   . 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 Hyperlipidemia   . Migraine   . PONV (postoperative nausea and vomiting)   . PPD positive, treated 2004    Family History  Problem Relation Age of Onset  . Stroke Mother   . Dementia Mother   . Hyperlipidemia Mother   . Early death Father   . Heart disease Father   . Hyperlipidemia Father   . Cancer Neg Hx   . Depression Neg Hx   . Diabetes Neg Hx   . Drug abuse Neg Hx   . Hearing loss Neg Hx   . Hypertension Neg Hx     Past Surgical History:  Procedure Laterality Date  . ANTERIOR CRUCIATE LIGAMENT REPAIR Left 06/11/2020   Procedure: LEFT KNEE ANTERIOR CRUCIATE LIGAMENT (ACL) RECONSTRUCTION WITH WITH QUAD ALLOGRAFT, MENISCAL REPAIR vs DEBRIDEMENT;  Surgeon: Cammy Copa, MD;  Location: [facility] OR;  Service: Orthopedics;  Laterality: Left;  . CHOLECYSTECTOMY, LAPAROSCOPIC  07/13/2000  . COLPOSCOPY  2000,   . TONSILLECTOMY    . WISDOM TOOTH EXTRACTION  1995 Jan   Social  History   Occupational History  . Not on file  Tobacco Use  . Smoking status: Current Every Day Smoker    Packs/day: 0.25    Years: 15.00    Pack years: 3.75    Types: E-cigarettes  . Smokeless tobacco: Never Used  Substance and Sexual Activity  . Alcohol use: No    Comment: few glasses of wine during pregnancy  . Drug use: No  . Sexual activity: Yes    Partners: Male

## 2020-06-29 ENCOUNTER — Ambulatory Visit (INDEPENDENT_AMBULATORY_CARE_PROVIDER_SITE_OTHER): Payer: 59 | Admitting: Physical Therapy

## 2020-06-29 ENCOUNTER — Other Ambulatory Visit: Payer: Self-pay

## 2020-06-29 ENCOUNTER — Encounter: Payer: Self-pay | Admitting: Physical Therapy

## 2020-06-29 DIAGNOSIS — G8929 Other chronic pain: Secondary | ICD-10-CM

## 2020-06-29 DIAGNOSIS — R600 Localized edema: Secondary | ICD-10-CM

## 2020-06-29 DIAGNOSIS — M25662 Stiffness of left knee, not elsewhere classified: Secondary | ICD-10-CM

## 2020-06-29 DIAGNOSIS — R2689 Other abnormalities of gait and mobility: Secondary | ICD-10-CM | POA: Diagnosis not present

## 2020-06-29 DIAGNOSIS — M25562 Pain in left knee: Secondary | ICD-10-CM | POA: Diagnosis not present

## 2020-06-29 DIAGNOSIS — M6281 Muscle weakness (generalized): Secondary | ICD-10-CM

## 2020-06-29 NOTE — Therapy (Signed)
[facility] [address] [place], [place], [address] Phone: [phone]   Fax:  [phone]  Physical Therapy Treatment  Patient Details  Name: Danielle Cook MRN: [id number] Date of Birth: [birth year] Referring Provider (PT): Cammy Copa, MD   Encounter Date: 06/29/2020   PT End of Session - 06/29/20 1151    Visit Number 8    Number of Visits 30    Date for PT Re-Evaluation 08/17/20    PT Start Time 1115    PT Stop Time 1208    PT Time Calculation (min) 53 min    Activity Tolerance Patient tolerated treatment well    Behavior During Therapy [facility] for tasks assessed/performed           Past Medical History:  Diagnosis Date   Abnormal Pap smear    Allergy    Complication of anesthesia    History of chicken pox    Hx of TB skin testing    Hyperlipidemia    Migraine    PONV (postoperative nausea and vomiting)    PPD positive, treated 2004    Past Surgical History:  Procedure Laterality Date   ANTERIOR CRUCIATE LIGAMENT REPAIR Left 06/11/2020   Procedure: LEFT KNEE ANTERIOR CRUCIATE LIGAMENT (ACL) RECONSTRUCTION WITH WITH QUAD ALLOGRAFT, MENISCAL REPAIR vs DEBRIDEMENT;  Surgeon: Cammy Copa, MD;  Location: [facility] OR;  Service: Orthopedics;  Laterality: Left;   CHOLECYSTECTOMY, LAPAROSCOPIC  07/13/2000   COLPOSCOPY  2000,    TONSILLECTOMY     WISDOM TOOTH EXTRACTION  1995 Jan    There were no vitals filed for this visit.   Subjective Assessment - 06/29/20 1116    Subjective She has been doing her exercises.    Limitations Standing;Lifting;Walking    How long can you sit comfortably? unlimited    How long can you stand comfortably? unlimited with brace on    How long can you walk comfortably? unlimited with brace on    Diagnostic tests 02/29/2020 IMPRESSION:1. Complete tear of the anterior cruciate ligament.2. Small longitudinal tear of the undersurface of the periphery ofthe posterior horn of the medial  meniscus.3. Slight sprains of the femoral attachments of the medial andlateral collateral ligaments.4. Prominent joint effusion.5. 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    Patient Stated Goals get back to work, return to regular activities    Currently in Pain? Yes    Pain Score 3    this morning 2-3, Sat was 6-7/10 & Wynelle Link was 4-5/10   Pain Location Knee    Pain Orientation Left    Pain Descriptors / Indicators Aching;Sore;Sharp    Pain Type Chronic pain    Pain Onset 1 to 4 weeks ago    Pain Frequency Intermittent    Aggravating Factors  standing & walking, worse in evening    Pain Relieving Factors prescription medications.                             [facility] Adult PT Treatment/Exercise - 06/29/20 1115      Ambulation/Gait   Ambulation/Gait Yes    Ambulation/Gait Assistance 5: Supervision    Ambulation Distance (Feet) 100 Feet    Assistive device None    Gait Pattern Decreased stance time - left;Decreased step length - right;Decreased hip/knee flexion - left    Ambulation Surface Level;Indoor      Exercises   Exercises Knee/Hip;Other Exercises    Other Exercises  BFR: cuff size  4, LOP 300 mmhg in longsitting, worked at 80% 240 mmHG      Knee/Hip Exercises: Stretches   Theme park manager Left;3 reps;20 seconds    Gastroc Stretch Limitations 1st rep slant board, 2nd & 3rd rep heel off step      Knee/Hip Exercises: Aerobic   Recumbent Bike seat 5 level 1.0 for 9 minutes with increased fluency of motion.     Nustep --      Knee/Hip Exercises: Machines for Strengthening   Cybex Knee Extension Batca LLE with BFR 5# 30 reps/15 reps 3 sets  Using arm to for full range.      Cybex Knee Flexion BAtca LLE with BFR 15# 30 reps, 15 reps 3 sets with 30 sec rest between sets.       Knee/Hip Exercises: Seated   Long Arc Quad --    Long Arc [employer] Limitations --      Knee/Hip Exercises: Supine   Quad Sets Strengthening;Left;10 reps;1 set   5 sec hold   Quad  Sets Limitations lower leg supported on towel roll to facilitate increased knee extension    Short Arc [employer] --      Modalities   Modalities Vasopneumatic      Vasopneumatic   Number Minutes Vasopneumatic  10 minutes    Vasopnuematic Location  Knee    Vasopneumatic Pressure High    Vasopneumatic Temperature  34                  PT Education - 06/29/20 1154    Education Details PT discussed over the counter NSAIDs vs prescription for pain management as she wants to wean off narcotics    Person(s) Educated Patient    Methods Explanation;Verbal cues    Comprehension Verbalized understanding            PT Short Term Goals - 06/22/20 1155      PT SHORT TERM GOAL #1   Title improve Lt knee AROM 0-100 for improved function    Time 4    Period Weeks    Status New    Target Date 07/20/20      PT SHORT TERM GOAL #2   Title amb without AD independently for improved function    Status New    Target Date 07/20/20      PT SHORT TERM GOAL #3   Title report pain < 2/10 with amb for improved mobility    Status New    Target Date 07/20/20             PT Long Term Goals - 06/22/20 1156      PT LONG TERM GOAL #1   Title increase Gross LLE strength to >/= 4+/5 to promote knee stability with walking/ standing    Status New    Target Date 08/17/20      PT LONG TERM GOAL #2   Title improve Lt knee AROM 0-120 for improved function and mobility    Time 6    Status New    Target Date 08/17/20      PT LONG TERM GOAL #3   Title pt to be able to perform SLS balance for >/= 30 sec with no report of instability to promote stability    Status New    Target Date 08/17/20      PT LONG TERM GOAL #4   Title pt to be I with all HEP given to maintain and progress current level of function  Status New    Target Date 08/17/20      PT LONG TERM GOAL #5   Title negotiate stairs and gait on various surfaces independently without deviations for imrpoved community access     Status New    Target Date 08/17/20                 Plan - 06/29/20 1152    Clinical Impression Statement PT used low weights with Blood Flow Restriction Therapy for knee extension & flexion for strengthening.  Patient reports BFR is very uncomfortable but understands the benefits.    Personal Factors and Comorbidities Age;Comorbidity 2    Comorbidities migraines, depression    Examination-Activity Limitations Locomotion Level;Transfers;Squat;Stairs;Stand;Lift;Hygiene/Grooming;Dressing;Carry;Caring for Others;Bathing    Examination-Participation Restrictions Meal Prep;Occupation;Cleaning;Driving;Pincus Badder [facility]    Stability/Clinical Decision Making Stable/Uncomplicated    Rehab Potential Good    PT Frequency 3x / week   2-3x/wk   PT Duration 8 weeks    PT Treatment/Interventions ADLs/Self Care Home Management;Cryotherapy;Electrical Stimulation;Iontophoresis 4mg /ml Dexamethasone;Moist Heat;Ultrasound;Therapeutic activities;Therapeutic exercise;Balance training;Neuromuscular re-education;Patient/family education;Manual techniques;Dry needling;Taping;Vasopneumatic Device;Passive range of motion;Stair training;Gait training    PT Next Visit Plan continue with current HEP, working on quad activation, BFR, ROM    PT Home Exercise Plan [id number] - SLR 4 way, quad set, heel slides    Consulted and Agree with Plan of Care Patient           Patient will benefit from skilled therapeutic intervention in order to improve the following deficits and impairments:  Abnormal gait, Improper body mechanics, Increased muscle spasms, Decreased strength, Pain, Decreased activity tolerance, Decreased balance, Decreased endurance, Difficulty walking, Decreased range of motion, Decreased mobility, Increased edema  Visit Diagnosis: Chronic pain of left knee  Muscle weakness (generalized)  Other abnormalities of gait and mobility  Localized edema  Stiffness of left knee, not elsewhere  classified     Problem List Patient Active Problem List   Diagnosis Date Noted   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 04/24/2020   Right carpal tunnel syndrome 12/06/2019   Xerosis cutis 10/16/2019   Greater trochanteric bursitis of both hips 08/09/2018   Nicotine vapor product user 08/09/2018   Erythrocytosis 08/09/2018   Greater trochanteric bursitis of right hip 02/06/2017   Chronic right SI joint pain 02/06/2017   Visit for screening mammogram 12/13/2016   Family history of premature CAD 12/13/2016   Radiculitis of left cervical region 03/23/2016   PPD positive, treated 03/22/2013   Routine general medical examination at a health care facility 03/22/2013   Allergic rhinitis, cause unspecified 03/22/2013   Depression with anxiety 10/31/2011    11/02/2011 PT, DPT 06/29/2020, 12:07 PM  [facility] Physical Therapy [address] [place], [place], [place] Phone: [phone]   Fax:  [phone]  Name: Danielle Cook MRN: Garrel Ridgel Date of Birth: 01-Oct-1976

## 2020-07-01 ENCOUNTER — Encounter: Payer: Self-pay | Admitting: Physical Therapy

## 2020-07-01 ENCOUNTER — Other Ambulatory Visit: Payer: Self-pay

## 2020-07-01 ENCOUNTER — Ambulatory Visit: Payer: 59 | Admitting: Physical Therapy

## 2020-07-01 DIAGNOSIS — R600 Localized edema: Secondary | ICD-10-CM | POA: Diagnosis not present

## 2020-07-01 DIAGNOSIS — R2689 Other abnormalities of gait and mobility: Secondary | ICD-10-CM | POA: Diagnosis not present

## 2020-07-01 DIAGNOSIS — M25662 Stiffness of left knee, not elsewhere classified: Secondary | ICD-10-CM

## 2020-07-01 DIAGNOSIS — M25562 Pain in left knee: Secondary | ICD-10-CM | POA: Diagnosis not present

## 2020-07-01 DIAGNOSIS — M6281 Muscle weakness (generalized): Secondary | ICD-10-CM

## 2020-07-01 DIAGNOSIS — G8929 Other chronic pain: Secondary | ICD-10-CM

## 2020-07-01 NOTE — Therapy (Signed)
[facility] [address] [place], [place], [address] Phone: [phone]   Fax:  [phone]  Physical Therapy Treatment  Patient Details  Name: Danielle Cook MRN: [id number] Date of Birth: [birth year] Referring Provider (PT): August Saucer Corrie Mckusick, MD   Encounter Date: 07/01/2020   PT End of Session - 07/01/20 1023    Visit Number 9    Number of Visits 30    Date for PT Re-Evaluation 08/17/20    PT Start Time 1016    PT Stop Time 1115    PT Time Calculation (min) 59 min    Activity Tolerance Patient tolerated treatment well    Behavior During Therapy [facility] for tasks assessed/performed           Past Medical History:  Diagnosis Date  . Abnormal Pap smear   . Allergy   . Complication of anesthesia   . History of chicken pox   . 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 Hyperlipidemia   . Migraine   . PONV (postoperative nausea and vomiting)   . PPD positive, treated 2004    Past Surgical History:  Procedure Laterality Date  . ANTERIOR CRUCIATE LIGAMENT REPAIR Left 06/11/2020   Procedure: LEFT KNEE ANTERIOR CRUCIATE LIGAMENT (ACL) RECONSTRUCTION WITH WITH QUAD ALLOGRAFT, MENISCAL REPAIR vs DEBRIDEMENT;  Surgeon: Cammy Copa, MD;  Location: [facility] OR;  Service: Orthopedics;  Laterality: Left;  . CHOLECYSTECTOMY, LAPAROSCOPIC  07/13/2000  . COLPOSCOPY  2000,   . TONSILLECTOMY    . WISDOM TOOTH EXTRACTION  1995 Jan    There were no vitals filed for this visit.   Subjective Assessment - 07/01/20 1016    Subjective She is using CPM 3 hours / day. She is doing her exercises.    Limitations Standing;Lifting;Walking    How long can you sit comfortably? unlimited    How long can you stand comfortably? unlimited with brace on    How long can you walk comfortably? unlimited with brace on    Diagnostic tests 02/29/2020 IMPRESSION:1. Complete tear of the anterior cruciate ligament.2. Small longitudinal tear of the undersurface of the periphery ofthe  posterior horn of the medial meniscus.3. Slight sprains of the femoral attachments of the medial andlateral collateral ligaments.4. Prominent joint effusion.5. 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    Patient Stated Goals get back to work, return to regular activities    Currently in Pain? Yes    Pain Score 2     Pain Location Knee    Pain Orientation Left    Pain Descriptors / Indicators Aching;Sore    Pain Onset 1 to 4 weeks ago    Pain Frequency Constant    Aggravating Factors  standing    Pain Relieving Factors ice & rest                             [facility] Adult PT Treatment/Exercise - 07/01/20 1016      Ambulation/Gait   Ambulation/Gait Yes    Ambulation/Gait Assistance 5: Supervision    Ambulation Distance (Feet) 100 Feet    Assistive device Straight cane    Gait Pattern Decreased stance time - left;Decreased step length - right;Decreased hip/knee flexion - left      Exercises   Exercises Knee/Hip;Other Exercises    Other Exercises  BFR: cuff size 4, LOP 300 mmhg in longsitting, worked at 60% 180 mmHG      Knee/Hip Exercises: Aerobic  Recumbent Bike seat 5 level 1.0 for 10 minutes with increased fluency of motion.     Nustep --      Knee/Hip Exercises: Seated   Long Arc Quad --    Long [employer] Limitations --      Knee/Hip Exercises: Supine   [employer] Limitations with BFR 30/15/15 with 30 sec rest    Short Arc [employer] Strengthening;Left    Straight Leg Raises Strengthening;Left    Straight Leg Raises Limitations with BFR 30 reps, /15/15/15 reps with 30 sec rest between    Knee Flexion AROM;AAROM;Left;10 reps    Knee Flexion Limitations AAROM 106* flexion to -2* extension.  knee to chest rolling 55cm ball with strap assistance 15 sec hold 10 reps.       Modalities   Modalities Vasopneumatic      Vasopneumatic   Number Minutes Vasopneumatic  15 minutes    Vasopnuematic Location  Knee     Vasopneumatic Pressure High    Vasopneumatic Temperature  34                    PT Short Term Goals - 06/22/20 1155      PT SHORT TERM GOAL #1   Title improve Lt knee AROM 0-100 for improved function    Time 4    Period Weeks    Status New    Target Date 07/20/20      PT SHORT TERM GOAL #2   Title amb without AD independently for improved function    Status New    Target Date 07/20/20      PT SHORT TERM GOAL #3   Title report pain < 2/10 with amb for improved mobility    Status New    Target Date 07/20/20             PT Long Term Goals - 06/22/20 1156      PT LONG TERM GOAL #1   Title increase Gross LLE strength to >/= 4+/5 to promote knee stability with walking/ standing    Status New    Target Date 08/17/20      PT LONG TERM GOAL #2   Title improve Lt knee AROM 0-120 for improved function and mobility    Time 6    Status New    Target Date 08/17/20      PT LONG TERM GOAL #3   Title pt to be able to perform SLS balance for >/= 30 sec with no report of instability to promote stability    Status New    Target Date 08/17/20      PT LONG TERM GOAL #4   Title pt to be I with all HEP given to maintain and progress current level of function    Status New    Target Date 08/17/20      PT LONG TERM GOAL #5   Title negotiate stairs and gait on various surfaces independently without deviations for imrpoved community access    Status New    Target Date 08/17/20                 Plan - 07/01/20 1023    Clinical Impression Statement Patient tolerated Blood Flow Restriction at 60% better than 80%.  Her PROM improved to -2* extension to 106* flexion.    Personal Factors and Comorbidities Age;Comorbidity 2    Comorbidities migraines, depression    Examination-Activity Limitations Locomotion Level;Transfers;Squat;Stairs;Stand;Lift;Hygiene/Grooming;Dressing;Carry;Caring for  Others;Bathing    Examination-Participation Restrictions Meal  Prep;Occupation;Cleaning;Driving;Pincus Badder [facility]    Stability/Clinical Decision Making Stable/Uncomplicated    Rehab Potential Good    PT Frequency 3x / week   2-3x/wk   PT Duration 8 weeks    PT Treatment/Interventions ADLs/Self Care Home Management;Cryotherapy;Electrical Stimulation;Iontophoresis 4mg /ml Dexamethasone;Moist Heat;Ultrasound;Therapeutic activities;Therapeutic exercise;Balance training;Neuromuscular re-education;Patient/family education;Manual techniques;Dry needling;Taping;Vasopneumatic Device;Passive range of motion;Stair training;Gait training    PT Next Visit Plan continue with current HEP, working on quad activation, BFR at 60%, ROM    PT Home Exercise Plan [id number] - SLR 4 way, quad set, heel slides    Consulted and Agree with Plan of Care Patient           Patient will benefit from skilled therapeutic intervention in order to improve the following deficits and impairments:  Abnormal gait, Improper body mechanics, Increased muscle spasms, Decreased strength, Pain, Decreased activity tolerance, Decreased balance, Decreased endurance, Difficulty walking, Decreased range of motion, Decreased mobility, Increased edema  Visit Diagnosis: Chronic pain of left knee  Muscle weakness (generalized)  Other abnormalities of gait and mobility  Localized edema  Stiffness of left knee, not elsewhere classified     Problem List Patient Active Problem List   Diagnosis Date Noted  . 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 Right carpal tunnel syndrome 12/06/2019  . Xerosis cutis 10/16/2019  . Greater trochanteric bursitis of both hips 08/09/2018  . Nicotine vapor product user 08/09/2018  . Erythrocytosis 08/09/2018  . Greater trochanteric bursitis of right hip 02/06/2017  . Chronic right SI joint pain 02/06/2017  . Visit for screening mammogram 12/13/2016  . Family history of premature CAD 12/13/2016  . Radiculitis of left cervical region 03/23/2016  . PPD  positive, treated 03/22/2013  . Routine general medical examination at a health care facility 03/22/2013  . Allergic rhinitis, cause unspecified 03/22/2013  . Depression with anxiety 10/31/2011    11/02/2011 PT, DPT 07/01/2020, 12:09 PM  [facility] [address] [place], [place], [place] Phone: [phone]   Fax:  [phone]  Name: ALCIE RUNIONS MRN: Garrel Ridgel Date of Birth: 27-Apr-1976

## 2020-07-03 ENCOUNTER — Other Ambulatory Visit: Payer: Self-pay | Admitting: Orthopedic Surgery

## 2020-07-03 NOTE — Telephone Encounter (Signed)
Please advise 

## 2020-07-08 ENCOUNTER — Ambulatory Visit: Payer: 59 | Admitting: Physical Therapy

## 2020-07-08 ENCOUNTER — Encounter: Payer: Self-pay | Admitting: Physical Therapy

## 2020-07-08 ENCOUNTER — Other Ambulatory Visit: Payer: Self-pay

## 2020-07-08 DIAGNOSIS — R2689 Other abnormalities of gait and mobility: Secondary | ICD-10-CM

## 2020-07-08 DIAGNOSIS — R600 Localized edema: Secondary | ICD-10-CM

## 2020-07-08 DIAGNOSIS — M25562 Pain in left knee: Secondary | ICD-10-CM | POA: Diagnosis not present

## 2020-07-08 DIAGNOSIS — M6281 Muscle weakness (generalized): Secondary | ICD-10-CM

## 2020-07-08 DIAGNOSIS — M25662 Stiffness of left knee, not elsewhere classified: Secondary | ICD-10-CM

## 2020-07-08 DIAGNOSIS — G8929 Other chronic pain: Secondary | ICD-10-CM

## 2020-07-08 NOTE — Therapy (Signed)
[facility] Physical Therapy [address] [place], [place], [address] Phone: [phone]   Fax:  [phone]  Physical Therapy Treatment/MD progress note  Patient Details  Name: Danielle Cook MRN: [id number] Date of Birth: December 18, 1975 Referring Provider (PT): August Saucer Corrie Mckusick, MD   Encounter Date: 07/08/2020   PT End of Session - 07/08/20 1647    Visit Number 10    Number of Visits 30    Date for PT Re-Evaluation 08/17/20    PT Start Time 0400    PT Stop Time 0450    PT Time Calculation (min) 50 min    Activity Tolerance Patient tolerated treatment well    Behavior During Therapy [facility] for tasks assessed/performed           Past Medical History:  Diagnosis Date  . Abnormal Pap smear   . Allergy   . Complication of anesthesia   . History of chicken pox   . 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 Hyperlipidemia   . Migraine   . PONV (postoperative nausea and vomiting)   . PPD positive, treated 2004    Past Surgical History:  Procedure Laterality Date  . ANTERIOR CRUCIATE LIGAMENT REPAIR Left 06/11/2020   Procedure: LEFT KNEE ANTERIOR CRUCIATE LIGAMENT (ACL) RECONSTRUCTION WITH WITH QUAD ALLOGRAFT, MENISCAL REPAIR vs DEBRIDEMENT;  Surgeon: Cammy Copa, MD;  Location: [facility] OR;  Service: Orthopedics;  Laterality: Left;  . CHOLECYSTECTOMY, LAPAROSCOPIC  07/13/2000  . COLPOSCOPY  2000,   . TONSILLECTOMY    . WISDOM TOOTH EXTRACTION  1995 Jan    There were no vitals filed for this visit.   Subjective Assessment - 07/08/20 1638    Subjective not much pain, just can tell her Lt knee is more stiff and weak than her Rt    Limitations Standing;Lifting;Walking    How long can you sit comfortably? unlimited    How long can you stand comfortably? unlimited with brace on    How long can you walk comfortably? unlimited with brace on    Diagnostic tests 02/29/2020 IMPRESSION:1. Complete tear of the anterior cruciate ligament.2. Small longitudinal tear of the  undersurface of the periphery ofthe posterior horn of the medial meniscus.3. Slight sprains of the femoral attachments of the medial andlateral collateral ligaments.4. Prominent joint effusion.5. 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    Patient Stated Goals get back to work, return to regular activities    Pain Onset 1 to 4 weeks ago              [facility] PT Assessment - 07/08/20 0001      Assessment   Medical Diagnosis Lt ACL repair    Referring Provider (PT) August Saucer Corrie Mckusick, MD    Onset Date/Surgical Date 06/11/20      AROM   Left Knee Extension 0    Left Knee Flexion 122      Strength   Overall Strength Comments Functional strength assessment using single leg reach. Rt leg 14.5 inch, Lt leg 11.5 inch =79%    Left Hip Flexion 5/5    Left Hip ABduction 5/5    Left Knee Flexion 4+/5    Left Knee Extension 4+/5                         [facility] Adult PT Treatment/Exercise - 07/08/20 0001      Knee/Hip Exercises: Stretches   Active Hamstring Stretch 3 reps;Left;30 seconds    Lobbyist  Left;3 reps;60 seconds    Quad Stretch Limitations prone with strap    Other Knee/Hip Stretches quadriped knee flexion stretch 30 sec X 2      Knee/Hip Exercises: Aerobic   Recumbent Bike seat 5 level 1.0 for 10 minutes with increased fluency of motion.       Knee/Hip Exercises: Machines for Strengthening   Cybex Knee Extension up with both, down with Lt only 10 lbs 2X10    Cybex Knee Flexion Lt leg only 25 lbs 2X15    Total Gym Leg Press 100 lbs bilat X 30 reps, SL 75 lbs 3X10 on ea side                    PT Short Term Goals - 07/08/20 1651      PT SHORT TERM GOAL #1   Title improve Lt knee AROM 0-100 for improved function    Time 4    Period Weeks    Status Achieved    Target Date 07/20/20      PT SHORT TERM GOAL #2   Title amb without AD independently for improved function    Status Achieved    Target Date 07/20/20      PT SHORT TERM  GOAL #3   Title report pain < 2/10 with amb for improved mobility    Status Achieved    Target Date 07/20/20             PT Long Term Goals - 07/08/20 1651      PT LONG TERM GOAL #1   Title increase Gross LLE strength to >/= 4+/5 to promote knee stability with walking/ standing    Status On-going      PT LONG TERM GOAL #2   Title improve Lt knee AROM 0-120 for improved function and mobility    Time 6    Status Achieved      PT LONG TERM GOAL #3   Title pt to be able to perform SLS balance for >/= 30 sec with no report of instability to promote stability    Status On-going      PT LONG TERM GOAL #4   Title pt to be I with all HEP given to maintain and progress current level of function    Status On-going      PT LONG TERM GOAL #5   Title negotiate stairs and gait on various surfaces independently without deviations for imrpoved community access    Status On-going                 Plan - 07/08/20 1649    Clinical Impression Statement MD progress note, see updated measurments above which show excellent progress made with PT. She does still lack knee flexion ROM and overall functional knee strength and will continue to benefit from PT.    Personal Factors and Comorbidities Age;Comorbidity 2    Comorbidities migraines, depression    Examination-Activity Limitations Locomotion Level;Transfers;Squat;Stairs;Stand;Lift;Hygiene/Grooming;Dressing;Carry;Caring for Others;Bathing    Examination-Participation Restrictions Meal Prep;Occupation;Cleaning;Driving;Pincus Badder [facility]    Stability/Clinical Decision Making Stable/Uncomplicated    Rehab Potential Good    PT Frequency 3x / week   2-3x/wk   PT Duration 8 weeks    PT Treatment/Interventions ADLs/Self Care Home Management;Cryotherapy;Electrical Stimulation;Iontophoresis 4mg /ml Dexamethasone;Moist Heat;Ultrasound;Therapeutic activities;Therapeutic exercise;Balance training;Neuromuscular re-education;Patient/family  education;Manual techniques;Dry needling;Taping;Vasopneumatic Device;Passive range of motion;Stair training;Gait training    PT Next Visit Plan continue with current HEP, working on quad activation, BFR at 60%, ROM    PT  Home Exercise Plan [id number] - SLR 4 way, quad set, heel slides    Consulted and Agree with Plan of Care Patient           Patient will benefit from skilled therapeutic intervention in order to improve the following deficits and impairments:  Abnormal gait, Improper body mechanics, Increased muscle spasms, Decreased strength, Pain, Decreased activity tolerance, Decreased balance, Decreased endurance, Difficulty walking, Decreased range of motion, Decreased mobility, Increased edema  Visit Diagnosis: Chronic pain of left knee  Muscle weakness (generalized)  Other abnormalities of gait and mobility  Localized edema  Stiffness of left knee, not elsewhere classified     Problem List Patient Active Problem List   Diagnosis Date Noted  . 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 Right carpal tunnel syndrome 12/06/2019  . Xerosis cutis 10/16/2019  . Greater trochanteric bursitis of both hips 08/09/2018  . Nicotine vapor product user 08/09/2018  . Erythrocytosis 08/09/2018  . Greater trochanteric bursitis of right hip 02/06/2017  . Chronic right SI joint pain 02/06/2017  . Visit for screening mammogram 12/13/2016  . Family history of premature CAD 12/13/2016  . Radiculitis of left cervical region 03/23/2016  . 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 Routine general medical examination at a health care facility 03/22/2013  . Allergic rhinitis, cause unspecified 03/22/2013  . Depression with anxiety 10/31/2011    Birdie Riddle 07/08/2020, 4:52 PM  [facility] Physical Therapy [address] [place], [place], [address] Phone: [phone]   Fax:  [phone]  Name: Danielle Cook MRN: [id number] Date of Birth: 26-Oct-1976

## 2020-07-10 ENCOUNTER — Ambulatory Visit (INDEPENDENT_AMBULATORY_CARE_PROVIDER_SITE_OTHER): Payer: 59 | Admitting: Surgical

## 2020-07-10 DIAGNOSIS — G5601 Carpal tunnel syndrome, right upper limb: Secondary | ICD-10-CM

## 2020-07-10 DIAGNOSIS — Z9889 Other specified postprocedural states: Secondary | ICD-10-CM

## 2020-07-10 DIAGNOSIS — R200 Anesthesia of skin: Secondary | ICD-10-CM

## 2020-07-10 MED ORDER — ORPHENADRINE CITRATE ER 100 MG PO TB12: 100.0000 mg | ORAL_TABLET | Freq: Two times a day (BID) | ORAL | 0 refills | Status: DC | PRN

## 2020-07-13 ENCOUNTER — Other Ambulatory Visit: Payer: Self-pay

## 2020-07-13 ENCOUNTER — Ambulatory Visit (INDEPENDENT_AMBULATORY_CARE_PROVIDER_SITE_OTHER): Payer: 59 | Admitting: Physical Therapy

## 2020-07-13 ENCOUNTER — Encounter: Payer: Self-pay | Admitting: Physical Therapy

## 2020-07-13 DIAGNOSIS — G8929 Other chronic pain: Secondary | ICD-10-CM

## 2020-07-13 DIAGNOSIS — M25562 Pain in left knee: Secondary | ICD-10-CM

## 2020-07-13 DIAGNOSIS — M6281 Muscle weakness (generalized): Secondary | ICD-10-CM | POA: Diagnosis not present

## 2020-07-13 DIAGNOSIS — R2689 Other abnormalities of gait and mobility: Secondary | ICD-10-CM | POA: Diagnosis not present

## 2020-07-13 DIAGNOSIS — M25662 Stiffness of left knee, not elsewhere classified: Secondary | ICD-10-CM

## 2020-07-13 DIAGNOSIS — R600 Localized edema: Secondary | ICD-10-CM

## 2020-07-13 NOTE — Therapy (Signed)
[facility] Physical Therapy [address] [place], [place], [address] Phone: [phone]   Fax:  [phone]  Physical Therapy Treatment  Patient Details  Name: Danielle Cook MRN: [id number] Date of Birth: 1976-08-26 Referring Provider (PT): August Saucer Corrie Mckusick, MD   Encounter Date: 07/13/2020   PT End of Session - 07/13/20 1249    Visit Number 11    Number of Visits 30    Date for PT Re-Evaluation 08/17/20    PT Start Time 1203   pt arrived late   PT Stop Time 1230    PT Time Calculation (min) 27 min    Activity Tolerance Patient tolerated treatment well    Behavior During Therapy [facility] for tasks assessed/performed           Past Medical History:  Diagnosis Date  . Abnormal Pap smear   . Allergy   . Complication of anesthesia   . History of chicken pox   . 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 Hyperlipidemia   . Migraine   . PONV (postoperative nausea and vomiting)   . PPD positive, treated 2004    Past Surgical History:  Procedure Laterality Date  . ANTERIOR CRUCIATE LIGAMENT REPAIR Left 06/11/2020   Procedure: LEFT KNEE ANTERIOR CRUCIATE LIGAMENT (ACL) RECONSTRUCTION WITH WITH QUAD ALLOGRAFT, MENISCAL REPAIR vs DEBRIDEMENT;  Surgeon: Cammy Copa, MD;  Location: [facility] OR;  Service: Orthopedics;  Laterality: Left;  . CHOLECYSTECTOMY, LAPAROSCOPIC  07/13/2000  . COLPOSCOPY  2000,   . TONSILLECTOMY    . WISDOM TOOTH EXTRACTION  1995 Jan    There were no vitals filed for this visit.   Subjective Assessment - 07/13/20 1204    Subjective doing well, cleared to return to work. likely won't return to working on the floor 2-3 weeks.    Limitations Standing;Lifting;Walking    How long can you sit comfortably? unlimited    How long can you stand comfortably? unlimited with brace on    How long can you walk comfortably? unlimited with brace on    Diagnostic tests 02/29/2020 IMPRESSION:1. Complete tear of the anterior cruciate ligament.2. Small longitudinal  tear of the undersurface of the periphery ofthe posterior horn of the medial meniscus.3. Slight sprains of the femoral attachments of the medial andlateral collateral ligaments.4. Prominent joint effusion.5. 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    Patient Stated Goals get back to work, return to regular activities    Currently in Pain? No/denies                             [facility] Adult PT Treatment/Exercise - 07/13/20 1206      Knee/Hip Exercises: Aerobic   Recumbent Bike L5 x 8 min      Knee/Hip Exercises: Machines for Strengthening   Cybex Knee Extension up with both, down with Lt only 10 lbs 3X10    Cybex Knee Flexion Lt leg only 25 lbs 3x10    Total Gym Leg Press LLE 75# 3x10                    PT Short Term Goals - 07/08/20 1651      PT SHORT TERM GOAL #1   Title improve Lt knee AROM 0-100 for improved function    Time 4    Period Weeks    Status Achieved    Target Date 07/20/20      PT SHORT TERM GOAL #2  Title amb without AD independently for improved function    Status Achieved    Target Date 07/20/20      PT SHORT TERM GOAL #3   Title report pain < 2/10 with amb for improved mobility    Status Achieved    Target Date 07/20/20             PT Long Term Goals - 07/08/20 1651      PT LONG TERM GOAL #1   Title increase Gross LLE strength to >/= 4+/5 to promote knee stability with walking/ standing    Status On-going      PT LONG TERM GOAL #2   Title improve Lt knee AROM 0-120 for improved function and mobility    Time 6    Status Achieved      PT LONG TERM GOAL #3   Title pt to be able to perform SLS balance for >/= 30 sec with no report of instability to promote stability    Status On-going      PT LONG TERM GOAL #4   Title pt to be I with all HEP given to maintain and progress current level of function    Status On-going      PT LONG TERM GOAL #5   Title negotiate stairs and gait on various surfaces  independently without deviations for imrpoved community access    Status On-going                 Plan - 07/13/20 1249    Clinical Impression Statement Pt arrived late to session so focused on strengthening today.  Will continue to benefit from PT to maximize function.    Personal Factors and Comorbidities Age;Comorbidity 2    Comorbidities migraines, depression    Examination-Activity Limitations Locomotion Level;Transfers;Squat;Stairs;Stand;Lift;Hygiene/Grooming;Dressing;Carry;Caring for Others;Bathing    Examination-Participation Restrictions Meal Prep;Occupation;Cleaning;Driving;Pincus Badder [facility]    Stability/Clinical Decision Making Stable/Uncomplicated    Rehab Potential Good    PT Frequency 3x / week   2-3x/wk   PT Duration 8 weeks    PT Treatment/Interventions ADLs/Self Care Home Management;Cryotherapy;Electrical Stimulation;Iontophoresis 4mg /ml Dexamethasone;Moist Heat;Ultrasound;Therapeutic activities;Therapeutic exercise;Balance training;Neuromuscular re-education;Patient/family education;Manual techniques;Dry needling;Taping;Vasopneumatic Device;Passive range of motion;Stair training;Gait training    PT Next Visit Plan continue with current HEP, working on quad activation, BFR at 60%, ROM    PT Home Exercise Plan [id number] - SLR 4 way, quad set, heel slides    Consulted and Agree with Plan of Care Patient           Patient will benefit from skilled therapeutic intervention in order to improve the following deficits and impairments:  Abnormal gait, Improper body mechanics, Increased muscle spasms, Decreased strength, Pain, Decreased activity tolerance, Decreased balance, Decreased endurance, Difficulty walking, Decreased range of motion, Decreased mobility, Increased edema  Visit Diagnosis: Chronic pain of left knee  Muscle weakness (generalized)  Other abnormalities of gait and mobility  Localized edema  Stiffness of left knee, not elsewhere  classified     Problem List Patient Active Problem List   Diagnosis Date Noted  . 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 Right carpal tunnel syndrome 12/06/2019  . Xerosis cutis 10/16/2019  . Greater trochanteric bursitis of both hips 08/09/2018  . Nicotine vapor product user 08/09/2018  . Erythrocytosis 08/09/2018  . Greater trochanteric bursitis of right hip 02/06/2017  . Chronic right SI joint pain 02/06/2017  . Visit for screening mammogram 12/13/2016  . Family history of premature CAD 12/13/2016  . Radiculitis of  left cervical region 03/23/2016  . 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 Routine general medical examination at a health care facility 03/22/2013  . Allergic rhinitis, cause unspecified 03/22/2013  . Depression with anxiety 10/31/2011      Clarita Crane, PT, DPT 07/13/20 12:51 PM    [facility] Physical Therapy [address] [place], [place], [address] Phone: [phone]   Fax:  [phone]  Name: CLYDE UPSHAW MRN: [id number] Date of Birth: 02-Jun-1976

## 2020-07-15 ENCOUNTER — Ambulatory Visit (INDEPENDENT_AMBULATORY_CARE_PROVIDER_SITE_OTHER): Payer: 59 | Admitting: Physical Therapy

## 2020-07-15 ENCOUNTER — Other Ambulatory Visit: Payer: Self-pay

## 2020-07-15 ENCOUNTER — Encounter: Payer: Self-pay | Admitting: Physical Therapy

## 2020-07-15 DIAGNOSIS — G8929 Other chronic pain: Secondary | ICD-10-CM

## 2020-07-15 DIAGNOSIS — R600 Localized edema: Secondary | ICD-10-CM

## 2020-07-15 DIAGNOSIS — M25562 Pain in left knee: Secondary | ICD-10-CM

## 2020-07-15 DIAGNOSIS — R2689 Other abnormalities of gait and mobility: Secondary | ICD-10-CM | POA: Diagnosis not present

## 2020-07-15 DIAGNOSIS — M6281 Muscle weakness (generalized): Secondary | ICD-10-CM

## 2020-07-15 DIAGNOSIS — M25662 Stiffness of left knee, not elsewhere classified: Secondary | ICD-10-CM

## 2020-07-15 NOTE — Therapy (Signed)
[facility] Physical Therapy [address] [place], [place], [address] Phone: [phone]   Fax:  [phone]  Physical Therapy Treatment  Patient Details  Name: Danielle Cook MRN: [id number] Date of Birth: May 27, 1976 Referring Provider (PT): August Saucer Corrie Mckusick, MD   Encounter Date: 07/15/2020   PT End of Session - 07/15/20 1256    Visit Number 12    Number of Visits 30    Date for PT Re-Evaluation 08/17/20    PT Start Time 1156    PT Stop Time 1227    PT Time Calculation (min) 31 min    Activity Tolerance Patient tolerated treatment well    Behavior During Therapy [facility] for tasks assessed/performed           Past Medical History:  Diagnosis Date  . Abnormal Pap smear   . Allergy   . Complication of anesthesia   . History of chicken pox   . 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 Hyperlipidemia   . Migraine   . PONV (postoperative nausea and vomiting)   . PPD positive, treated 2004    Past Surgical History:  Procedure Laterality Date  . ANTERIOR CRUCIATE LIGAMENT REPAIR Left 06/11/2020   Procedure: LEFT KNEE ANTERIOR CRUCIATE LIGAMENT (ACL) RECONSTRUCTION WITH WITH QUAD ALLOGRAFT, MENISCAL REPAIR vs DEBRIDEMENT;  Surgeon: Cammy Copa, MD;  Location: [facility] OR;  Service: Orthopedics;  Laterality: Left;  . CHOLECYSTECTOMY, LAPAROSCOPIC  07/13/2000  . COLPOSCOPY  2000,   . TONSILLECTOMY    . WISDOM TOOTH EXTRACTION  1995 Jan    There were no vitals filed for this visit.   Subjective Assessment - 07/15/20 1157    Subjective knee is doing well.    Limitations Standing;Lifting;Walking    How long can you sit comfortably? unlimited    How long can you stand comfortably? unlimited with brace on    How long can you walk comfortably? unlimited with brace on    Diagnostic tests 02/29/2020 IMPRESSION:1. Complete tear of the anterior cruciate ligament.2. Small longitudinal tear of the undersurface of the periphery ofthe posterior horn of the medial meniscus.3.  Slight sprains of the femoral attachments of the medial andlateral collateral ligaments.4. Prominent joint effusion.5. 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    Patient Stated Goals get back to work, return to regular activities    Currently in Pain? No/denies                             [facility] Adult PT Treatment/Exercise - 07/15/20 1200      Knee/Hip Exercises: Aerobic   Recumbent Bike L5 x 8 min      Knee/Hip Exercises: Machines for Strengthening   Total Gym Leg Press LLE 75# x10; 81# 3x10      Knee/Hip Exercises: Standing   Forward Step Up Left;20 reps;Hand Hold: 0;Step Height: 8"    SLS LLE with ball toss; added single leg calf raise - intermittent RLE tap down    Other Standing Knee Exercises LLE deadlift 3x10; 5# KB, 1UE support                    PT Short Term Goals - 07/08/20 1651      PT SHORT TERM GOAL #1   Title improve Lt knee AROM 0-100 for improved function    Time 4    Period Weeks    Status Achieved    Target Date 07/20/20  PT SHORT TERM GOAL #2   Title amb without AD independently for improved function    Status Achieved    Target Date 07/20/20      PT SHORT TERM GOAL #3   Title report pain < 2/10 with amb for improved mobility    Status Achieved    Target Date 07/20/20             PT Long Term Goals - 07/08/20 1651      PT LONG TERM GOAL #1   Title increase Gross LLE strength to >/= 4+/5 to promote knee stability with walking/ standing    Status On-going      PT LONG TERM GOAL #2   Title improve Lt knee AROM 0-120 for improved function and mobility    Time 6    Status Achieved      PT LONG TERM GOAL #3   Title pt to be able to perform SLS balance for >/= 30 sec with no report of instability to promote stability    Status On-going      PT LONG TERM GOAL #4   Title pt to be I with all HEP given to maintain and progress current level of function    Status On-going      PT LONG TERM GOAL #5    Title negotiate stairs and gait on various surfaces independently without deviations for imrpoved community access    Status On-going                 Plan - 07/15/20 1256    Clinical Impression Statement Continued to progress strengthening today and tolerated session well overall.  Progressing well with PT.    Personal Factors and Comorbidities Age;Comorbidity 2    Comorbidities migraines, depression    Examination-Activity Limitations Locomotion Level;Transfers;Squat;Stairs;Stand;Lift;Hygiene/Grooming;Dressing;Carry;Caring for Others;Bathing    Examination-Participation Restrictions Meal Prep;Occupation;Cleaning;Driving;Pincus Badder [facility]    Stability/Clinical Decision Making Stable/Uncomplicated    Rehab Potential Good    PT Frequency 3x / week   2-3x/wk   PT Duration 8 weeks    PT Treatment/Interventions ADLs/Self Care Home Management;Cryotherapy;Electrical Stimulation;Iontophoresis 4mg /ml Dexamethasone;Moist Heat;Ultrasound;Therapeutic activities;Therapeutic exercise;Balance training;Neuromuscular re-education;Patient/family education;Manual techniques;Dry needling;Taping;Vasopneumatic Device;Passive range of motion;Stair training;Gait training    PT Next Visit Plan continue with current HEP, working on quad activation, BFR at 60%, ROM    PT Home Exercise Plan [id number] - SLR 4 way, quad set, heel slides    Consulted and Agree with Plan of Care Patient           Patient will benefit from skilled therapeutic intervention in order to improve the following deficits and impairments:  Abnormal gait, Improper body mechanics, Increased muscle spasms, Decreased strength, Pain, Decreased activity tolerance, Decreased balance, Decreased endurance, Difficulty walking, Decreased range of motion, Decreased mobility, Increased edema  Visit Diagnosis: Chronic pain of left knee  Muscle weakness (generalized)  Other abnormalities of gait and mobility  Localized edema  Stiffness of left  knee, not elsewhere classified     Problem List Patient Active Problem List   Diagnosis Date Noted  . 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 Right carpal tunnel syndrome 12/06/2019  . Xerosis cutis 10/16/2019  . Greater trochanteric bursitis of both hips 08/09/2018  . Nicotine vapor product user 08/09/2018  . Erythrocytosis 08/09/2018  . Greater trochanteric bursitis of right hip 02/06/2017  . Chronic right SI joint pain 02/06/2017  . Visit for screening mammogram 12/13/2016  . Family history of premature CAD 12/13/2016  .  Radiculitis of left cervical region 03/23/2016  . 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 Routine general medical examination at a health care facility 03/22/2013  . Allergic rhinitis, cause unspecified 03/22/2013  . Depression with anxiety 10/31/2011      Clarita Crane, PT, DPT 07/15/20 12:57 PM    [facility] Physical Therapy [address] [place], [place], [address] Phone: [phone]   Fax:  [phone]  Name: Danielle Cook MRN: [id number] Date of Birth: 07-28-1976

## 2020-07-21 ENCOUNTER — Encounter: Payer: Self-pay | Admitting: Physical Therapy

## 2020-07-21 ENCOUNTER — Ambulatory Visit: Payer: 59 | Admitting: Physical Therapy

## 2020-07-21 ENCOUNTER — Other Ambulatory Visit: Payer: Self-pay

## 2020-07-21 DIAGNOSIS — G8929 Other chronic pain: Secondary | ICD-10-CM

## 2020-07-21 DIAGNOSIS — R600 Localized edema: Secondary | ICD-10-CM

## 2020-07-21 DIAGNOSIS — R2689 Other abnormalities of gait and mobility: Secondary | ICD-10-CM | POA: Diagnosis not present

## 2020-07-21 DIAGNOSIS — M6281 Muscle weakness (generalized): Secondary | ICD-10-CM | POA: Diagnosis not present

## 2020-07-21 DIAGNOSIS — M25562 Pain in left knee: Secondary | ICD-10-CM

## 2020-07-21 DIAGNOSIS — M25662 Stiffness of left knee, not elsewhere classified: Secondary | ICD-10-CM

## 2020-07-21 NOTE — Therapy (Signed)
[facility] [address] [place], [place], [address] Phone: [phone]   Fax:  [phone]  Physical Therapy Treatment  Patient Details  Name: Danielle Cook MRN: [id number] Date of Birth: Jun 04, 1976 Referring Provider (PT): August Saucer Corrie Mckusick, MD   Encounter Date: 07/21/2020   PT End of Session - 07/21/20 1230    Visit Number 13    Number of Visits 30    Date for PT Re-Evaluation 08/17/20    PT Start Time 1150    PT Stop Time 1230    PT Time Calculation (min) 40 min    Activity Tolerance Patient tolerated treatment well    Behavior During Therapy [facility] for tasks assessed/performed           Past Medical History:  Diagnosis Date  . Abnormal Pap smear   . Allergy   . Complication of anesthesia   . History of chicken pox   . 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 Hyperlipidemia   . Migraine   . PONV (postoperative nausea and vomiting)   . PPD positive, treated 2004    Past Surgical History:  Procedure Laterality Date  . ANTERIOR CRUCIATE LIGAMENT REPAIR Left 06/11/2020   Procedure: LEFT KNEE ANTERIOR CRUCIATE LIGAMENT (ACL) RECONSTRUCTION WITH WITH QUAD ALLOGRAFT, MENISCAL REPAIR vs DEBRIDEMENT;  Surgeon: Cammy Copa, MD;  Location: [facility] OR;  Service: Orthopedics;  Laterality: Left;  . CHOLECYSTECTOMY, LAPAROSCOPIC  07/13/2000  . COLPOSCOPY  2000,   . TONSILLECTOMY    . WISDOM TOOTH EXTRACTION  1995 Jan    There were no vitals filed for this visit.   Subjective Assessment - 07/21/20 1155    Subjective doing well - stiff and sore today    Limitations Standing;Lifting;Walking    How long can you sit comfortably? unlimited    How long can you stand comfortably? unlimited with brace on    How long can you walk comfortably? unlimited with brace on    Diagnostic tests 02/29/2020 IMPRESSION:1. Complete tear of the anterior cruciate ligament.2. Small longitudinal tear of the undersurface of the periphery ofthe posterior horn of the medial  meniscus.3. Slight sprains of the femoral attachments of the medial andlateral collateral ligaments.4. Prominent joint effusion.5. 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    Patient Stated Goals get back to work, return to regular activities    Currently in Pain? No/denies                             [facility] Adult PT Treatment/Exercise - 07/21/20 1158      Knee/Hip Exercises: Stretches   Active Hamstring Stretch 3 reps;Left;30 seconds    Quad Stretch Left;3 reps;60 seconds    Quad Stretch Limitations prone with strap      Knee/Hip Exercises: Aerobic   Nustep L6 X 9 min LE only      Knee/Hip Exercises: Machines for Strengthening   Cybex Knee Extension up with both, down with Lt only 10 lbs 3X10    Cybex Knee Flexion Lt leg only 25 lbs 3x10      Knee/Hip Exercises: Standing   Other Standing Knee Exercises LLE deadlift 3x10; 10# KB, 1UE support                    PT Short Term Goals - 07/08/20 1651      PT SHORT TERM GOAL #1   Title improve Lt knee AROM 0-100 for improved function  Time 4    Period Weeks    Status Achieved    Target Date 07/20/20      PT SHORT TERM GOAL #2   Title amb without AD independently for improved function    Status Achieved    Target Date 07/20/20      PT SHORT TERM GOAL #3   Title report pain < 2/10 with amb for improved mobility    Status Achieved    Target Date 07/20/20             PT Long Term Goals - 07/08/20 1651      PT LONG TERM GOAL #1   Title increase Gross LLE strength to >/= 4+/5 to promote knee stability with walking/ standing    Status On-going      PT LONG TERM GOAL #2   Title improve Lt knee AROM 0-120 for improved function and mobility    Time 6    Status Achieved      PT LONG TERM GOAL #3   Title pt to be able to perform SLS balance for >/= 30 sec with no report of instability to promote stability    Status On-going      PT LONG TERM GOAL #4   Title pt to be I with all  HEP given to maintain and progress current level of function    Status On-going      PT LONG TERM GOAL #5   Title negotiate stairs and gait on various surfaces independently without deviations for imrpoved community access    Status On-going                 Plan - 07/21/20 1240    Clinical Impression Statement Pt with increased stiffness today affecting mobility and strengthening.  Still able to tolerate session well without increase in pain.  Will continue to benefit from PT to address deficits listed.    Personal Factors and Comorbidities Age;Comorbidity 2    Comorbidities migraines, depression    Examination-Activity Limitations Locomotion Level;Transfers;Squat;Stairs;Stand;Lift;Hygiene/Grooming;Dressing;Carry;Caring for Others;Bathing    Examination-Participation Restrictions Meal Prep;Occupation;Cleaning;Driving;[facility]    Stability/Clinical Decision Making Stable/Uncomplicated    Rehab Potential Good    PT Frequency 3x / week   2-3x/wk   PT Duration 8 weeks    PT Treatment/Interventions ADLs/Self Care Home Management;Cryotherapy;Electrical Stimulation;Iontophoresis 4mg /ml Dexamethasone;Moist Heat;Ultrasound;Therapeutic activities;Therapeutic exercise;Balance training;Neuromuscular re-education;Patient/family education;Manual techniques;Dry needling;Taping;Vasopneumatic Device;Passive range of motion;Stair training;Gait training    PT Next Visit Plan continue with current HEP, working on quad activation, BFR at 60%, ROM; see how knee is feeling- still stiff?    PT Home Exercise Plan [id number] - SLR 4 way, quad set, heel slides    Consulted and Agree with Plan of Care Patient           Patient will benefit from skilled therapeutic intervention in order to improve the following deficits and impairments:  Abnormal gait, Improper body mechanics, Increased muscle spasms, Decreased strength, Pain, Decreased activity tolerance, Decreased balance, Decreased endurance,  Difficulty walking, Decreased range of motion, Decreased mobility, Increased edema  Visit Diagnosis: Chronic pain of left knee  Muscle weakness (generalized)  Other abnormalities of gait and mobility  Localized edema  Stiffness of left knee, not elsewhere classified     Problem List Patient Active Problem List   Diagnosis Date Noted  . 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 Right carpal tunnel syndrome 12/06/2019  . Xerosis cutis 10/16/2019  . Greater trochanteric bursitis of both hips  08/09/2018  . Nicotine vapor product user 08/09/2018  . Erythrocytosis 08/09/2018  . Greater trochanteric bursitis of right hip 02/06/2017  . Chronic right SI joint pain 02/06/2017  . Visit for screening mammogram 12/13/2016  . Family history of premature CAD 12/13/2016  . Radiculitis of left cervical region 03/23/2016  . 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 Routine general medical examination at a health care facility 03/22/2013  . Allergic rhinitis, cause unspecified 03/22/2013  . Depression with anxiety 10/31/2011       Clarita Crane, PT, DPT 07/21/20 12:53 PM     [facility] [address] [place], [place], [address] Phone: [phone]   Fax:  [phone]  Name: Danielle Cook MRN: [id number] Date of Birth: 1975/12/14

## 2020-07-23 ENCOUNTER — Telehealth: Payer: Self-pay | Admitting: Orthopedic Surgery

## 2020-07-23 NOTE — Telephone Encounter (Signed)
Received call from pt, requesting copy of records. Advised patient will need to sign release form.she will sign when she comes in for P.T. appt 9/24.

## 2020-07-24 ENCOUNTER — Encounter: Payer: 59 | Admitting: Physical Therapy

## 2020-07-27 ENCOUNTER — Encounter: Payer: Self-pay | Admitting: Surgical

## 2020-07-27 NOTE — Progress Notes (Signed)
Post-Op Visit Note   Patient: Danielle Cook           Date of Birth: [birth year]           MRN: [id number] Visit Date: 07/10/2020 PCP: Etta Grandchild, MD   Assessment & Plan:  Chief Complaint:  Chief Complaint  Patient presents with  . Left Knee - Routine Post Op   Visit Diagnoses:  1. S/P left knee arthroscopy   2. Hand numbness   3. Right carpal tunnel syndrome     Plan: Patient is a 44 year old female presents s/p left knee ACL reconstruction on 06/11/2020.  Patient notes that she is doing excellent.  She feels that she is ready go back to work.  She notes that the aspiration of her left knee has helped with her symptoms.  Her incisions are healing well.  She denies any instability or mechanical locking episodes.  On exam she has 0 degrees of extension to 120 degrees of flexion.  Her graft is stable on exam.  Her knee is without effusion.  No calf tenderness on exam.  Negative Homans' sign.  She is taking meloxicam with good relief of pain.  She is not having to take any narcotic pain medication.  She is doing physical therapy where she is focusing on range of motion exercises as well as using blood flow restriction therapy to work on quad and hamstring strength.  She has excellent quad and hamstring strength on exam today considering how far out from procedure she is.  Overall she is doing well and plan for her to return for reevaluation by Dr. August Saucer in 6 weeks.  Patient given work note to return to work on Monday.  Additionally she has complaint of right wrist numbness and tingling.  This mostly affects her middle finger and index finger.  She denies any neck symptoms or radicular pain down the rest of the arm.  Only isolated numbness and tingling to the right wrist extending into the middle finger and index finger.  No symptoms in the fourth or fifth fingers.  No symptoms coming from her elbow.  Symptoms are bothersome more so at night.  She uses a brace on occasion with some  improvement in pain but nothing that is exceptionally relieving.  Plan for nerve conduction study of the right upper extremity to evaluate for carpal tunnel syndrome.  Follow-up after study to review results.  Follow-Up Instructions: No follow-ups on file.   Orders:  Orders Placed This Encounter  Procedures  . Ambulatory referral to Physical Medicine Rehab   Meds ordered this encounter  Medications  . orphenadrine (NORFLEX) 100 MG tablet    Sig: Take 1 tablet (100 mg total) by mouth 2 (two) times daily as needed for muscle spasms.    Dispense:  40 tablet    Refill:  0    Imaging: No results found.  PMFS History: Patient Active Problem List   Diagnosis Date Noted  . 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 Right carpal tunnel syndrome 12/06/2019  . Xerosis cutis 10/16/2019  . Greater trochanteric bursitis of both hips 08/09/2018  . Nicotine vapor product user 08/09/2018  . Erythrocytosis 08/09/2018  . Greater trochanteric bursitis of right hip 02/06/2017  . Chronic right SI joint pain 02/06/2017  . Visit for screening mammogram 12/13/2016  . Family history of premature CAD 12/13/2016  . Radiculitis of left cervical region 03/23/2016  . 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 Routine general  medical examination at a health care facility 03/22/2013  . Allergic rhinitis, cause unspecified 03/22/2013  . Depression with anxiety 10/31/2011   Past Medical History:  Diagnosis Date  . Abnormal Pap smear   . Allergy   . Complication of anesthesia   . History of chicken pox   . 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 Hyperlipidemia   . Migraine   . PONV (postoperative nausea and vomiting)   . PPD positive, treated 2004    Family History  Problem Relation Age of Onset  . Stroke Mother   . Dementia Mother   . Hyperlipidemia Mother   . Early death Father   . Heart disease Father   . Hyperlipidemia Father   . Cancer Neg Hx   . Depression Neg Hx   . Diabetes Neg Hx   . Drug abuse  Neg Hx   . Hearing loss Neg Hx   . Hypertension Neg Hx     Past Surgical History:  Procedure Laterality Date  . ANTERIOR CRUCIATE LIGAMENT REPAIR Left 06/11/2020   Procedure: LEFT KNEE ANTERIOR CRUCIATE LIGAMENT (ACL) RECONSTRUCTION WITH WITH QUAD ALLOGRAFT, MENISCAL REPAIR vs DEBRIDEMENT;  Surgeon: Cammy Copa, MD;  Location: [facility] OR;  Service: Orthopedics;  Laterality: Left;  . CHOLECYSTECTOMY, LAPAROSCOPIC  07/13/2000  . COLPOSCOPY  2000,   . TONSILLECTOMY    . WISDOM TOOTH EXTRACTION  1995 Jan   Social History   Occupational History  . Not on file  Tobacco Use  . Smoking status: Current Every Day Smoker    Packs/day: 0.25    Years: 15.00    Pack years: 3.75    Types: E-cigarettes  . Smokeless tobacco: Never Used  Substance and Sexual Activity  . Alcohol use: No    Comment: few glasses of wine during pregnancy  . Drug use: No  . Sexual activity: Yes    Partners: Male

## 2020-07-28 ENCOUNTER — Encounter: Payer: Self-pay | Admitting: Physical Therapy

## 2020-07-28 ENCOUNTER — Ambulatory Visit: Payer: 59 | Admitting: Physical Therapy

## 2020-07-28 ENCOUNTER — Other Ambulatory Visit: Payer: Self-pay

## 2020-07-28 DIAGNOSIS — M6281 Muscle weakness (generalized): Secondary | ICD-10-CM

## 2020-07-28 DIAGNOSIS — M25562 Pain in left knee: Secondary | ICD-10-CM

## 2020-07-28 DIAGNOSIS — G8929 Other chronic pain: Secondary | ICD-10-CM

## 2020-07-28 DIAGNOSIS — R600 Localized edema: Secondary | ICD-10-CM | POA: Diagnosis not present

## 2020-07-28 DIAGNOSIS — M25662 Stiffness of left knee, not elsewhere classified: Secondary | ICD-10-CM

## 2020-07-28 DIAGNOSIS — R2689 Other abnormalities of gait and mobility: Secondary | ICD-10-CM

## 2020-07-28 NOTE — Therapy (Signed)
[facility] [address] [place], [place], [address] Phone: [phone]   Fax:  [phone]  Physical Therapy Treatment  Patient Details  Name: Danielle Cook MRN: [id number] Date of Birth: 28-Mar-1976 Referring Provider (PT): August Saucer Corrie Mckusick, MD   Encounter Date: 07/28/2020   PT End of Session - 07/28/20 1251    Visit Number 14    Number of Visits 30    Date for PT Re-Evaluation 08/17/20    PT Start Time 1150    PT Stop Time 1230    PT Time Calculation (min) 40 min    Activity Tolerance Patient tolerated treatment well    Behavior During Therapy [facility] for tasks assessed/performed           Past Medical History:  Diagnosis Date  . Abnormal Pap smear   . Allergy   . Complication of anesthesia   . History of chicken pox   . 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 Hyperlipidemia   . Migraine   . PONV (postoperative nausea and vomiting)   . PPD positive, treated 2004    Past Surgical History:  Procedure Laterality Date  . ANTERIOR CRUCIATE LIGAMENT REPAIR Left 06/11/2020   Procedure: LEFT KNEE ANTERIOR CRUCIATE LIGAMENT (ACL) RECONSTRUCTION WITH WITH QUAD ALLOGRAFT, MENISCAL REPAIR vs DEBRIDEMENT;  Surgeon: Cammy Copa, MD;  Location: [facility] OR;  Service: Orthopedics;  Laterality: Left;  . CHOLECYSTECTOMY, LAPAROSCOPIC  07/13/2000  . COLPOSCOPY  2000,   . TONSILLECTOMY    . WISDOM TOOTH EXTRACTION  1995 Jan    There were no vitals filed for this visit.   Subjective Assessment - 07/28/20 1155    Subjective knee is doing well - no issues.  stiffness and soreness with sitting for greater than 10 min    Limitations Standing;Lifting;Walking    How long can you sit comfortably? unlimited    How long can you stand comfortably? unlimited with brace on    How long can you walk comfortably? unlimited with brace on    Diagnostic tests 02/29/2020 IMPRESSION:1. Complete tear of the anterior cruciate ligament.2. Small longitudinal tear of the  undersurface of the periphery ofthe posterior horn of the medial meniscus.3. Slight sprains of the femoral attachments of the medial andlateral collateral ligaments.4. Prominent joint effusion.5. 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    Patient Stated Goals get back to work, return to regular activities    Currently in Pain? No/denies                             [facility] Adult PT Treatment/Exercise - 07/28/20 1156      Exercises   Other Exercises  BFR: cuff size 4, LOP 250 mmhg in supine, worked at 60% 150 mmHG      Knee/Hip Exercises: Aerobic   Recumbent Bike L5 x 8 min      Knee/Hip Exercises: Machines for Strengthening   Total Gym Leg Press LLE only 81# 3x10      Knee/Hip Exercises: Supine   Straight Leg Raises Strengthening;Left    Straight Leg Raises Limitations with BFR 30/15/15/15 reps with 30 sec rest between      Knee/Hip Exercises: Sidelying   Hip ABduction Strengthening;Left    Hip ABduction Limitations with BFR: 30/15/15/15 reps with 30-60 sec rest between sets                    PT Short Term Goals -  07/08/20 1651      PT SHORT TERM GOAL #1   Title improve Lt knee AROM 0-100 for improved function    Time 4    Period Weeks    Status Achieved    Target Date 07/20/20      PT SHORT TERM GOAL #2   Title amb without AD independently for improved function    Status Achieved    Target Date 07/20/20      PT SHORT TERM GOAL #3   Title report pain < 2/10 with amb for improved mobility    Status Achieved    Target Date 07/20/20             PT Long Term Goals - 07/08/20 1651      PT LONG TERM GOAL #1   Title increase Gross LLE strength to >/= 4+/5 to promote knee stability with walking/ standing    Status On-going      PT LONG TERM GOAL #2   Title improve Lt knee AROM 0-120 for improved function and mobility    Time 6    Status Achieved      PT LONG TERM GOAL #3   Title pt to be able to perform SLS balance for >/=  30 sec with no report of instability to promote stability    Status On-going      PT LONG TERM GOAL #4   Title pt to be I with all HEP given to maintain and progress current level of function    Status On-going      PT LONG TERM GOAL #5   Title negotiate stairs and gait on various surfaces independently without deviations for imrpoved community access    Status On-going                 Plan - 07/28/20 1252    Clinical Impression Statement Pt tolerated session well today utilizing BFR therapy - she needs reprofusion between exercises due to discomfort.  Will continue to benefit from PT to maximize function.    Personal Factors and Comorbidities Age;Comorbidity 2    Comorbidities migraines, depression    Examination-Activity Limitations Locomotion Level;Transfers;Squat;Stairs;Stand;Lift;Hygiene/Grooming;Dressing;Carry;Caring for Others;Bathing    Examination-Participation Restrictions Meal Prep;Occupation;Cleaning;Driving;[facility]    Stability/Clinical Decision Making Stable/Uncomplicated    Rehab Potential Good    PT Frequency 3x / week   2-3x/wk   PT Duration 8 weeks    PT Treatment/Interventions ADLs/Self Care Home Management;Cryotherapy;Electrical Stimulation;Iontophoresis 4mg /ml Dexamethasone;Moist Heat;Ultrasound;Therapeutic activities;Therapeutic exercise;Balance training;Neuromuscular re-education;Patient/family education;Manual techniques;Dry needling;Taping;Vasopneumatic Device;Passive range of motion;Stair training;Gait training    PT Next Visit Plan continue with current HEP, working on quad activation, BFR at 60%, ROM; continue strengthening    PT Home Exercise Plan [id number] - SLR 4 way, quad set, heel slides    Consulted and Agree with Plan of Care Patient           Patient will benefit from skilled therapeutic intervention in order to improve the following deficits and impairments:  Abnormal gait, Improper body mechanics, Increased muscle spasms, Decreased  strength, Pain, Decreased activity tolerance, Decreased balance, Decreased endurance, Difficulty walking, Decreased range of motion, Decreased mobility, Increased edema  Visit Diagnosis: Chronic pain of left knee  Muscle weakness (generalized)  Other abnormalities of gait and mobility  Localized edema  Stiffness of left knee, not elsewhere classified     Problem List Patient Active Problem List   Diagnosis Date Noted  . 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 Right  carpal tunnel syndrome 12/06/2019  . Xerosis cutis 10/16/2019  . Greater trochanteric bursitis of both hips 08/09/2018  . Nicotine vapor product user 08/09/2018  . Erythrocytosis 08/09/2018  . Greater trochanteric bursitis of right hip 02/06/2017  . Chronic right SI joint pain 02/06/2017  . Visit for screening mammogram 12/13/2016  . Family history of premature CAD 12/13/2016  . Radiculitis of left cervical region 03/23/2016  . 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 Routine general medical examination at a health care facility 03/22/2013  . Allergic rhinitis, cause unspecified 03/22/2013  . Depression with anxiety 10/31/2011      Clarita Crane, PT, DPT 07/28/20 12:53 PM    [facility] [address] [place], [place], [address] Phone: [phone]   Fax:  [phone]  Name: BRITTA LOUTH MRN: [id number] Date of Birth: [birth year]

## 2020-07-31 ENCOUNTER — Ambulatory Visit (INDEPENDENT_AMBULATORY_CARE_PROVIDER_SITE_OTHER): Payer: 59 | Admitting: Physical Therapy

## 2020-07-31 ENCOUNTER — Other Ambulatory Visit: Payer: Self-pay

## 2020-07-31 ENCOUNTER — Encounter: Payer: Self-pay | Admitting: Physical Therapy

## 2020-07-31 DIAGNOSIS — M25662 Stiffness of left knee, not elsewhere classified: Secondary | ICD-10-CM

## 2020-07-31 DIAGNOSIS — R600 Localized edema: Secondary | ICD-10-CM | POA: Diagnosis not present

## 2020-07-31 DIAGNOSIS — R2689 Other abnormalities of gait and mobility: Secondary | ICD-10-CM | POA: Diagnosis not present

## 2020-07-31 DIAGNOSIS — M25562 Pain in left knee: Secondary | ICD-10-CM

## 2020-07-31 DIAGNOSIS — M6281 Muscle weakness (generalized): Secondary | ICD-10-CM

## 2020-07-31 DIAGNOSIS — G8929 Other chronic pain: Secondary | ICD-10-CM

## 2020-07-31 NOTE — Therapy (Signed)
[facility] [address] [place], [place], [address] Phone: [phone]   Fax:  [phone]  Physical Therapy Treatment  Patient Details  Name: Danielle Cook MRN: [id number] Date of Birth: 10-13-1976 Referring Provider (PT): August Saucer Corrie Mckusick, MD   Encounter Date: 07/31/2020   PT End of Session - 07/31/20 1139    Visit Number 15    Number of Visits 30    Date for PT Re-Evaluation 08/17/20    PT Start Time 1104    PT Stop Time 1142    PT Time Calculation (min) 38 min    Activity Tolerance Patient tolerated treatment well    Behavior During Therapy  [facility] for tasks assessed/performed           Past Medical History:  Diagnosis Date  . Abnormal Pap smear   . Allergy   . Complication of anesthesia   . History of chicken pox   . 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 Hyperlipidemia   . Migraine   . PONV (postoperative nausea and vomiting)   . PPD positive, treated 2004    Past Surgical History:  Procedure Laterality Date  . ANTERIOR CRUCIATE LIGAMENT REPAIR Left 06/11/2020   Procedure: LEFT KNEE ANTERIOR CRUCIATE LIGAMENT (ACL) RECONSTRUCTION WITH WITH QUAD ALLOGRAFT, MENISCAL REPAIR vs DEBRIDEMENT;  Surgeon: Cammy Copa, MD;  Location: [facility] OR;  Service: Orthopedics;  Laterality: Left;  . CHOLECYSTECTOMY, LAPAROSCOPIC  07/13/2000  . COLPOSCOPY  2000,   . TONSILLECTOMY    . WISDOM TOOTH EXTRACTION  1995 Jan    There were no vitals filed for this visit.   Subjective Assessment - 07/31/20 1107    Subjective starts new job next week - may need to adjust schedule some.    Limitations Standing;Lifting;Walking    How long can you sit comfortably? unlimited    How long can you stand comfortably? unlimited with brace on    How long can you walk comfortably? unlimited with brace on    Diagnostic tests 02/29/2020 IMPRESSION:1. Complete tear of the anterior cruciate ligament.2. Small longitudinal tear of the undersurface of the periphery ofthe  posterior horn of the medial meniscus.3. Slight sprains of the femoral attachments of the medial andlateral collateral ligaments.4. Prominent joint effusion.5. 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    Patient Stated Goals get back to work, return to regular activities    Currently in Pain? No/denies                             [facility] Adult PT Treatment/Exercise - 07/31/20 1109      Knee/Hip Exercises: Aerobic   Recumbent Bike L5 x 8 min      Knee/Hip Exercises: Machines for Strengthening   Cybex Knee Extension up with both, down with Lt only 10 lbs 3X10    Cybex Knee Flexion Lt leg only 25 lbs 3x10    Total Gym Leg Press LLE only 81# 3x10      Knee/Hip Exercises: Standing   Other Standing Knee Exercises LLE deadlift 3x10; 5# DB bil - RLE with toe touch                    PT Short Term Goals - 07/08/20 1651      PT SHORT TERM GOAL #1   Title improve Lt knee AROM 0-100 for improved function    Time 4    Period Weeks    Status  Achieved    Target Date 07/20/20      PT SHORT TERM GOAL #2   Title amb without AD independently for improved function    Status Achieved    Target Date 07/20/20      PT SHORT TERM GOAL #3   Title report pain < 2/10 with amb for improved mobility    Status Achieved    Target Date 07/20/20             PT Long Term Goals - 07/08/20 1651      PT LONG TERM GOAL #1   Title increase Gross LLE strength to >/= 4+/5 to promote knee stability with walking/ standing    Status On-going      PT LONG TERM GOAL #2   Title improve Lt knee AROM 0-120 for improved function and mobility    Time 6    Status Achieved      PT LONG TERM GOAL #3   Title pt to be able to perform SLS balance for >/= 30 sec with no report of instability to promote stability    Status On-going      PT LONG TERM GOAL #4   Title pt to be I with all HEP given to maintain and progress current level of function    Status On-going      PT  LONG TERM GOAL #5   Title negotiate stairs and gait on various surfaces independently without deviations for imrpoved community access    Status On-going                 Plan - 07/31/20 1139    Clinical Impression Statement Tolerated strengthening well today needing min cues for technique.  Needs to decrease freq to 1x/wk as she's starting a new job next week.  Will continue to benefit from PT to maximize function.    Personal Factors and Comorbidities Age;Comorbidity 2    Comorbidities migraines, depression    Examination-Activity Limitations Locomotion Level;Transfers;Squat;Stairs;Stand;Lift;Hygiene/Grooming;Dressing;Carry;Caring for Others;Bathing    Examination-Participation Restrictions Meal Prep;Occupation;Cleaning;Driving;Pincus Badder [facility]    Stability/Clinical Decision Making Stable/Uncomplicated    Rehab Potential Good    PT Frequency 3x / week   2-3x/wk   PT Duration 8 weeks    PT Treatment/Interventions ADLs/Self Care Home Management;Cryotherapy;Electrical Stimulation;Iontophoresis 4mg /ml Dexamethasone;Moist Heat;Ultrasound;Therapeutic activities;Therapeutic exercise;Balance training;Neuromuscular re-education;Patient/family education;Manual techniques;Dry needling;Taping;Vasopneumatic Device;Passive range of motion;Stair training;Gait training    PT Next Visit Plan continue with current HEP, working on quad activation, BFR at 60%, ROM; continue strengthening    PT Home Exercise Plan [email or web address] - SLR 4 way, quad set, heel slides    Consulted and Agree with Plan of Care Patient           Patient will benefit from skilled therapeutic intervention in order to improve the following deficits and impairments:  Abnormal gait, Improper body mechanics, Increased muscle spasms, Decreased strength, Pain, Decreased activity tolerance, Decreased balance, Decreased endurance, Difficulty walking, Decreased range of motion, Decreased mobility, Increased edema  Visit Diagnosis: Chronic  pain of left knee  Muscle weakness (generalized)  Other abnormalities of gait and mobility  Localized edema  Stiffness of left knee, not elsewhere classified     Problem List Patient Active Problem List   Diagnosis Date Noted  . 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 Right carpal tunnel syndrome 12/06/2019  . Xerosis cutis 10/16/2019  . Greater trochanteric bursitis of both hips 08/09/2018  . Nicotine vapor product user 08/09/2018  . Erythrocytosis 08/09/2018  .  Greater trochanteric bursitis of right hip 02/06/2017  . Chronic right SI joint pain 02/06/2017  . Visit for screening mammogram 12/13/2016  . Family history of premature CAD 12/13/2016  . Radiculitis of left cervical region 03/23/2016  . 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 Routine general medical examination at a health care facility 03/22/2013  . Allergic rhinitis, cause unspecified 03/22/2013  . Depression with anxiety 10/31/2011      Clarita Crane, PT, DPT 07/31/20 11:42 AM    [facility] Physical Therapy [address] [place], [place], [address] Phone: [phone]   Fax:  [phone]  Name: Danielle Cook MRN: [id number] Date of Birth: 01-25-1976

## 2020-08-01 ENCOUNTER — Other Ambulatory Visit: Payer: Self-pay | Admitting: Surgical

## 2020-08-04 ENCOUNTER — Encounter: Payer: 59 | Admitting: Physical Therapy

## 2020-08-05 ENCOUNTER — Other Ambulatory Visit: Payer: Self-pay | Admitting: Internal Medicine

## 2020-08-05 DIAGNOSIS — F418 Other specified anxiety disorders: Secondary | ICD-10-CM

## 2020-08-07 ENCOUNTER — Encounter: Payer: Self-pay | Admitting: Physical Therapy

## 2020-08-07 ENCOUNTER — Other Ambulatory Visit: Payer: Self-pay

## 2020-08-07 ENCOUNTER — Ambulatory Visit: Payer: 59 | Admitting: Physical Therapy

## 2020-08-07 DIAGNOSIS — M6281 Muscle weakness (generalized): Secondary | ICD-10-CM | POA: Diagnosis not present

## 2020-08-07 DIAGNOSIS — R2689 Other abnormalities of gait and mobility: Secondary | ICD-10-CM | POA: Diagnosis not present

## 2020-08-07 DIAGNOSIS — R600 Localized edema: Secondary | ICD-10-CM

## 2020-08-07 DIAGNOSIS — M25662 Stiffness of left knee, not elsewhere classified: Secondary | ICD-10-CM

## 2020-08-07 DIAGNOSIS — M25562 Pain in left knee: Secondary | ICD-10-CM

## 2020-08-07 DIAGNOSIS — G8929 Other chronic pain: Secondary | ICD-10-CM

## 2020-08-07 NOTE — Therapy (Signed)
[facility] Physical Therapy [address] [place], [place], [address] Phone: [phone]   Fax:  [phone]  Physical Therapy Treatment  Patient Details  Name: Danielle Cook MRN: [id number] Date of Birth: Aug 25, 1976 Referring Provider (PT): August Saucer Corrie Mckusick, MD   Encounter Date: 08/07/2020   PT End of Session - 08/07/20 1144    Visit Number 16    Number of Visits 30    Date for PT Re-Evaluation 08/17/20    PT Start Time 1100    PT Stop Time 1144    PT Time Calculation (min) 44 min    Activity Tolerance Patient tolerated treatment well    Behavior During Therapy [facility] for tasks assessed/performed           Past Medical History:  Diagnosis Date  . Abnormal Pap smear   . Allergy   . Complication of anesthesia   . History of chicken pox   . 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 Hyperlipidemia   . Migraine   . PONV (postoperative nausea and vomiting)   . PPD positive, treated 2004    Past Surgical History:  Procedure Laterality Date  . ANTERIOR CRUCIATE LIGAMENT REPAIR Left 06/11/2020   Procedure: LEFT KNEE ANTERIOR CRUCIATE LIGAMENT (ACL) RECONSTRUCTION WITH WITH QUAD ALLOGRAFT, MENISCAL REPAIR vs DEBRIDEMENT;  Surgeon: Cammy Copa, MD;  Location: [facility] OR;  Service: Orthopedics;  Laterality: Left;  . CHOLECYSTECTOMY, LAPAROSCOPIC  07/13/2000  . COLPOSCOPY  2000,   . TONSILLECTOMY    . WISDOM TOOTH EXTRACTION  1995 Jan    There were no vitals filed for this visit.   Subjective Assessment - 08/07/20 1105    Subjective doing well; walked up the stairs to check in today    Limitations Standing;Lifting;Walking    How long can you sit comfortably? unlimited    How long can you stand comfortably? unlimited with brace on    How long can you walk comfortably? unlimited with brace on    Diagnostic tests 02/29/2020 IMPRESSION:1. Complete tear of the anterior cruciate ligament.2. Small longitudinal tear of the undersurface of the periphery ofthe posterior  horn of the medial meniscus.3. Slight sprains of the femoral attachments of the medial andlateral collateral ligaments.4. Prominent joint effusion.5. 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    Patient Stated Goals get back to work, return to regular activities    Currently in Pain? No/denies              [facility] PT Assessment - 08/07/20 1125      Assessment   Medical Diagnosis Lt ACL repair    Referring Provider (PT) August Saucer Corrie Mckusick, MD    Onset Date/Surgical Date 06/11/20      Strength   Overall Strength Comments Functional strength assessment using single leg reach. Rt leg 16.67" inch, Lt leg 15.17" = 91%                         [facility] Adult PT Treatment/Exercise - 08/07/20 1106      Knee/Hip Exercises: Aerobic   Recumbent Bike L7 x 8 min      Knee/Hip Exercises: Machines for Strengthening   Cybex Knee Extension up with both, down with Lt only 10 lbs 3X10    Cybex Knee Flexion Lt leg only 25 lbs 3x10      Knee/Hip Exercises: Standing   Side Lunges Left;2 sets;10 reps    Side Lunges Limitations TRX    Functional Squat  3 sets;10 reps;5 seconds    Functional Squat Limitations TRX    Other Standing Knee Exercises deadlifts 3x10; 15# KB                    PT Short Term Goals - 07/08/20 1651      PT SHORT TERM GOAL #1   Title improve Lt knee AROM 0-100 for improved function    Time 4    Period Weeks    Status Achieved    Target Date 07/20/20      PT SHORT TERM GOAL #2   Title amb without AD independently for improved function    Status Achieved    Target Date 07/20/20      PT SHORT TERM GOAL #3   Title report pain < 2/10 with amb for improved mobility    Status Achieved    Target Date 07/20/20             PT Long Term Goals - 07/08/20 1651      PT LONG TERM GOAL #1   Title increase Gross LLE strength to >/= 4+/5 to promote knee stability with walking/ standing    Status On-going      PT LONG TERM GOAL #2   Title  improve Lt knee AROM 0-120 for improved function and mobility    Time 6    Status Achieved      PT LONG TERM GOAL #3   Title pt to be able to perform SLS balance for >/= 30 sec with no report of instability to promote stability    Status On-going      PT LONG TERM GOAL #4   Title pt to be I with all HEP given to maintain and progress current level of function    Status On-going      PT LONG TERM GOAL #5   Title negotiate stairs and gait on various surfaces independently without deviations for imrpoved community access    Status On-going                 Plan - 08/07/20 1145    Clinical Impression Statement Pt tolerated session well today and demonstrated improved functional strength with SL functional reach.  Pt will continue to benefit from PT to maximize functional mobility.    Personal Factors and Comorbidities Age;Comorbidity 2    Comorbidities migraines, depression    Examination-Activity Limitations Locomotion Level;Transfers;Squat;Stairs;Stand;Lift;Hygiene/Grooming;Dressing;Carry;Caring for Others;Bathing    Examination-Participation Restrictions Meal Prep;Occupation;Cleaning;Driving;Pincus Badder [facility]    Stability/Clinical Decision Making Stable/Uncomplicated    Rehab Potential Good    PT Frequency 3x / week   2-3x/wk   PT Duration 8 weeks    PT Treatment/Interventions ADLs/Self Care Home Management;Cryotherapy;Electrical Stimulation;Iontophoresis 4mg /ml Dexamethasone;Moist Heat;Ultrasound;Therapeutic activities;Therapeutic exercise;Balance training;Neuromuscular re-education;Patient/family education;Manual techniques;Dry needling;Taping;Vasopneumatic Device;Passive range of motion;Stair training;Gait training    PT Next Visit Plan continue with current HEP, working on quad activation, BFR at 60%, ROM; continue strengthening    PT Home Exercise Plan [email or web address] - SLR 4 way, quad set, heel slides    Consulted and Agree with Plan of Care Patient           Patient will  benefit from skilled therapeutic intervention in order to improve the following deficits and impairments:  Abnormal gait, Improper body mechanics, Increased muscle spasms, Decreased strength, Pain, Decreased activity tolerance, Decreased balance, Decreased endurance, Difficulty walking, Decreased range of motion, Decreased mobility, Increased edema  Visit Diagnosis: Chronic pain of left knee  Muscle weakness (generalized)  Localized edema  Other abnormalities of gait and mobility  Stiffness of left knee, not elsewhere classified     Problem List Patient Active Problem List   Diagnosis Date Noted  . 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 Right carpal tunnel syndrome 12/06/2019  . Xerosis cutis 10/16/2019  . Greater trochanteric bursitis of both hips 08/09/2018  . Nicotine vapor product user 08/09/2018  . Erythrocytosis 08/09/2018  . Greater trochanteric bursitis of right hip 02/06/2017  . Chronic right SI joint pain 02/06/2017  . Visit for screening mammogram 12/13/2016  . Family history of premature CAD 12/13/2016  . Radiculitis of left cervical region 03/23/2016  . 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 Routine general medical examination at a health care facility 03/22/2013  . Allergic rhinitis, cause unspecified 03/22/2013  . Depression with anxiety 10/31/2011      Clarita Crane, PT, DPT 08/07/20 11:50 AM     [facility] Physical Therapy [address] [place], [place], [address] Phone: [phone]   Fax:  [phone]  Name: Danielle Cook MRN: [id number] Date of Birth: 10/25/1976

## 2020-08-11 ENCOUNTER — Encounter: Payer: 59 | Admitting: Physical Therapy

## 2020-08-13 ENCOUNTER — Encounter: Payer: 59 | Admitting: Physical Therapy

## 2020-08-15 ENCOUNTER — Other Ambulatory Visit: Payer: Self-pay | Admitting: Surgical

## 2020-08-17 ENCOUNTER — Other Ambulatory Visit: Payer: Self-pay

## 2020-08-17 ENCOUNTER — Ambulatory Visit: Payer: 59 | Admitting: Physical Therapy

## 2020-08-17 ENCOUNTER — Encounter: Payer: Self-pay | Admitting: Physical Therapy

## 2020-08-17 DIAGNOSIS — R2689 Other abnormalities of gait and mobility: Secondary | ICD-10-CM | POA: Diagnosis not present

## 2020-08-17 DIAGNOSIS — M25562 Pain in left knee: Secondary | ICD-10-CM | POA: Diagnosis not present

## 2020-08-17 DIAGNOSIS — M6281 Muscle weakness (generalized): Secondary | ICD-10-CM

## 2020-08-17 DIAGNOSIS — G8929 Other chronic pain: Secondary | ICD-10-CM

## 2020-08-17 DIAGNOSIS — R600 Localized edema: Secondary | ICD-10-CM

## 2020-08-17 DIAGNOSIS — M25662 Stiffness of left knee, not elsewhere classified: Secondary | ICD-10-CM

## 2020-08-17 NOTE — Therapy (Signed)
[facility] Physical Therapy [address] [place], [place], [address] Phone: [phone]   Fax:  [phone]  Physical Therapy Treatment/Recert  Patient Details  Name: Danielle Cook MRN: [id number] Date of Birth: [birth year] Referring Provider (PT): Marlou Sa Tonna Corner, MD   Encounter Date: 08/17/2020   PT End of Session - 08/17/20 1549    Visit Number 17    Number of Visits 23    Date for PT Re-Evaluation 09/28/20    PT Start Time 1502    PT Stop Time 1547    PT Time Calculation (min) 45 min    Activity Tolerance Patient tolerated treatment well    Behavior During Therapy [facility] for tasks assessed/performed           Past Medical History:  Diagnosis Date  . Abnormal Pap smear   . Allergy   . Complication of anesthesia   . History of chicken pox   . 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 Hyperlipidemia   . Migraine   . PONV (postoperative nausea and vomiting)   . PPD positive, treated 2004    Past Surgical History:  Procedure Laterality Date  . ANTERIOR CRUCIATE LIGAMENT REPAIR Left 06/11/2020   Procedure: LEFT KNEE ANTERIOR CRUCIATE LIGAMENT (ACL) RECONSTRUCTION WITH WITH QUAD ALLOGRAFT, MENISCAL REPAIR vs DEBRIDEMENT;  Surgeon: Meredith Pel, MD;  Location: [place];  Service: Orthopedics;  Laterality: Left;  . CHOLECYSTECTOMY, LAPAROSCOPIC  07/13/2000  . COLPOSCOPY  2000,   . TONSILLECTOMY    . [place] Jan    There were no vitals filed for this visit.   Subjective Assessment - 08/17/20 1504    Subjective walked up the stairs this afternoon.  started new job last week - overall doing okay with her knee.    Limitations Standing;Lifting;Walking    How long can you sit comfortably? unlimited    How long can you stand comfortably? unlimited with brace on    How long can you walk comfortably? unlimited with brace on    Diagnostic tests 02/29/2020 IMPRESSION:1. Complete tear of the anterior cruciate ligament.2. Small longitudinal  tear of the undersurface of the periphery ofthe posterior horn of the medial meniscus.3. Slight sprains of the femoral attachments of the medial andlateral collateral ligaments.4. Prominent joint effusion.5. 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    Patient Stated Goals get back to work, return to regular activities    Currently in Pain? No/denies              [facility] PT Assessment - 08/17/20 1521      Assessment   Medical Diagnosis Lt ACL repair    Referring Provider (PT) Marlou Sa Tonna Corner, MD      Strength   Overall Strength Comments Functional strength assessment using single leg reach. Rt leg 16.67" inch, Lt leg 15.17" = 91%    Left Knee Flexion 5/5    Left Knee Extension 5/5                         [facility] Adult PT Treatment/Exercise - 08/17/20 1506      Ambulation/Gait   Gait Comments descending stairs with noted decreased eccentric control on LLE      Knee/Hip Exercises: Stretches   Quad Stretch Left;3 reps;60 seconds    Quad Stretch Limitations prone with strap      Knee/Hip Exercises: Aerobic   Nustep L7 x 8 min; LEs only  Knee/Hip Exercises: Standing   Forward Lunges Both;10 reps    Forward Lunges Limitations 5# DB bil    Side Lunges 10 reps;Both    Side Lunges Limitations 5# bil DB    Functional Squat 10 reps    Functional Squat Limitations 5# DB bil    SLS LLE: >30 sec on level ground; 20 sec on compliant surface      Knee/Hip Exercises: Supine   Single Leg Bridge Left;20 reps                    PT Short Term Goals - 07/08/20 1651      PT SHORT TERM GOAL #1   Title improve Lt knee AROM 0-100 for improved function    Time 4    Period Weeks    Status Achieved    Target Date 07/20/20      PT SHORT TERM GOAL #2   Title amb without AD independently for improved function    Status Achieved    Target Date 07/20/20      PT SHORT TERM GOAL #3   Title report pain < 2/10 with amb for improved mobility    Status  Achieved    Target Date 07/20/20             PT Long Term Goals - 08/17/20 1549      PT LONG TERM GOAL #1   Title increase Gross LLE strength to >/= 4+/5 to promote knee stability with walking/ standing    Status Achieved      PT LONG TERM GOAL #2   Title improve Lt knee AROM 0-120 for improved function and mobility    Time 6    Status Achieved      PT LONG TERM GOAL #3   Title pt to be able to perform SLS balance for >/= 30 sec with no report of instability to promote stability    Status Achieved      PT LONG TERM GOAL #4   Title pt to be I with all HEP given to maintain and progress current level of function    Status On-going    Target Date 09/28/20      PT LONG TERM GOAL #5   Title negotiate stairs and gait on various surfaces independently without deviations for imrpoved community access    Status Revised    Target Date 09/28/20            New Goals:    PT Long Term Goals - 08/17/20 1552      PT LONG TERM GOAL #1   Title demonstrate improved single leg reach test to >/= 95% strength for improved function    Target Date 09/28/20      PT LONG TERM GOAL #2   Title improve LLE balance and strength by standing on compliance surface SLS > 30 sec    Status New    Target Date 09/28/20      PT LONG TERM GOAL #3   Title descend stairs without significant deviations for improved strength and function    Status New    Target Date 09/28/20      PT LONG TERM GOAL #4   Title pt to be I with all HEP given to maintain and progress current level of function    Status On-going    Target Date 09/28/20                Plan - 08/17/20 1550  Clinical Impression Statement Pt has met 3/5 LTGs and partially met gait goal.  Still has some balance and functional strength deficits and will see 1x/wk for up to 6 wks to progress.    Personal Factors and Comorbidities Age;Comorbidity 2    Comorbidities migraines, depression    Examination-Activity Limitations Locomotion  Level;Transfers;Squat;Stairs;Stand;Lift;Hygiene/Grooming;Dressing;Carry;Caring for Others;Bathing    Examination-Participation Restrictions Meal Prep;Occupation;Cleaning;Driving;Valla Leaver [facility]    Stability/Clinical Decision Making Stable/Uncomplicated    Rehab Potential Good    PT Frequency 1x / week   2-3x/wk   PT Duration 6 weeks    PT Treatment/Interventions ADLs/Self Care Home Management;Cryotherapy;Electrical Stimulation;Iontophoresis 79m/ml Dexamethasone;Moist Heat;Ultrasound;Therapeutic activities;Therapeutic exercise;Balance training;Neuromuscular re-education;Patient/family education;Manual techniques;Dry needling;Taping;Vasopneumatic Device;Passive range of motion;Stair training;Gait training    PT Next Visit Plan continue strengthening    PT Home Exercise Plan [email or web address] - SLR 4 way, quad set, heel slides    Consulted and Agree with Plan of Care Patient           Patient will benefit from skilled therapeutic intervention in order to improve the following deficits and impairments:  Abnormal gait, Improper body mechanics, Increased muscle spasms, Decreased strength, Pain, Decreased activity tolerance, Decreased balance, Decreased endurance, Difficulty walking, Decreased range of motion, Decreased mobility, Increased edema  Visit Diagnosis: Chronic pain of left knee  Muscle weakness (generalized)  Localized edema  Other abnormalities of gait and mobility  Stiffness of left knee, not elsewhere classified     Problem List Patient Active Problem List   Diagnosis Date Noted  . 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 Right carpal tunnel syndrome 12/06/2019  . Xerosis cutis 10/16/2019  . Greater trochanteric bursitis of both hips 08/09/2018  . Nicotine vapor product user 08/09/2018  . Erythrocytosis 08/09/2018  . Greater trochanteric bursitis of right hip 02/06/2017  . Chronic right SI joint pain 02/06/2017  . Visit for screening mammogram 12/13/2016  . Family  history of premature CAD 12/13/2016  . Radiculitis of left cervical region 03/23/2016  . 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 Routine general medical examination at a health care facility 03/22/2013  . Allergic rhinitis, cause unspecified 03/22/2013  . Depression with anxiety 10/31/2011      SLaureen Abrahams PT, DPT 08/17/20 3:54 PM    [facility] [address] [address]Phone: [phone]  Fax:  [phone] Name: BCARIN SHIPPMRN: [id number] of Birth: 212/28/1977

## 2020-08-17 NOTE — Patient Instructions (Signed)
Access Code: [email or web address] URL: [email or web address] Date: 08/17/2020 Prepared by: Moshe Cipro  Exercises Single Leg Bridge - 1 x daily - 7 x weekly - 3 sets - 10 reps Single Leg Deadlift with Kettlebell - 1 x daily - 7 x weekly - 3 sets - 10 reps Sumo Squat with Dumbbell - 1 x daily - 7 x weekly - 3 sets - 10 reps Lateral Lunge - 1 x daily - 7 x weekly - 3 sets - 10 reps Standard Lunge - 1 x daily - 7 x weekly - 3 sets - 10 reps

## 2020-08-18 ENCOUNTER — Encounter: Payer: 59 | Admitting: Physical Therapy

## 2020-08-20 ENCOUNTER — Encounter: Payer: 59 | Admitting: Physical Therapy

## 2020-08-21 ENCOUNTER — Other Ambulatory Visit: Payer: Self-pay

## 2020-08-21 ENCOUNTER — Ambulatory Visit (INDEPENDENT_AMBULATORY_CARE_PROVIDER_SITE_OTHER): Payer: 59 | Admitting: Orthopedic Surgery

## 2020-08-21 ENCOUNTER — Telehealth: Payer: Self-pay

## 2020-08-21 DIAGNOSIS — Z9889 Other specified postprocedural states: Secondary | ICD-10-CM

## 2020-08-21 DIAGNOSIS — R200 Anesthesia of skin: Secondary | ICD-10-CM

## 2020-08-21 NOTE — Telephone Encounter (Signed)
Patient was seen by Dr August Saucer this morning.  She is scheduled for EMG/NCV with Dr Alvester Morin on 09/04/20.  Dr August Saucer would like eval of right ulnar nerve to this study.

## 2020-08-23 ENCOUNTER — Encounter: Payer: Self-pay | Admitting: Orthopedic Surgery

## 2020-08-23 NOTE — Progress Notes (Signed)
Post-Op Visit Note   Patient: Danielle Cook           Date of Birth: [birth year]           MRN: [id number] Visit Date: 08/21/2020 PCP: Etta Grandchild, MD   Assessment & Plan:  Chief Complaint:  Chief Complaint  Patient presents with  . Left Knee - Follow-up   Visit Diagnoses:  1. S/P left knee arthroscopy     Plan: Patient is a 44 year old female presents s/p left knee ACL reconstruction on 06/11/2020.  Patient notes that she is doing well and has no significant difficulties in her daily life.  She has returned to work and started a new job.  She states that she is doing exceptionally at work and denies any instability events.  She is going to physical therapy 1 time per week where they are working using the stationary bike and walking, leg press, lunges.  She has excellent range of motion from 0 to 120 degrees.  Incisions have healed well.  Graft is stable on Lachman exam today.  She has excellent quadricep and hamstring strength that is comparable with the contralateral side.  Plan to continue physical therapy 1 time per week for 4 more weeks.  Transition to home exercise program is appropriate at that time.  She does note continued numbness and tingling in the right wrist and hand.  This was previously in the second and third fingers but now she reports it is in her third and fourth fingers.  She has a nerve conduction study scheduled on 11/5 with Dr. Alvester Morin.  He symptoms are primarily in her right wrist and hand and worse at night.  She does have positive Tinel's sign over the right wrist on exam today as well as a subluxing ulnar nerve on the right side.  Plan to evaluate patient for carpal tunnel syndrome as well as ulnar neuropathy on 11/5.  Follow-up after study to review results.  Patient agreed with plan.  Follow-Up Instructions: No follow-ups on file.   Orders:  Orders Placed This Encounter  Procedures  . Ambulatory referral to Physical Therapy   No orders of the  defined types were placed in this encounter.   Imaging: No results found.  PMFS History: Patient Active Problem List   Diagnosis Date Noted  . 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 Right carpal tunnel syndrome 12/06/2019  . Xerosis cutis 10/16/2019  . Greater trochanteric bursitis of both hips 08/09/2018  . Nicotine vapor product user 08/09/2018  . Erythrocytosis 08/09/2018  . Greater trochanteric bursitis of right hip 02/06/2017  . Chronic right SI joint pain 02/06/2017  . Visit for screening mammogram 12/13/2016  . Family history of premature CAD 12/13/2016  . Radiculitis of left cervical region 03/23/2016  . 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 Routine general medical examination at a health care facility 03/22/2013  . Allergic rhinitis, cause unspecified 03/22/2013  . Depression with anxiety 10/31/2011   Past Medical History:  Diagnosis Date  . Abnormal Pap smear   . Allergy   . Complication of anesthesia   . History of chicken pox   . 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 Hyperlipidemia   . Migraine   . PONV (postoperative nausea and vomiting)   . PPD positive, treated 2004    Family History  Problem Relation Age of Onset  . Stroke Mother   . Dementia Mother   . Hyperlipidemia Mother   .  Early death Father   . Heart disease Father   . Hyperlipidemia Father   . Cancer Neg Hx   . Depression Neg Hx   . Diabetes Neg Hx   . Drug abuse Neg Hx   . Hearing loss Neg Hx   . Hypertension Neg Hx     Past Surgical History:  Procedure Laterality Date  . ANTERIOR CRUCIATE LIGAMENT REPAIR Left 06/11/2020   Procedure: LEFT KNEE ANTERIOR CRUCIATE LIGAMENT (ACL) RECONSTRUCTION WITH WITH QUAD ALLOGRAFT, MENISCAL REPAIR vs DEBRIDEMENT;  Surgeon: Cammy Copa, MD;  Location: [facility] OR;  Service: Orthopedics;  Laterality: Left;  . CHOLECYSTECTOMY, LAPAROSCOPIC  07/13/2000  . COLPOSCOPY  2000,   . TONSILLECTOMY    . WISDOM TOOTH EXTRACTION  1995 Jan   Social History    Occupational History  . Not on file  Tobacco Use  . Smoking status: Current Every Day Smoker    Packs/day: 0.25    Years: 15.00    Pack years: 3.75    Types: E-cigarettes  . Smokeless tobacco: Never Used  Substance and Sexual Activity  . Alcohol use: No    Comment: few glasses of wine during pregnancy  . Drug use: No  . Sexual activity: Yes    Partners: Male

## 2020-08-24 ENCOUNTER — Other Ambulatory Visit: Payer: Self-pay

## 2020-08-24 ENCOUNTER — Ambulatory Visit (INDEPENDENT_AMBULATORY_CARE_PROVIDER_SITE_OTHER): Payer: 59 | Admitting: Internal Medicine

## 2020-08-24 ENCOUNTER — Encounter: Payer: Self-pay | Admitting: Internal Medicine

## 2020-08-24 ENCOUNTER — Ambulatory Visit: Payer: 59 | Admitting: Physical Therapy

## 2020-08-24 VITALS — BP 144/88 | HR 64 | Temp 98.4°F | Resp 16 | Ht 64.0 in | Wt 231.0 lb

## 2020-08-24 DIAGNOSIS — R2689 Other abnormalities of gait and mobility: Secondary | ICD-10-CM

## 2020-08-24 DIAGNOSIS — E876 Hypokalemia: Secondary | ICD-10-CM

## 2020-08-24 DIAGNOSIS — R600 Localized edema: Secondary | ICD-10-CM

## 2020-08-24 DIAGNOSIS — M25562 Pain in left knee: Secondary | ICD-10-CM | POA: Diagnosis not present

## 2020-08-24 DIAGNOSIS — Z1159 Encounter for screening for other viral diseases: Secondary | ICD-10-CM | POA: Insufficient documentation

## 2020-08-24 DIAGNOSIS — Z1231 Encounter for screening mammogram for malignant neoplasm of breast: Secondary | ICD-10-CM

## 2020-08-24 DIAGNOSIS — R82998 Other abnormal findings in urine: Secondary | ICD-10-CM | POA: Diagnosis not present

## 2020-08-24 DIAGNOSIS — Z0000 Encounter for general adult medical examination without abnormal findings: Secondary | ICD-10-CM | POA: Diagnosis not present

## 2020-08-24 DIAGNOSIS — M6281 Muscle weakness (generalized): Secondary | ICD-10-CM

## 2020-08-24 DIAGNOSIS — M25662 Stiffness of left knee, not elsewhere classified: Secondary | ICD-10-CM

## 2020-08-24 DIAGNOSIS — G8929 Other chronic pain: Secondary | ICD-10-CM

## 2020-08-24 DIAGNOSIS — Z8249 Family history of ischemic heart disease and other diseases of the circulatory system: Secondary | ICD-10-CM | POA: Diagnosis not present

## 2020-08-24 DIAGNOSIS — I10 Essential (primary) hypertension: Secondary | ICD-10-CM | POA: Diagnosis not present

## 2020-08-24 LAB — MICROALBUMIN / CREATININE URINE RATIO
Creatinine,U: 131.6 mg/dL
Microalb Creat Ratio: 0.5 mg/g (ref 0.0–30.0)
Microalb, Ur: <0.7 mg/dL (ref 0.0–1.9)

## 2020-08-24 LAB — URINALYSIS, ROUTINE W REFLEX MICROSCOPIC
Bilirubin Urine: NEGATIVE
Ketones, ur: NEGATIVE
Leukocytes,Ua: NEGATIVE
Nitrite: NEGATIVE
Specific Gravity, Urine: >=1.03 — AB (ref 1.000–1.030)
Total Protein, Urine: NEGATIVE
Urine Glucose: NEGATIVE
Urobilinogen, UA: 0.2 (ref 0.0–1.0)
pH: 5.5 (ref 5.0–8.0)

## 2020-08-24 LAB — BASIC METABOLIC PANEL
BUN: 11 mg/dL (ref 6–23)
CO2: 29 mEq/L (ref 19–32)
Calcium: 9.4 mg/dL (ref 8.4–10.5)
Chloride: 103 mEq/L (ref 96–112)
Creatinine, Ser: 0.64 mg/dL (ref 0.40–1.20)
GFR: 107.28 mL/min (ref >60.00)
Glucose, Bld: 86 mg/dL (ref 70–99)
Potassium: 4.2 mEq/L (ref 3.5–5.1)
Sodium: 139 mEq/L (ref 135–145)

## 2020-08-24 LAB — LIPID PANEL
Cholesterol: 235 mg/dL — ABNORMAL HIGH (ref 0–200)
HDL: 43.5 mg/dL (ref >39.00)
LDL Cholesterol: 162 mg/dL — ABNORMAL HIGH (ref 0–99)
NonHDL: 191.53
Total CHOL/HDL Ratio: 5
Triglycerides: 147 mg/dL (ref 0.0–149.0)
VLDL: 29.4 mg/dL (ref 0.0–40.0)

## 2020-08-24 LAB — TSH: TSH: 2.1 u[IU]/mL (ref 0.35–4.50)

## 2020-08-24 LAB — MAGNESIUM: Magnesium: 1.8 mg/dL (ref 1.5–2.5)

## 2020-08-24 NOTE — Therapy (Signed)
[facility] [address] [place], [place], [address] Phone: [phone]   Fax:  [phone]  Physical Therapy Treatment  Patient Details  Name: Danielle Cook MRN: [id number] Date of Birth: 1975/11/05 Referring Provider (PT): Marlou Sa Tonna Corner, MD   Encounter Date: 08/24/2020   PT End of Session - 08/24/20 1629    Visit Number 18    Number of Visits 23    Date for PT Re-Evaluation 09/28/20    PT Start Time 3382    PT Stop Time 1600    PT Time Calculation (min) 45 min    Activity Tolerance Patient tolerated treatment well    Behavior During Therapy [facility] for tasks assessed/performed           Past Medical History:  Diagnosis Date  . Abnormal Pap smear   . Allergy   . Complication of anesthesia   . History of chicken pox   . 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 Hyperlipidemia   . Migraine   . PONV (postoperative nausea and vomiting)   . PPD positive, treated 2004    Past Surgical History:  Procedure Laterality Date  . ANTERIOR CRUCIATE LIGAMENT REPAIR Left 06/11/2020   Procedure: LEFT KNEE ANTERIOR CRUCIATE LIGAMENT (ACL) RECONSTRUCTION WITH WITH QUAD ALLOGRAFT, MENISCAL REPAIR vs DEBRIDEMENT;  Surgeon: Meredith Pel, MD;  Location: [place];  Service: Orthopedics;  Laterality: Left;  . CHOLECYSTECTOMY, LAPAROSCOPIC  07/13/2000  . COLPOSCOPY  2000,   . TONSILLECTOMY    . [place] Jan    There were no vitals filed for this visit.   Subjective Assessment - 08/24/20 1622    Subjective relays more pain in the back of her leg today since she saw MD last, pain about 3-4 and more when she is walking    Limitations Standing;Lifting;Walking    How long can you sit comfortably? unlimited    How long can you stand comfortably? unlimited with brace on    How long can you walk comfortably? unlimited with brace on    Diagnostic tests 02/29/2020 IMPRESSION:1. Complete tear of the anterior cruciate ligament.2. Small  longitudinal tear of the undersurface of the periphery ofthe posterior horn of the medial meniscus.3. Slight sprains of the femoral attachments of the medial andlateral collateral ligaments.4. Prominent joint effusion.5. 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    Patient Stated Goals get back to work, return to regular activities    Pain Onset 1 to 4 weeks ago                             [facility] Adult PT Treatment/Exercise - 08/24/20 0001      Knee/Hip Exercises: Stretches   Passive Hamstring Stretch Left;3 reps;30 seconds      Knee/Hip Exercises: Aerobic   Recumbent Bike L7 x 8 min      Knee/Hip Exercises: Machines for Strengthening   Cybex Knee Extension up with both, down with Lt only 10 lbs 3X10    Cybex Knee Flexion Lt leg only 25 lbs 3x10    Total Gym Leg Press LLE only 87# 3x10      Knee/Hip Exercises: Standing   Other Standing Knee Exercises lunge series of 4 reps 1/4 range at top, 4 reps 1/4 range at bottom, 4 reps full range. X 2 sets bilat with one UE support      Manual Therapy   Manual therapy comments prone hang  with STM/IASTM to hamstrings, supine passive hamstring stretching MET contract-relax.                    PT Short Term Goals - 07/08/20 1651      PT SHORT TERM GOAL #1   Title improve Lt knee AROM 0-100 for improved function    Time 4    Period Weeks    Status Achieved    Target Date 07/20/20      PT SHORT TERM GOAL #2   Title amb without AD independently for improved function    Status Achieved    Target Date 07/20/20      PT SHORT TERM GOAL #3   Title report pain < 2/10 with amb for improved mobility    Status Achieved    Target Date 07/20/20             PT Long Term Goals - 08/17/20 1552      PT LONG TERM GOAL #1   Title demonstrate improved single leg reach test to >/= 95% strength for improved function    Target Date 09/28/20      PT LONG TERM GOAL #2   Title improve LLE balance and strength  by standing on compliance surface SLS > 30 sec    Status New    Target Date 09/28/20      PT LONG TERM GOAL #3   Title descend stairs without significant deviations for improved strength and function    Status New    Target Date 09/28/20      PT LONG TERM GOAL #4   Title pt to be I with all HEP given to maintain and progress current level of function    Status On-going    Target Date 09/28/20                 Plan - 08/24/20 1629    Clinical Impression Statement Due to more hamstring related pain, focused more on stretching and manual therapy for this. She did relay her leg felt better after session. Continue POC    Personal Factors and Comorbidities Age;Comorbidity 2    Comorbidities migraines, depression    Examination-Activity Limitations Locomotion Level;Transfers;Squat;Stairs;Stand;Lift;Hygiene/Grooming;Dressing;Carry;Caring for Others;Bathing    Examination-Participation Restrictions Meal Prep;Occupation;Cleaning;Driving;Valla Leaver [facility]    Stability/Clinical Decision Making Stable/Uncomplicated    Rehab Potential Good    PT Frequency 1x / week   2-3x/wk   PT Duration 6 weeks    PT Treatment/Interventions ADLs/Self Care Home Management;Cryotherapy;Electrical Stimulation;Iontophoresis 45m/ml Dexamethasone;Moist Heat;Ultrasound;Therapeutic activities;Therapeutic exercise;Balance training;Neuromuscular re-education;Patient/family education;Manual techniques;Dry needling;Taping;Vasopneumatic Device;Passive range of motion;Stair training;Gait training    PT Next Visit Plan continue strengthening    PT Home Exercise Plan [id number] - SLR 4 way, quad set, heel slides    Consulted and Agree with Plan of Care Patient           Patient will benefit from skilled therapeutic intervention in order to improve the following deficits and impairments:  Abnormal gait, Improper body mechanics, Increased muscle spasms, Decreased strength, Pain, Decreased activity tolerance, Decreased  balance, Decreased endurance, Difficulty walking, Decreased range of motion, Decreased mobility, Increased edema  Visit Diagnosis: Chronic pain of left knee  Muscle weakness (generalized)  Localized edema  Other abnormalities of gait and mobility  Stiffness of left knee, not elsewhere classified     Problem List Patient Active Problem List   Diagnosis Date Noted  . Hypokalemia 08/24/2020  . Need for hepatitis C screening test 08/24/2020  . Primary hypertension  08/24/2020  . High urine creatine 08/24/2020  . 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 Right carpal tunnel syndrome 12/06/2019  . Xerosis cutis 10/16/2019  . Greater trochanteric bursitis of both hips 08/09/2018  . Nicotine vapor product user 08/09/2018  . Greater trochanteric bursitis of right hip 02/06/2017  . Chronic right SI joint pain 02/06/2017  . Visit for screening mammogram 12/13/2016  . Family history of premature CAD 12/13/2016  . Radiculitis of left cervical region 03/23/2016  . 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 Routine general medical examination at a health care facility 03/22/2013  . Allergic rhinitis, cause unspecified 03/22/2013  . Depression with anxiety 10/31/2011    Silvestre Mesi 08/24/2020, 4:31 PM  [facility] [address] [place], [place], [address] Phone: [phone]   Fax:  [phone]  Name: SAHER DAVEE MRN: [id number] Date of Birth: [birth year]

## 2020-08-24 NOTE — Patient Instructions (Signed)

## 2020-08-24 NOTE — Progress Notes (Signed)
Subjective:  Patient ID: Danielle Cook, female    DOB: 1975/11/26  Age: 44 y.o. MRN: [id number]  CC: Annual Exam  This visit occurred during the SARS-CoV-2 public health emergency.  Safety protocols were in place, including screening questions prior to the visit, additional usage of staff PPE, and extensive cleaning of exam room while observing appropriate contact time as indicated for disinfecting solutions.    HPI Danielle Cook presents for a CPX.  She started a new job about 3 weeks ago.  As part of the onboarding process she said she underwent a urine drug screen and was told that her urinary creatinine level was elevated.  She has a family history of hypertension.  She is recently been taking an NSAID for osteoarthritis.  She denies headache, blurred vision, chest pain, shortness of breath, palpitations, edema, or fatigue.  She is active and denies DOE.  She had lab work done elsewhere about 2 months ago and had mild hypokalemia.  Outpatient Medications Prior to Visit  Medication Sig Dispense Refill   augmented betamethasone dipropionate (DIPROLENE AF) 0.05 % cream Apply topically 2 (two) times daily. (Patient taking differently: Apply 1 application topically 2 (two) times daily as needed (dry skin). ) 30 g 0   escitalopram (LEXAPRO) 20 MG tablet TAKE 1 TABLET BY MOUTH EVERY DAY 30 tablet 5   gabapentin (NEURONTIN) 100 MG capsule Take 2 capsules (200 mg total) by mouth at bedtime. (Patient taking differently: Take 100-300 mg by mouth See admin instructions. 100 mg in the morning if needed, 300 mg at bedtime) 180 capsule 3   levonorgestrel (MIRENA, 52 MG,) 20 MCG/24HR IUD 1 Intra Uterine Device (1 each total) by Intrauterine route once. 1 each 0   Lifitegrast (XIIDRA) 5 % SOLN Place 1 drop into both eyes in the morning and at bedtime.     meloxicam (MOBIC) 15 MG tablet TAKE 1 TABLET BY MOUTH EVERY DAY (Patient taking differently: Take 15 mg by mouth daily as needed for pain. )  30 tablet 11   methocarbamol (ROBAXIN) 500 MG tablet Take 1 tablet (500 mg total) by mouth every 8 (eight) hours as needed for muscle spasms. 30 tablet 0   Misc Natural Products (ELDERBERRY ZINC/VIT C/IMMUNE MT) Take 1 tablet by mouth daily.     orphenadrine (NORFLEX) 100 MG tablet Take 1 tablet (100 mg total) by mouth 2 (two) times daily as needed for muscle spasms. 40 tablet 0   Vitamin D, Ergocalciferol, (DRISDOL) 1.25 MG (50000 UNIT) CAPS capsule TAKE 1 CAPSULE BY MOUTH EVERY 7 DAYS (Patient taking differently: Take 50,000 Units by mouth every Tuesday. ) 4 capsule 2   [facility] ASPIRIN ADULT LOW DOSE 81 MG chewable tablet CHEW AND SWALLOW 1 TABLET DAILY 90 tablet 1   oxyCODONE (OXY IR/ROXICODONE) 5 MG immediate release tablet Take 1 tablet (5 mg total) by mouth every 6 (six) hours as needed for severe pain. 30 tablet 0   No facility-administered medications prior to visit.    ROS Review of Systems  Constitutional: Negative.  Negative for appetite change, diaphoresis, fatigue and unexpected weight change.  HENT: Negative.   Eyes: Negative for visual disturbance.  Respiratory: Negative for apnea, choking, shortness of breath and wheezing.   Cardiovascular: Negative for chest pain, palpitations and leg swelling.  Gastrointestinal: Negative for abdominal pain, constipation, diarrhea, nausea and vomiting.  Genitourinary: Negative.  Negative for decreased urine volume, difficulty urinating, dysuria, frequency, hematuria and urgency.  Musculoskeletal: Positive for arthralgias. Negative for  neck pain.  Skin: Negative.  Negative for color change and pallor.  Neurological: Negative.  Negative for dizziness, weakness, light-headedness and headaches.  Hematological: Negative for adenopathy. Does not bruise/bleed easily.  Psychiatric/Behavioral: Negative.     Objective:  BP (!) 144/88    Pulse 64    Temp 98.4 F (36.9 C) (Oral)    Resp 16    Ht 5\' 4"  (1.626 m)    Wt 231 lb (104.8 kg)    SpO2 97%     BMI 39.65 kg/m   BP Readings from Last 3 Encounters:  08/24/20 (!) 144/88  06/11/20 140/82  06/04/20 (!) 145/89    Wt Readings from Last 3 Encounters:  08/24/20 231 lb (104.8 kg)  06/11/20 236 lb (107 kg)  06/04/20 236 lb (107 kg)    Physical Exam Vitals reviewed.  Constitutional:      Appearance: Normal appearance.  HENT:     Nose: Nose normal.     Mouth/Throat:     Mouth: Mucous membranes are moist.  Eyes:     General: No scleral icterus.    Conjunctiva/sclera: Conjunctivae normal.  Cardiovascular:     Rate and Rhythm: Normal rate and regular rhythm.     Heart sounds: No murmur heard.   Pulmonary:     Effort: Pulmonary effort is normal.     Breath sounds: No stridor. No wheezing, rhonchi or rales.  Abdominal:     General: Abdomen is flat.     Palpations: There is no mass.     Tenderness: There is no abdominal tenderness. There is no guarding.  Musculoskeletal:        General: Normal range of motion.     Cervical back: Neck supple.     Right lower leg: No edema.     Left lower leg: No edema.  Lymphadenopathy:     Cervical: No cervical adenopathy.  Skin:    General: Skin is warm and dry.  Neurological:     General: No focal deficit present.     Mental Status: She is alert.  Psychiatric:        Mood and Affect: Mood normal.        Behavior: Behavior normal.     Lab Results  Component Value Date   WBC 7.6 06/04/2020   HGB 13.7 06/04/2020   HCT 41.5 06/04/2020   PLT 204 06/04/2020   GLUCOSE 86 08/24/2020   CHOL 235 (H) 08/24/2020   TRIG 147.0 08/24/2020   HDL 43.50 08/24/2020   LDLCALC 162 (H) 08/24/2020   ALT 16 08/13/2019   AST 16 08/13/2019   NA 139 08/24/2020   K 4.2 08/24/2020   CL 103 08/24/2020   CREATININE 0.64 08/24/2020   BUN 11 08/24/2020   CO2 29 08/24/2020   TSH 2.10 08/24/2020   MICROALBUR <0.7 08/24/2020    No results found.  Assessment & Plan:   Danielle Cook was seen today for annual exam.  Diagnoses and all orders for this  visit:  Routine general medical examination at a health care facility- Exam completed, labs reviewed, vaccines reviewed and updated, cancer screenings addressed, patient education was given. -     Lipid panel; Future -     Lipid panel  Hypokalemia- Her potassium level is normal now. -     Magnesium; Future -     Basic metabolic panel; Future -     Basic metabolic panel -     Magnesium  Need for hepatitis C screening test -  Hepatitis C antibody; Future -     Hepatitis C antibody  Primary hypertension- She has developed stage I hypertension.  Her labs are negative for secondary causes or endorgan damage.  She is not willing to start an antihypertensive at this time.  She agrees to improve her lifestyle modifications. -     Urinalysis, Routine w reflex microscopic; Future -     TSH; Future -     Microalbumin / creatinine urine ratio; Future -     Microalbumin / creatinine urine ratio -     TSH -     Urinalysis, Routine w reflex microscopic  High urine creatine- Her urinary creatinine is normal on today's testing. -     Microalbumin / creatinine urine ratio; Future -     Microalbumin / creatinine urine ratio  Visit for screening mammogram -     MM DIGITAL SCREENING BILATERAL; Future  Family history of premature CAD- She has a low ASCVD risk score so I did not recommend a statin for CV risk reduction.   I have discontinued Danielle Cook. Madry's oxyCODONE and [facility]S Aspirin Adult Low Dose. I am also having her maintain her levonorgestrel, augmented betamethasone dipropionate, gabapentin, Vitamin D (Ergocalciferol), meloxicam, Misc Natural Products (ELDERBERRY ZINC/VIT C/IMMUNE MT), Xiidra, methocarbamol, orphenadrine, and escitalopram.  No orders of the defined types were placed in this encounter.    Follow-up: Return in about 3 months (around 11/24/2020).  Sanda Linger, MD

## 2020-08-24 NOTE — Telephone Encounter (Signed)
Added to appointment note

## 2020-08-25 ENCOUNTER — Encounter: Payer: 59 | Admitting: Physical Therapy

## 2020-08-25 ENCOUNTER — Encounter: Payer: Self-pay | Admitting: Internal Medicine

## 2020-08-25 LAB — HEPATITIS C ANTIBODY
Hepatitis C Ab: NONREACTIVE
SIGNAL TO CUT-OFF: 0.01 (ref <1.00)

## 2020-08-27 ENCOUNTER — Encounter: Payer: 59 | Admitting: Physical Therapy

## 2020-08-31 ENCOUNTER — Other Ambulatory Visit: Payer: Self-pay

## 2020-08-31 ENCOUNTER — Encounter: Payer: 59 | Admitting: Physical Therapy

## 2020-08-31 ENCOUNTER — Encounter: Payer: Self-pay | Admitting: Physical Therapy

## 2020-08-31 ENCOUNTER — Telehealth: Payer: Self-pay | Admitting: Physical Therapy

## 2020-08-31 NOTE — Telephone Encounter (Signed)
LVM for pt as she NS for PT appt.  Reminded of next appt time and to call with questions.  Clarita Crane, PT, DPT 08/31/20 12:05 PM

## 2020-09-04 ENCOUNTER — Ambulatory Visit: Payer: 59 | Admitting: Physical Medicine and Rehabilitation

## 2020-09-04 ENCOUNTER — Other Ambulatory Visit: Payer: Self-pay

## 2020-09-04 ENCOUNTER — Encounter: Payer: Self-pay | Admitting: Physical Medicine and Rehabilitation

## 2020-09-04 DIAGNOSIS — R202 Paresthesia of skin: Secondary | ICD-10-CM | POA: Diagnosis not present

## 2020-09-04 NOTE — Progress Notes (Signed)
Numbness in fingers of right hand. Had an injection from sports medicine which helped. Third and fourth fingers are worse.  Right hand dominant No lotion per patient

## 2020-09-07 ENCOUNTER — Other Ambulatory Visit: Payer: Self-pay

## 2020-09-07 ENCOUNTER — Encounter: Payer: Self-pay | Admitting: Physical Therapy

## 2020-09-07 ENCOUNTER — Ambulatory Visit: Payer: 59 | Admitting: Physical Therapy

## 2020-09-07 DIAGNOSIS — R600 Localized edema: Secondary | ICD-10-CM

## 2020-09-07 DIAGNOSIS — M6281 Muscle weakness (generalized): Secondary | ICD-10-CM

## 2020-09-07 DIAGNOSIS — R2689 Other abnormalities of gait and mobility: Secondary | ICD-10-CM

## 2020-09-07 DIAGNOSIS — M25562 Pain in left knee: Secondary | ICD-10-CM

## 2020-09-07 DIAGNOSIS — G8929 Other chronic pain: Secondary | ICD-10-CM

## 2020-09-07 DIAGNOSIS — M25662 Stiffness of left knee, not elsewhere classified: Secondary | ICD-10-CM

## 2020-09-07 NOTE — Therapy (Signed)
[facility] Physical Therapy [address] [place], [place], [address] Phone: [phone]   Fax:  [phone]  Physical Therapy Treatment  Patient Details  Name: Danielle Cook MRN: [id number] Date of Birth: May 24, 1976 Referring Provider (PT): August Saucer Corrie Mckusick, MD   Encounter Date: 09/07/2020   PT End of Session - 09/07/20 1236    Visit Number 19    Number of Visits 23    Date for PT Re-Evaluation 09/28/20    PT Start Time 1146    PT Stop Time 1227    PT Time Calculation (min) 41 min    Activity Tolerance Patient tolerated treatment well    Behavior During Therapy [facility] for tasks assessed/performed           Past Medical History:  Diagnosis Date  . Abnormal Pap smear   . Allergy   . Complication of anesthesia   . History of chicken pox   . 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 Hyperlipidemia   . Migraine   . PONV (postoperative nausea and vomiting)   . PPD positive, treated 2004    Past Surgical History:  Procedure Laterality Date  . ANTERIOR CRUCIATE LIGAMENT REPAIR Left 06/11/2020   Procedure: LEFT KNEE ANTERIOR CRUCIATE LIGAMENT (ACL) RECONSTRUCTION WITH WITH QUAD ALLOGRAFT, MENISCAL REPAIR vs DEBRIDEMENT;  Surgeon: Cammy Copa, MD;  Location: [facility] OR;  Service: Orthopedics;  Laterality: Left;  . CHOLECYSTECTOMY, LAPAROSCOPIC  07/13/2000  . COLPOSCOPY  2000,   . TONSILLECTOMY    . WISDOM TOOTH EXTRACTION  1995 Jan    There were no vitals filed for this visit.   Subjective Assessment - 09/07/20 1149    Subjective Lt knee is really hurting    Limitations Standing;Lifting;Walking    How long can you sit comfortably? unlimited    How long can you stand comfortably? unlimited with brace on    How long can you walk comfortably? unlimited with brace on    Diagnostic tests 02/29/2020 IMPRESSION:1. Complete tear of the anterior cruciate ligament.2. Small longitudinal tear of the undersurface of the periphery ofthe posterior horn of the medial  meniscus.3. Slight sprains of the femoral attachments of the medial andlateral collateral ligaments.4. Prominent joint effusion.5. 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    Patient Stated Goals get back to work, return to regular activities    Currently in Pain? Yes    Pain Location Knee    Pain Orientation Left    Pain Descriptors / Indicators Aching;Sore    Pain Type Chronic pain    Pain Onset 1 to 4 weeks ago    Pain Frequency Constant    Aggravating Factors  standing    Pain Relieving Factors ice and rest                             [facility] Adult PT Treatment/Exercise - 09/07/20 1150      Knee/Hip Exercises: Stretches   Passive Hamstring Stretch Left;5 reps;30 seconds    Passive Hamstring Stretch Limitations supine with strap    Gastroc Stretch Left;5 reps;30 seconds    Gastroc Stretch Limitations slantboard      Knee/Hip Exercises: Aerobic   Recumbent Bike L3 x 8 min      Knee/Hip Exercises: Seated   Other Seated Knee/Hip Exercises Lt seated SLR 3x10                    PT Short Term Goals -  07/08/20 1651      PT SHORT TERM GOAL #1   Title improve Lt knee AROM 0-100 for improved function    Time 4    Period Weeks    Status Achieved    Target Date 07/20/20      PT SHORT TERM GOAL #2   Title amb without AD independently for improved function    Status Achieved    Target Date 07/20/20      PT SHORT TERM GOAL #3   Title report pain < 2/10 with amb for improved mobility    Status Achieved    Target Date 07/20/20             PT Long Term Goals - 08/17/20 1552      PT LONG TERM GOAL #1   Title demonstrate improved single leg reach test to >/= 95% strength for improved function    Target Date 09/28/20      PT LONG TERM GOAL #2   Title improve LLE balance and strength by standing on compliance surface SLS > 30 sec    Status New    Target Date 09/28/20      PT LONG TERM GOAL #3   Title descend stairs without  significant deviations for improved strength and function    Status New    Target Date 09/28/20      PT LONG TERM GOAL #4   Title pt to be I with all HEP given to maintain and progress current level of function    Status On-going    Target Date 09/28/20                 Plan - 09/07/20 1236    Clinical Impression Statement Pt reports elevated pain in Lt knee today with some increased swelling noted.  She's been overly stressed recently and also with new work position so increased activity over the past few weeks.  Pain improved by end of session with stretching.  Will continue to benefit from PT to maximize function.    Personal Factors and Comorbidities Age;Comorbidity 2    Comorbidities migraines, depression    Examination-Activity Limitations Locomotion Level;Transfers;Squat;Stairs;Stand;Lift;Hygiene/Grooming;Dressing;Carry;Caring for Others;Bathing    Examination-Participation Restrictions Meal Prep;Occupation;Cleaning;Driving;[facility]    Stability/Clinical Decision Making Stable/Uncomplicated    Rehab Potential Good    PT Frequency 1x / week   2-3x/wk   PT Duration 6 weeks    PT Treatment/Interventions ADLs/Self Care Home Management;Cryotherapy;Electrical Stimulation;Iontophoresis 4mg /ml Dexamethasone;Moist Heat;Ultrasound;Therapeutic activities;Therapeutic exercise;Balance training;Neuromuscular re-education;Patient/family education;Manual techniques;Dry needling;Taping;Vasopneumatic Device;Passive range of motion;Stair training;Gait training    PT Next Visit Plan continue strengthening    PT Home Exercise Plan [id number] - SLR 4 way, quad set, heel slides    Consulted and Agree with Plan of Care Patient           Patient will benefit from skilled therapeutic intervention in order to improve the following deficits and impairments:  Abnormal gait, Improper body mechanics, Increased muscle spasms, Decreased strength, Pain, Decreased activity tolerance, Decreased  balance, Decreased endurance, Difficulty walking, Decreased range of motion, Decreased mobility, Increased edema  Visit Diagnosis: Chronic pain of left knee  Muscle weakness (generalized)  Localized edema  Other abnormalities of gait and mobility  Stiffness of left knee, not elsewhere classified     Problem List Patient Active Problem List   Diagnosis Date Noted  . Hypokalemia 08/24/2020  . Need for hepatitis C screening test 08/24/2020  . Primary hypertension 08/24/2020  . High urine creatine 08/24/2020  .  Right carpal tunnel syndrome 12/06/2019  . Xerosis cutis 10/16/2019  . Nicotine vapor product user 08/09/2018  . Visit for screening mammogram 12/13/2016  . Family history of premature CAD 12/13/2016  . Radiculitis of left cervical region 03/23/2016  . 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 Routine general medical examination at a health care facility 03/22/2013  . Allergic rhinitis, cause unspecified 03/22/2013  . Depression with anxiety 10/31/2011      Clarita Crane, PT, DPT 09/07/20 12:38 PM    [facility] Physical Therapy [address] [place], [place], [address] Phone: [phone]   Fax:  [phone]  Name: Danielle Cook MRN: [id number] Date of Birth: 1975/11/15

## 2020-09-07 NOTE — Procedures (Signed)
EMG & NCV Findings: Evaluation of the right median motor nerve showed prolonged distal onset latency (6.7 ms) and reduced amplitude (1.5 mV).  The right median (across palm) sensory nerve showed prolonged distal peak latency (Wrist, 4.6 ms), reduced amplitude (6.3 V), and prolonged distal peak latency (Palm, 2.9 ms).  All remaining nerves (as indicated in the following tables) were within normal limits.    Needle evaluation of the right abductor pollicis brevis muscle showed increased insertional activity.  All remaining muscles (as indicated in the following table) showed no evidence of electrical instability.    Impression: The above electrodiagnostic study is ABNORMAL and reveals evidence of a severe right median nerve entrapment at the wrist (carpal tunnel syndrome) affecting sensory and motor components. The lesion is characterized by sensory and motor demyelination with evidence of injury.   There is no significant electrodiagnostic evidence of any other focal nerve entrapment, brachial plexopathy or cervical radiculopathy.   Recommendations: 1.  Follow-up with referring physician. 2.  Continue current management of symptoms. 3.  Suggest surgical evaluation.  ___________________________ Naaman Plummer FAAPMR Board Certified, American Board of Physical Medicine and Rehabilitation    Nerve Conduction Studies Anti Sensory Summary Table   Stim Site NR Peak (ms) Norm Peak (ms) P-T Amp (V) Norm P-T Amp Site1 Site2 Delta-P (ms) Dist (cm) Vel (m/s) Norm Vel (m/s)  Right Median Acr Palm Anti Sensory (2nd Digit)  31.7C  Wrist    *4.6 <3.6 *6.3 >10 Wrist Palm 1.7 0.0    Palm    *2.9 <2.0 6.3         Right Radial Anti Sensory (Base 1st Digit)  31.7C  Wrist    2.0 <3.1 25.9  Wrist Base 1st Digit 2.0 0.0    Right Ulnar Anti Sensory (5th Digit)  31.8C  Wrist    2.8 <3.7 21.7 >15.0 Wrist 5th Digit 2.8 14.0 50 >38   Motor Summary Table   Stim Site NR Onset (ms) Norm Onset (ms) O-P Amp (mV)  Norm O-P Amp Site1 Site2 Delta-0 (ms) Dist (cm) Vel (m/s) Norm Vel (m/s)  Right Median Motor (Abd Poll Brev)  31.7C    Martin-Gruber  Wrist    *6.7 <4.2 *1.5 >5 Elbow Wrist 0.7 21.0 300 >50  Elbow    7.4  3.5         Right Ulnar Motor (Abd Dig Min)  31.7C  Wrist    2.7 <4.2 6.9 >3 B Elbow Wrist 3.2 20.5 64 >53  B Elbow    5.9  6.6  A Elbow B Elbow 1.1 10.0 91 >53  A Elbow    7.0  6.6          EMG   Side Muscle Nerve Root Ins Act Fibs Psw Amp Dur Poly Recrt Int Dennie Bible Comment  Right Abd Poll Brev Median C8-T1 *Incr Nml Nml Nml Nml 0 Nml Nml   Right 1stDorInt Ulnar C8-T1 Nml Nml Nml Nml Nml 0 Nml Nml   Right PronatorTeres Median C6-7 Nml Nml Nml Nml Nml 0 Nml Nml   Right Biceps Musculocut C5-6 Nml Nml Nml Nml Nml 0 Nml Nml   Right Deltoid Axillary C5-6 Nml Nml Nml Nml Nml 0 Nml Nml     Nerve Conduction Studies Anti Sensory Left/Right Comparison   Stim Site L Lat (ms) R Lat (ms) L-R Lat (ms) L Amp (V) R Amp (V) L-R Amp (%) Site1 Site2 L Vel (m/s) R Vel (m/s) L-R Vel (m/s)  Median Acr Palm Anti  Sensory (2nd Digit)  31.7C  Wrist  *4.6   *6.3  Wrist Palm     Palm  *2.9   6.3        Radial Anti Sensory (Base 1st Digit)  31.7C  Wrist  2.0   25.9  Wrist Base 1st Digit     Ulnar Anti Sensory (5th Digit)  31.8C  Wrist  2.8   21.7  Wrist 5th Digit  50    Motor Left/Right Comparison   Stim Site L Lat (ms) R Lat (ms) L-R Lat (ms) L Amp (mV) R Amp (mV) L-R Amp (%) Site1 Site2 L Vel (m/s) R Vel (m/s) L-R Vel (m/s)  Median Motor (Abd Poll Brev)  31.7C    Martin-Gruber  Wrist  *6.7   *1.5  Elbow Wrist  300   Elbow  7.4   3.5        Ulnar Motor (Abd Dig Min)  31.7C  Wrist  2.7   6.9  B Elbow Wrist  64   B Elbow  5.9   6.6  A Elbow B Elbow  91   A Elbow  7.0   6.6           Waveforms:

## 2020-09-08 NOTE — Progress Notes (Signed)
Danielle RiedelBonnie J Goyal - 44 y.o. female MRN [id number]  Date of birth: 11/26/1975  Office Visit Note: Visit Date: 09/04/2020 PCP: Etta GrandchildJones, Thomas L, MD Referred by: Etta GrandchildJones, Thomas L, MD  Subjective: Chief Complaint  Patient presents with  . Right Hand - Numbness   HPI: Garrel RidgelBonnie J Bermea is a 44 y.o. female who comes in today at the request of Karenann CaiLuke Magnant, PA-C for electrodiagnostic study of the Right upper extremities.  Patient is Right hand dominant.  She had ACL reconstruction back in August by Dr. August Saucerean.  During the period after that and follow-up she noted right hand pain and numbness with tingling particularly in the first and second digits of her long digit.  Over time this is progressed despite wearing splints and medication.  She gets more symptoms in the long finger and ring finger at this point.  Is worse at night worse with hand position.  She may have had a carpal tunnel injection that did seem to help to some degree.  No left-sided complaints.  She has no radicular symptoms.  No prior electrodiagnostic studies.  ROS Otherwise per HPI.  Assessment & Plan: Visit Diagnoses:  1. Paresthesia of skin     Plan: Impression: The above electrodiagnostic study is ABNORMAL and reveals evidence of a severe right median nerve entrapment at the wrist (carpal tunnel syndrome) affecting sensory and motor components. The lesion is characterized by sensory and motor demyelination with evidence of injury.   There is no significant electrodiagnostic evidence of any other focal nerve entrapment, brachial plexopathy or cervical radiculopathy.   Recommendations: 1.  Follow-up with referring physician. 2.  Continue current management of symptoms. 3.  Suggest surgical evaluation.  Meds & Orders: No orders of the defined types were placed in this encounter.   Orders Placed This Encounter  Procedures  . NCV with EMG (electromyography)    Follow-up: Return for AmerisourceBergen CorporationLuke Magnant, PA-C.   Procedures: No  procedures performed  EMG & NCV Findings: Evaluation of the right median motor nerve showed prolonged distal onset latency (6.7 ms) and reduced amplitude (1.5 mV).  The right median (across palm) sensory nerve showed prolonged distal peak latency (Wrist, 4.6 ms), reduced amplitude (6.3 V), and prolonged distal peak latency (Palm, 2.9 ms).  All remaining nerves (as indicated in the following tables) were within normal limits.    Needle evaluation of the right abductor pollicis brevis muscle showed increased insertional activity.  All remaining muscles (as indicated in the following table) showed no evidence of electrical instability.    Impression: The above electrodiagnostic study is ABNORMAL and reveals evidence of a severe right median nerve entrapment at the wrist (carpal tunnel syndrome) affecting sensory and motor components. The lesion is characterized by sensory and motor demyelination with evidence of injury.   There is no significant electrodiagnostic evidence of any other focal nerve entrapment, brachial plexopathy or cervical radiculopathy.   Recommendations: 1.  Follow-up with referring physician. 2.  Continue current management of symptoms. 3.  Suggest surgical evaluation.  ___________________________ Naaman PlummerFred Kaiesha Tonner FAAPMR Board Certified, American Board of Physical Medicine and Rehabilitation    Nerve Conduction Studies Anti Sensory Summary Table   Stim Site NR Peak (ms) Norm Peak (ms) P-T Amp (V) Norm P-T Amp Site1 Site2 Delta-P (ms) Dist (cm) Vel (m/s) Norm Vel (m/s)  Right Median Acr Palm Anti Sensory (2nd Digit)  31.7C  Wrist    *4.6 <3.6 *6.3 >10 Wrist Palm 1.7 0.0    Palm    *  2.9 <2.0 6.3         Right Radial Anti Sensory (Base 1st Digit)  31.7C  Wrist    2.0 <3.1 25.9  Wrist Base 1st Digit 2.0 0.0    Right Ulnar Anti Sensory (5th Digit)  31.8C  Wrist    2.8 <3.7 21.7 >15.0 Wrist 5th Digit 2.8 14.0 50 >38   Motor Summary Table   Stim Site NR Onset (ms) Norm  Onset (ms) O-P Amp (mV) Norm O-P Amp Site1 Site2 Delta-0 (ms) Dist (cm) Vel (m/s) Norm Vel (m/s)  Right Median Motor (Abd Poll Brev)  31.7C    Martin-Gruber  Wrist    *6.7 <4.2 *1.5 >5 Elbow Wrist 0.7 21.0 300 >50  Elbow    7.4  3.5         Right Ulnar Motor (Abd Dig Min)  31.7C  Wrist    2.7 <4.2 6.9 >3 B Elbow Wrist 3.2 20.5 64 >53  B Elbow    5.9  6.6  A Elbow B Elbow 1.1 10.0 91 >53  A Elbow    7.0  6.6          EMG   Side Muscle Nerve Root Ins Act Fibs Psw Amp Dur Poly Recrt Int Dennie Bible Comment  Right Abd Poll Brev Median C8-T1 *Incr Nml Nml Nml Nml 0 Nml Nml   Right 1stDorInt Ulnar C8-T1 Nml Nml Nml Nml Nml 0 Nml Nml   Right PronatorTeres Median C6-7 Nml Nml Nml Nml Nml 0 Nml Nml   Right Biceps Musculocut C5-6 Nml Nml Nml Nml Nml 0 Nml Nml   Right Deltoid Axillary C5-6 Nml Nml Nml Nml Nml 0 Nml Nml     Nerve Conduction Studies Anti Sensory Left/Right Comparison   Stim Site L Lat (ms) R Lat (ms) L-R Lat (ms) L Amp (V) R Amp (V) L-R Amp (%) Site1 Site2 L Vel (m/s) R Vel (m/s) L-R Vel (m/s)  Median Acr Palm Anti Sensory (2nd Digit)  31.7C  Wrist  *4.6   *6.3  Wrist Palm     Palm  *2.9   6.3        Radial Anti Sensory (Base 1st Digit)  31.7C  Wrist  2.0   25.9  Wrist Base 1st Digit     Ulnar Anti Sensory (5th Digit)  31.8C  Wrist  2.8   21.7  Wrist 5th Digit  50    Motor Left/Right Comparison   Stim Site L Lat (ms) R Lat (ms) L-R Lat (ms) L Amp (mV) R Amp (mV) L-R Amp (%) Site1 Site2 L Vel (m/s) R Vel (m/s) L-R Vel (m/s)  Median Motor (Abd Poll Brev)  31.7C    Martin-Gruber  Wrist  *6.7   *1.5  Elbow Wrist  300   Elbow  7.4   3.5        Ulnar Motor (Abd Dig Min)  31.7C  Wrist  2.7   6.9  B Elbow Wrist  64   B Elbow  5.9   6.6  A Elbow B Elbow  91   A Elbow  7.0   6.6           Waveforms:             Clinical History: No specialty comments available.   She reports that she has been smoking e-cigarettes. She has a 3.75 pack-year smoking history. She has  never used smokeless tobacco. No results for input(s): HGBA1C, LABURIC in the last 8760 hours.  Objective:  VS:  HT:    WT:   BMI:     BP:   HR: bpm  TEMP: ( )  RESP:  Physical Exam Musculoskeletal:        General: No swelling, tenderness or deformity.     Comments: Inspection reveals no atrophy of the bilateral APB or FDI or hand intrinsics. There is no swelling, color changes, allodynia or dystrophic changes. There is 5 out of 5 strength in the bilateral wrist extension, finger abduction and long finger flexion. There is intact sensation to light touch in all dermatomal and peripheral nerve distributions.  There is a positiveTinel's test at the right wrist. There is a negative Hoffmann's test bilaterally.  Skin:    General: Skin is warm and dry.     Findings: No erythema or rash.  Neurological:     General: No focal deficit present.     Mental Status: She is alert and oriented to person, place, and time.     Motor: No weakness or abnormal muscle tone.     Coordination: Coordination normal.  Psychiatric:        Mood and Affect: Mood normal.        Behavior: Behavior normal.     Ortho Exam  Imaging: No results found.  Past Medical/Family/Surgical/Social History: Medications & Allergies reviewed per EMR, new medications updated. Patient Active Problem List   Diagnosis Date Noted  . Hypokalemia 08/24/2020  . Need for hepatitis C screening test 08/24/2020  . Primary hypertension 08/24/2020  . High urine creatine 08/24/2020  . Right carpal tunnel syndrome 12/06/2019  . Xerosis cutis 10/16/2019  . Nicotine vapor product user 08/09/2018  . Visit for screening mammogram 12/13/2016  . Family history of premature CAD 12/13/2016  . Radiculitis of left cervical region 03/23/2016  . 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 Routine general medical examination at a health care facility 03/22/2013  . Allergic rhinitis, cause unspecified 03/22/2013  . Depression with anxiety 10/31/2011    Past Medical History:  Diagnosis Date  . Abnormal Pap smear   . Allergy   . Complication of anesthesia   . History of chicken pox   . 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 Hyperlipidemia   . Migraine   . PONV (postoperative nausea and vomiting)   . PPD positive, treated 2004   Family History  Problem Relation Age of Onset  . Stroke Mother   . Dementia Mother   . Hyperlipidemia Mother   . Early death Father   . Heart disease Father   . Hyperlipidemia Father   . Cancer Neg Hx   . Depression Neg Hx   . Diabetes Neg Hx   . Drug abuse Neg Hx   . Hearing loss Neg Hx   . Hypertension Neg Hx    Past Surgical History:  Procedure Laterality Date  . ANTERIOR CRUCIATE LIGAMENT REPAIR Left 06/11/2020   Procedure: LEFT KNEE ANTERIOR CRUCIATE LIGAMENT (ACL) RECONSTRUCTION WITH WITH QUAD ALLOGRAFT, MENISCAL REPAIR vs DEBRIDEMENT;  Surgeon: Cammy Copa, MD;  Location: [facility] OR;  Service: Orthopedics;  Laterality: Left;  . CHOLECYSTECTOMY, LAPAROSCOPIC  07/13/2000  . COLPOSCOPY  2000,   . TONSILLECTOMY    . WISDOM TOOTH EXTRACTION  1995 Jan   Social History   Occupational History  . Not on file  Tobacco Use  . Smoking status: Current Every Day Smoker    Packs/day: 0.25    Years: 15.00    Pack years: 3.75  Types: E-cigarettes  . Smokeless tobacco: Never Used  Substance and Sexual Activity  . Alcohol use: No    Comment: few glasses of wine during pregnancy  . Drug use: No  . Sexual activity: Yes    Partners: Male

## 2020-09-14 ENCOUNTER — Ambulatory Visit: Payer: 59 | Admitting: Physical Therapy

## 2020-09-14 ENCOUNTER — Encounter: Payer: Self-pay | Admitting: Physical Therapy

## 2020-09-14 ENCOUNTER — Other Ambulatory Visit: Payer: Self-pay

## 2020-09-14 DIAGNOSIS — R600 Localized edema: Secondary | ICD-10-CM | POA: Diagnosis not present

## 2020-09-14 DIAGNOSIS — R2689 Other abnormalities of gait and mobility: Secondary | ICD-10-CM | POA: Diagnosis not present

## 2020-09-14 DIAGNOSIS — M25562 Pain in left knee: Secondary | ICD-10-CM

## 2020-09-14 DIAGNOSIS — M6281 Muscle weakness (generalized): Secondary | ICD-10-CM | POA: Diagnosis not present

## 2020-09-14 DIAGNOSIS — G8929 Other chronic pain: Secondary | ICD-10-CM

## 2020-09-14 DIAGNOSIS — M25662 Stiffness of left knee, not elsewhere classified: Secondary | ICD-10-CM

## 2020-09-14 NOTE — Therapy (Signed)
[facility] [address] [place], [place], [address] Phone: [phone]   Fax:  [phone]  Physical Therapy Treatment  Patient Details  Name: Danielle Cook MRN: [id number] Date of Birth: [birth year] Referring Provider (PT): Danielle Cook Danielle Mckusick, MD   Encounter Date: 09/14/2020   PT End of Session - 09/14/20 1103    Visit Number 20    Number of Visits 23    Date for PT Re-Evaluation 09/28/20    PT Start Time 1020    PT Stop Time 1100    PT Time Calculation (min) 40 min    Activity Tolerance Patient tolerated treatment well    Behavior During Therapy [facility] for tasks assessed/performed           Past Medical History:  Diagnosis Date  . Abnormal Pap smear   . Allergy   . Complication of anesthesia   . History of chicken pox   . 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 Hyperlipidemia   . Migraine   . PONV (postoperative nausea and vomiting)   . PPD positive, treated 2004    Past Surgical History:  Procedure Laterality Date  . ANTERIOR CRUCIATE LIGAMENT REPAIR Left 06/11/2020   Procedure: LEFT KNEE ANTERIOR CRUCIATE LIGAMENT (ACL) RECONSTRUCTION WITH WITH QUAD ALLOGRAFT, MENISCAL REPAIR vs DEBRIDEMENT;  Surgeon: Danielle Copa, MD;  Location: [facility] OR;  Service: Orthopedics;  Laterality: Left;  . CHOLECYSTECTOMY, LAPAROSCOPIC  07/13/2000  . COLPOSCOPY  2000,   . TONSILLECTOMY    . WISDOM TOOTH EXTRACTION  1995 Jan    There were no vitals filed for this visit.   Subjective Assessment - 09/14/20 1042    Subjective I feel I have turned a corner. Less pain today    Limitations Standing;Lifting;Walking    How long can you sit comfortably? unlimited    Diagnostic tests 02/29/2020 IMPRESSION:1. Complete tear of the anterior cruciate ligament.2. Small longitudinal tear of the undersurface of the periphery ofthe posterior horn of the medial meniscus.3. Slight sprains of the femoral attachments of the medial andlateral collateral ligaments.4. Prominent  joint effusion.5. 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    Patient Stated Goals get back to work, return to regular activities    Pain Onset 1 to 4 weeks ago           [facility] Adult PT Treatment/Exercise - 09/14/20 0001      Knee/Hip Exercises: Stretches   Passive Hamstring Stretch Left;30 seconds   3 reps standing, 3 reps at leg press   Gastroc Stretch Both;3 reps;30 seconds      Knee/Hip Exercises: Aerobic   Recumbent Bike L3 x 6 min      Knee/Hip Exercises: Machines for Strengthening   Total Gym Leg Press LLE only 81# 3x10 insterspaced with hamstring stretch      Knee/Hip Exercises: Standing   Forward Step Up Left;15 reps;Step Height: 6";Hand Hold: 0    Other Standing Knee Exercises TRX series: squats X 15 reps, lunges X 10 bilat, lateral lunges X 10 bilat, SL RDL X 10 bilat                    PT Short Term Goals - 07/08/20 1651      PT SHORT TERM GOAL #1   Title improve Lt knee AROM 0-100 for improved function    Time 4    Period Weeks    Status Achieved    Target Date 07/20/20      PT SHORT  TERM GOAL #2   Title amb without AD independently for improved function    Status Achieved    Target Date 07/20/20      PT SHORT TERM GOAL #3   Title report pain < 2/10 with amb for improved mobility    Status Achieved    Target Date 07/20/20             PT Long Term Goals - 08/17/20 1552      PT LONG TERM GOAL #1   Title demonstrate improved single leg reach test to >/= 95% strength for improved function    Target Date 09/28/20      PT LONG TERM GOAL #2   Title improve LLE balance and strength by standing on compliance surface SLS > 30 sec    Status New    Target Date 09/28/20      PT LONG TERM GOAL #3   Title descend stairs without significant deviations for improved strength and function    Status New    Target Date 09/28/20      PT LONG TERM GOAL #4   Title pt to be I with all HEP given to maintain and progress current level of  function    Status On-going    Target Date 09/28/20                 Plan - 09/14/20 1104    Clinical Impression Statement She was not having much pain today so able to progress back strengthening program. She relays she wants to purchase TRX system so reviewed what exercises to do with this and took pictures of her with her phone so she will have record of these to guide herself at home. Continue POC    Personal Factors and Comorbidities Age;Comorbidity 2    Comorbidities migraines, depression    Examination-Activity Limitations Locomotion Level;Transfers;Squat;Stairs;Stand;Lift;Hygiene/Grooming;Dressing;Carry;Caring for Others;Bathing    Examination-Participation Restrictions Meal Prep;Occupation;Cleaning;Driving;P[facility] Badder [facility]    Stability/Clinical Decision Making Stable/Uncomplicated    Rehab Potential Good    PT Frequency 1x / week   2-3x/wk   PT Duration 6 weeks    PT Treatment/Interventions ADLs/Self Care Home Management;Cryotherapy;Electrical Stimulation;Iontophoresis 4mg /ml Dexamethasone;Moist Heat;Ultrasound;Therapeutic activities;Therapeutic exercise;Balance training;Neuromuscular re-education;Patient/family education;Manual techniques;Dry needling;Taping;Vasopneumatic Device;Passive range of motion;Stair training;Gait training    PT Next Visit Plan continue strengthening    PT Home Exercise Plan [id number] - SLR 4 way, quad set, heel slides    Consulted and Agree with Plan of Care Patient           Patient will benefit from skilled therapeutic intervention in order to improve the following deficits and impairments:  Abnormal gait, Improper body mechanics, Increased muscle spasms, Decreased strength, Pain, Decreased activity tolerance, Decreased balance, Decreased endurance, Difficulty walking, Decreased range of motion, Decreased mobility, Increased edema  Visit Diagnosis: Chronic pain of left knee  Muscle weakness (generalized)  Localized edema  Other  abnormalities of gait and mobility  Stiffness of left knee, not elsewhere classified     Problem List Patient Active Problem List   Diagnosis Date Noted  . Hypokalemia 08/24/2020  . Need for hepatitis C screening test 08/24/2020  . Primary hypertension 08/24/2020  . High urine creatine 08/24/2020  . Right carpal tunnel syndrome 12/06/2019  . Xerosis cutis 10/16/2019  . Nicotine vapor product user 08/09/2018  . Visit for screening mammogram 12/13/2016  . Family history of premature CAD 12/13/2016  . Radiculitis of left cervical region 03/23/2016  . 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 Routine general  medical examination at a health care facility 03/22/2013  . Allergic rhinitis, cause unspecified 03/22/2013  . Depression with anxiety 10/31/2011    Birdie Riddle 09/14/2020, 11:10 AM  [facility] [address] [place], [place], [address] Phone: [phone]   Fax:  [phone]  Name: Danielle Cook MRN: [id number] Date of Birth: 05-07-1976

## 2020-09-21 ENCOUNTER — Ambulatory Visit: Payer: 59 | Admitting: Orthopedic Surgery

## 2020-09-21 ENCOUNTER — Ambulatory Visit: Payer: 59 | Admitting: Physical Therapy

## 2020-09-21 ENCOUNTER — Other Ambulatory Visit: Payer: Self-pay

## 2020-09-21 ENCOUNTER — Encounter: Payer: Self-pay | Admitting: Physical Therapy

## 2020-09-21 DIAGNOSIS — M6281 Muscle weakness (generalized): Secondary | ICD-10-CM | POA: Diagnosis not present

## 2020-09-21 DIAGNOSIS — M25562 Pain in left knee: Secondary | ICD-10-CM | POA: Diagnosis not present

## 2020-09-21 DIAGNOSIS — R600 Localized edema: Secondary | ICD-10-CM | POA: Diagnosis not present

## 2020-09-21 DIAGNOSIS — G5601 Carpal tunnel syndrome, right upper limb: Secondary | ICD-10-CM

## 2020-09-21 DIAGNOSIS — G8929 Other chronic pain: Secondary | ICD-10-CM

## 2020-09-21 DIAGNOSIS — R2689 Other abnormalities of gait and mobility: Secondary | ICD-10-CM

## 2020-09-21 DIAGNOSIS — M25662 Stiffness of left knee, not elsewhere classified: Secondary | ICD-10-CM

## 2020-09-21 NOTE — Therapy (Signed)
[facility] [address] [place], [place], [address] Phone: [phone]   Fax:  [phone]  Physical Therapy Treatment/Discharge Summary  Patient Details  Name: Danielle Cook MRN: [id number] Date of Birth: 1976-07-02 Referring Provider (PT): Marlou Sa Tonna Corner, MD   Encounter Date: 09/21/2020   PT End of Session - 09/21/20 1243    Visit Number 21    PT Start Time 9449    PT Stop Time 1220    PT Time Calculation (min) 24 min    Activity Tolerance Patient tolerated treatment well    Behavior During Therapy [facility] for tasks assessed/performed           Past Medical History:  Diagnosis Date  . Abnormal Pap smear   . Allergy   . Complication of anesthesia   . History of chicken pox   . 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 Hyperlipidemia   . Migraine   . PONV (postoperative nausea and vomiting)   . PPD positive, treated 2004    Past Surgical History:  Procedure Laterality Date  . ANTERIOR CRUCIATE LIGAMENT REPAIR Left 06/11/2020   Procedure: LEFT KNEE ANTERIOR CRUCIATE LIGAMENT (ACL) RECONSTRUCTION WITH WITH QUAD ALLOGRAFT, MENISCAL REPAIR vs DEBRIDEMENT;  Surgeon: Meredith Pel, MD;  Location: [facility];  Service: Orthopedics;  Laterality: Left;  . CHOLECYSTECTOMY, LAPAROSCOPIC  07/13/2000  . COLPOSCOPY  2000,   . TONSILLECTOMY    . [place] Jan    There were no vitals filed for this visit.   Subjective Assessment - 09/21/20 1201    Subjective knee is doing much better overall - sore after PT session.  Work is going well, no issues with knee    Limitations Standing;Lifting;Walking    How long can you sit comfortably? unlimited    Diagnostic tests 02/29/2020 IMPRESSION:1. Complete tear of the anterior cruciate ligament.2. Small longitudinal tear of the undersurface of the periphery ofthe posterior horn of the medial meniscus.3. Slight sprains of the femoral attachments of the medial andlateral collateral ligaments.4.  Prominent joint effusion.5. 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    Patient Stated Goals get back to work, return to regular activities    Currently in Pain? No/denies              [facility] PT Assessment - 09/21/20 1207      Functional Tests   Functional tests Single leg stance      Single Leg Stance   Comments LLE 45 sec on compliant surface      Strength   Overall Strength Comments Functional strength assessment using single leg reach. Rt leg 16.5" inch, Lt leg 15.67" = 95%      Ambulation/Gait   Stairs Yes    Stairs Assistance 7: Independent    Stair Management Technique No rails;Alternating pattern                         [facility] Adult PT Treatment/Exercise - 09/21/20 1202      Knee/Hip Exercises: Aerobic   Recumbent Bike L6 x 6 min                    PT Short Term Goals - 07/08/20 1651      PT SHORT TERM GOAL #1   Title improve Lt knee AROM 0-100 for improved function    Time 4    Period Weeks    Status Achieved    Target Date  07/20/20      PT SHORT TERM GOAL #2   Title amb without AD independently for improved function    Status Achieved    Target Date 07/20/20      PT SHORT TERM GOAL #3   Title report pain < 2/10 with amb for improved mobility    Status Achieved    Target Date 07/20/20             PT Long Term Goals - 09/21/20 1244      PT LONG TERM GOAL #1   Title demonstrate improved single leg reach test to >/= 95% strength for improved function    Status Achieved      PT LONG TERM GOAL #2   Title improve LLE balance and strength by standing on compliance surface SLS > 30 sec    Status Achieved      PT LONG TERM GOAL #3   Title descend stairs without significant deviations for improved strength and function    Status Achieved      PT LONG TERM GOAL #4   Title pt to be I with all HEP given to maintain and progress current level of function    Status Achieved                 Plan -  09/21/20 1244    Clinical Impression Statement Pt has met all goals and is ready for d/c today from PT.  Encouraged continued exercise and LLe strengthening at home.    Personal Factors and Comorbidities Age;Comorbidity 2    Comorbidities migraines, depression    Examination-Activity Limitations Locomotion Level;Transfers;Squat;Stairs;Stand;Lift;Hygiene/Grooming;Dressing;Carry;Caring for Others;Bathing    Examination-Participation Restrictions Meal Prep;Occupation;Cleaning;Driving;[facility]    Stability/Clinical Decision Making Stable/Uncomplicated    Rehab Potential Good    PT Frequency 1x / week   2-3x/wk   PT Duration 6 weeks    PT Treatment/Interventions ADLs/Self Care Home Management;Cryotherapy;Electrical Stimulation;Iontophoresis 23m/ml Dexamethasone;Moist Heat;Ultrasound;Therapeutic activities;Therapeutic exercise;Balance training;Neuromuscular re-education;Patient/family education;Manual techniques;Dry needling;Taping;Vasopneumatic Device;Passive range of motion;Stair training;Gait training    PT Next Visit Plan d/c PT    PT Home Exercise Plan [id number] - SLR 4 way, quad set, heel slides    Consulted and Agree with Plan of Care Patient           Patient will benefit from skilled therapeutic intervention in order to improve the following deficits and impairments:  Abnormal gait, Improper body mechanics, Increased muscle spasms, Decreased strength, Pain, Decreased activity tolerance, Decreased balance, Decreased endurance, Difficulty walking, Decreased range of motion, Decreased mobility, Increased edema  Visit Diagnosis: Chronic pain of left knee  Muscle weakness (generalized)  Localized edema  Other abnormalities of gait and mobility  Stiffness of left knee, not elsewhere classified     Problem List Patient Active Problem List   Diagnosis Date Noted  . Hypokalemia 08/24/2020  . Need for hepatitis C screening test 08/24/2020  . Primary hypertension 08/24/2020    . High urine creatine 08/24/2020  . Right carpal tunnel syndrome 12/06/2019  . Xerosis cutis 10/16/2019  . Nicotine vapor product user 08/09/2018  . Visit for screening mammogram 12/13/2016  . Family history of premature CAD 12/13/2016  . Radiculitis of left cervical region 03/23/2016  . 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 Routine general medical examination at a health care facility 03/22/2013  . Allergic rhinitis, cause unspecified 03/22/2013  . Depression with anxiety 10/31/2011     SLaureen Abrahams PT, DPT 09/21/20 12:45 PM    [facility] [address] [place], [place], [address] Phone: [phone]   Fax:  [phone]  Name: Danielle Cook MRN: [id number] Date of Birth: 01/26/1976      PHYSICAL THERAPY DISCHARGE SUMMARY  Visits from Start of Care: 21  Current functional level related to goals / functional outcomes: See above   Remaining deficits: See above   Education / Equipment: HEP  Plan: Patient agrees to discharge.  Patient goals were met. Patient is being discharged due to meeting the stated rehab goals.  ?????    Laureen Abrahams, PT, DPT 09/21/20 12:46 PM  [facility] [address] [place], [place], [address] Phone: [phone]   Fax:  [phone]

## 2020-09-24 ENCOUNTER — Other Ambulatory Visit: Payer: Self-pay | Admitting: Surgical

## 2020-09-26 ENCOUNTER — Encounter: Payer: Self-pay | Admitting: Orthopedic Surgery

## 2020-09-26 ENCOUNTER — Other Ambulatory Visit: Payer: Self-pay | Admitting: Surgical

## 2020-09-26 MED ORDER — ORPHENADRINE CITRATE ER 100 MG PO TB12: 100.0000 mg | ORAL_TABLET | Freq: Two times a day (BID) | ORAL | 0 refills | Status: DC | PRN

## 2020-09-26 NOTE — Progress Notes (Signed)
Office Visit Note   Patient: Danielle Cook           Date of Birth: [birth year]           MRN: [id number] Visit Date: 09/21/2020 Requested by: Etta Grandchild, MD [address] [place],  [place] [address] PCP: Etta Grandchild, MD  Subjective: Chief Complaint  Patient presents with  . EMG/NCV REVIEW    HPI: Danielle Cook is a 44 y.o. female who presents to the office complaining of right wrist pain and numbness/tingling.  Patient has had symptoms for several years but significantly worse since February 2021.  She notes numbness and tingling in her second, third, fourth fingers on the palmar aspect.  She has been using wrist splint with only very mild relief of her symptoms.  She had nerve conduction study by Dr. Naaman Plummer that revealed severe right-sided carpal tunnel syndrome with median nerve entrapment at the wrist.  She has had injections into the carpal tunnel before but none recently.  Additionally, patient returns following left knee ACL reconstruction on 06/11/2020.  She states that her left knee feels great.  She has finished physical therapy and she has begun ascending and descending stairs sequentially without taking one stair with 2 feet at a time.  She states that it does not quite feel like normal knee yet but she continues to progress without any regression.              ROS: All systems reviewed are negative as they relate to the chief complaint within the history of present illness.  Patient denies fevers or chills.  Assessment & Plan: Visit Diagnoses:  1. Right carpal tunnel syndrome     Plan: Patient is a 44 year old female who presents following left knee ACL reconstruction on 06/11/2020.  Left knee is doing great and the graft is stable on Lachman exam.  No significant effusion on exam today.  Range of motion is excellent and her gait looks great as well.  The main reason that she returns today is to discuss her nerve conduction study which showed severe  right-sided carpal tunnel syndrome.  She has had symptoms for several years but the symptoms have been significantly worse since this February.  Discussed options available to patient.  Based on the findings as well as her symptoms, recommended carpal tunnel release rather than trying another injection.  Discussed the risk and benefits of the procedure.  Patient understands that she may have some transient numbness and tingling or stinging pain that remains even after release of the carpal tunnel due to the duration of her symptoms.  Plan to post patient for right carpal tunnel release on December 2 so that she has to miss as little work as possible.  Patient agreed with plan.  Follow-up after procedure.  Follow-Up Instructions: No follow-ups on file.   Orders:  No orders of the defined types were placed in this encounter.  No orders of the defined types were placed in this encounter.     Procedures: No procedures performed   Clinical Data: No additional findings.  Objective: Vital Signs: There were no vitals taken for this visit.  Physical Exam:  Constitutional: Patient appears well-developed HEENT:  Head: Normocephalic Eyes:EOM are normal Neck: Normal range of motion Cardiovascular: Normal rate Pulmonary/chest: Effort normal Neurologic: Patient is alert Skin: Skin is warm Psychiatric: Patient has normal mood and affect  Ortho Exam: Ortho exam demonstrates right hand with decreased sensation on the palmar  aspect of the second, third, fourth fingers.  Positive Tinel's sign with "electrical" sensation radiating down the same fingers.  Positive Phalen sign.  No significant atrophy throughout the right hand.  Negative Froment's sign.  No subluxing ulnar nerve.  No tenderness over the ulnar nerve or Tinel's sign at the elbow.  Negative elbow flexion test.  Left knee with no effusion.  Excellent gait with no antalgia.  Left knee ACL graft is stable on Lachman exam.  0 degrees of  extension, greater than 120 degrees of flexion.  No calf tenderness.  Negative Homans' sign.  Specialty Comments:  No specialty comments available.  Imaging: No results found.   PMFS History: Patient Active Problem List   Diagnosis Date Noted  . Hypokalemia 08/24/2020  . Need for hepatitis C screening test 08/24/2020  . Primary hypertension 08/24/2020  . High urine creatine 08/24/2020  . Right carpal tunnel syndrome 12/06/2019  . Xerosis cutis 10/16/2019  . Nicotine vapor product user 08/09/2018  . Visit for screening mammogram 12/13/2016  . Family history of premature CAD 12/13/2016  . Radiculitis of left cervical region 03/23/2016  . 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 Routine general medical examination at a health care facility 03/22/2013  . Allergic rhinitis, cause unspecified 03/22/2013  . Depression with anxiety 10/31/2011   Past Medical History:  Diagnosis Date  . Abnormal Pap smear   . Allergy   . Complication of anesthesia   . History of chicken pox   . 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 Hyperlipidemia   . Migraine   . PONV (postoperative nausea and vomiting)   . PPD positive, treated 2004    Family History  Problem Relation Age of Onset  . Stroke Mother   . Dementia Mother   . Hyperlipidemia Mother   . Early death Father   . Heart disease Father   . Hyperlipidemia Father   . Cancer Neg Hx   . Depression Neg Hx   . Diabetes Neg Hx   . Drug abuse Neg Hx   . Hearing loss Neg Hx   . Hypertension Neg Hx     Past Surgical History:  Procedure Laterality Date  . ANTERIOR CRUCIATE LIGAMENT REPAIR Left 06/11/2020   Procedure: LEFT KNEE ANTERIOR CRUCIATE LIGAMENT (ACL) RECONSTRUCTION WITH WITH QUAD ALLOGRAFT, MENISCAL REPAIR vs DEBRIDEMENT;  Surgeon: Cammy Copa, MD;  Location: [facility] OR;  Service: Orthopedics;  Laterality: Left;  . CHOLECYSTECTOMY, LAPAROSCOPIC  07/13/2000  . COLPOSCOPY  2000,   . TONSILLECTOMY    . WISDOM TOOTH EXTRACTION  1995 Jan   Social  History   Occupational History  . Not on file  Tobacco Use  . Smoking status: Current Every Day Smoker    Packs/day: 0.25    Years: 15.00    Pack years: 3.75    Types: E-cigarettes  . Smokeless tobacco: Never Used  Substance and Sexual Activity  . Alcohol use: No    Comment: few glasses of wine during pregnancy  . Drug use: No  . Sexual activity: Yes    Partners: Male

## 2020-09-28 ENCOUNTER — Other Ambulatory Visit (HOSPITAL_COMMUNITY)
Admission: RE | Admit: 2020-09-28 | Discharge: 2020-09-28 | Disposition: A | Discharge status: Home / Self Care | Payer: 59 | Source: Ambulatory Visit | Attending: Orthopedic Surgery | Admitting: Orthopedic Surgery

## 2020-09-28 DIAGNOSIS — Z01812 Encounter for preprocedural laboratory examination: Secondary | ICD-10-CM | POA: Insufficient documentation

## 2020-09-28 DIAGNOSIS — Z20822 Contact with and (suspected) exposure to covid-19: Secondary | ICD-10-CM | POA: Insufficient documentation

## 2020-09-28 LAB — SARS CORONAVIRUS 2 (TAT 6-24 HRS): SARS Coronavirus 2: NEGATIVE

## 2020-09-28 NOTE — Telephone Encounter (Signed)
Sent in refill over the weekend

## 2020-09-28 NOTE — Telephone Encounter (Signed)
Please advise. Thanks.  

## 2020-09-30 ENCOUNTER — Encounter (HOSPITAL_COMMUNITY): Payer: Self-pay | Admitting: Orthopedic Surgery

## 2020-09-30 NOTE — Progress Notes (Signed)
Patient denies shortness of breath, fever, cough or chest pain.  PCP - Dr Sanda Linger Cardiologist - n/a  Chest x-ray - n/a EKG - n/a Stress Test - 12/22/16 ECHO - n/a Cardiac Cath - n/a  ERAS: Clears til 1:25 pm on DOS.  STOP now taking any Aspirin (unless otherwise instructed by your surgeon), Aleve, Naproxen, Ibuprofen, Motrin, Advil, Goody's, BC's, all herbal medications, fish oil, and all vitamins.   Coronavirus Screening Covid test on 09/28/20 was negative.  Patient verbalized understanding of instructions that were given via phone.

## 2020-10-01 ENCOUNTER — Ambulatory Visit (HOSPITAL_COMMUNITY): Payer: BC Managed Care – PPO | Admitting: Certified Registered"

## 2020-10-01 ENCOUNTER — Ambulatory Visit (HOSPITAL_COMMUNITY)
Admission: RE | Admit: 2020-10-01 | Discharge: 2020-10-01 | Disposition: A | Discharge status: Home / Self Care | Payer: BC Managed Care – PPO | Attending: Orthopedic Surgery | Admitting: Orthopedic Surgery

## 2020-10-01 ENCOUNTER — Other Ambulatory Visit: Payer: Self-pay

## 2020-10-01 ENCOUNTER — Encounter (HOSPITAL_COMMUNITY): Payer: Self-pay | Admitting: Orthopedic Surgery

## 2020-10-01 ENCOUNTER — Encounter (HOSPITAL_COMMUNITY)
Admission: RE | Disposition: A | Discharge status: Home / Self Care | Payer: Self-pay | Source: Home / Self Care | Attending: Orthopedic Surgery

## 2020-10-01 DIAGNOSIS — G5601 Carpal tunnel syndrome, right upper limb: Secondary | ICD-10-CM | POA: Diagnosis present

## 2020-10-01 DIAGNOSIS — Z91040 Latex allergy status: Secondary | ICD-10-CM | POA: Diagnosis not present

## 2020-10-01 DIAGNOSIS — F17290 Nicotine dependence, other tobacco product, uncomplicated: Secondary | ICD-10-CM | POA: Insufficient documentation

## 2020-10-01 DIAGNOSIS — Z888 Allergy status to other drugs, medicaments and biological substances status: Secondary | ICD-10-CM | POA: Insufficient documentation

## 2020-10-01 HISTORY — PX: CARPAL TUNNEL RELEASE: SHX101

## 2020-10-01 HISTORY — DX: Depression, unspecified: F32.A

## 2020-10-01 HISTORY — DX: Anxiety disorder, unspecified: F41.9

## 2020-10-01 LAB — BASIC METABOLIC PANEL
Anion gap: 11 (ref 5–15)
BUN: 8 mg/dL (ref 6–20)
CO2: 24 mmol/L (ref 22–32)
Calcium: 9.2 mg/dL (ref 8.9–10.3)
Chloride: 102 mmol/L (ref 98–111)
Creatinine, Ser: 0.54 mg/dL (ref 0.44–1.00)
GFR, Estimated: >60 mL/min (ref >60)
Glucose, Bld: 92 mg/dL (ref 70–99)
Potassium: 3.6 mmol/L (ref 3.5–5.1)
Sodium: 137 mmol/L (ref 135–145)

## 2020-10-01 LAB — CBC
HCT: 44.2 % (ref 36.0–46.0)
Hemoglobin: 14.9 g/dL (ref 12.0–15.0)
MCH: 30 pg (ref 26.0–34.0)
MCHC: 33.7 g/dL (ref 30.0–36.0)
MCV: 89.1 fL (ref 80.0–100.0)
Platelets: 270 10*3/uL (ref 150–400)
RBC: 4.96 MIL/uL (ref 3.87–5.11)
RDW: 13 % (ref 11.5–15.5)
WBC: 6.6 10*3/uL (ref 4.0–10.5)
nRBC: 0 % (ref 0.0–0.2)

## 2020-10-01 LAB — POCT PREGNANCY, URINE: Preg Test, Ur: NEGATIVE

## 2020-10-01 SURGERY — CARPAL TUNNEL RELEASE
Anesthesia: Monitor Anesthesia Care | Laterality: Right

## 2020-10-01 MED ORDER — POVIDONE-IODINE 7.5 % EX SOLN: Freq: Once | CUTANEOUS | Status: DC

## 2020-10-01 MED ORDER — PROPOFOL 500 MG/50ML IV EMUL
INTRAVENOUS | Status: DC | PRN
  Administered 2020-10-01: 50 ug/kg/min via INTRAVENOUS

## 2020-10-01 MED ORDER — BUPIVACAINE HCL (PF) 0.25 % IJ SOLN
INTRAMUSCULAR | Status: DC | PRN
  Administered 2020-10-01: 18 mL

## 2020-10-01 MED ORDER — FENTANYL CITRATE (PF) 250 MCG/5ML IJ SOLN
INTRAMUSCULAR | Status: AC
  Filled 2020-10-01: qty 5

## 2020-10-01 MED ORDER — MIDAZOLAM HCL 5 MG/5ML IJ SOLN
INTRAMUSCULAR | Status: DC | PRN
  Administered 2020-10-01: 2 mg via INTRAVENOUS

## 2020-10-01 MED ORDER — CEFAZOLIN SODIUM-DEXTROSE 2-4 GM/100ML-% IV SOLN
2.0000 g | INTRAVENOUS | Status: AC
  Administered 2020-10-01: 2 g via INTRAVENOUS
  Filled 2020-10-01: qty 100

## 2020-10-01 MED ORDER — FENTANYL CITRATE (PF) 250 MCG/5ML IJ SOLN
INTRAMUSCULAR | Status: DC | PRN
  Administered 2020-10-01: 100 ug via INTRAVENOUS
  Administered 2020-10-01: 50 ug via INTRAVENOUS

## 2020-10-01 MED ORDER — TRAMADOL HCL 50 MG PO TABS
50.0000 mg | ORAL_TABLET | Freq: Four times a day (QID) | ORAL | 0 refills | Status: DC | PRN
Start: 2020-10-01 — End: 2020-10-08

## 2020-10-01 MED ORDER — ONDANSETRON HCL 4 MG/2ML IJ SOLN
INTRAMUSCULAR | Status: DC | PRN
  Administered 2020-10-01: 4 mg via INTRAVENOUS

## 2020-10-01 MED ORDER — CHLORHEXIDINE GLUCONATE 0.12 % MT SOLN
15.0000 mL | OROMUCOSAL | Status: AC
Start: 2020-10-01 — End: 2020-10-01

## 2020-10-01 MED ORDER — BUPIVACAINE HCL (PF) 0.25 % IJ SOLN
INTRAMUSCULAR | Status: AC
  Filled 2020-10-01: qty 30

## 2020-10-01 MED ORDER — CHLORHEXIDINE GLUCONATE 0.12 % MT SOLN
OROMUCOSAL | Status: AC
  Administered 2020-10-01: 15 mL via OROMUCOSAL
  Filled 2020-10-01: qty 15

## 2020-10-01 MED ORDER — POVIDONE-IODINE 10 % EX SWAB: 2.0000 "application " | Freq: Once | CUTANEOUS | Status: DC

## 2020-10-01 MED ORDER — LACTATED RINGERS IV SOLN
INTRAVENOUS | Status: DC
Start: 2020-10-01 — End: 2020-10-01

## 2020-10-01 MED ORDER — 0.9 % SODIUM CHLORIDE (POUR BTL) OPTIME
TOPICAL | Status: DC | PRN
  Administered 2020-10-01: 1000 mL

## 2020-10-01 MED ORDER — FENTANYL CITRATE (PF) 100 MCG/2ML IJ SOLN: 25.0000 ug | INTRAMUSCULAR | Status: DC | PRN

## 2020-10-01 MED ORDER — MIDAZOLAM HCL 2 MG/2ML IJ SOLN
INTRAMUSCULAR | Status: AC
  Filled 2020-10-01: qty 2

## 2020-10-01 SURGICAL SUPPLY — 49 items
BLADE SURG 15 STRL LF DISP TIS (BLADE) ×1 IMPLANT
BLADE SURG 15 STRL SS (BLADE) ×1
BNDG ELASTIC 3X5.8 VLCR STR LF (GAUZE/BANDAGES/DRESSINGS) ×4 IMPLANT
BNDG ELASTIC 4X5.8 VLCR STR LF (GAUZE/BANDAGES/DRESSINGS) ×2 IMPLANT
BNDG ESMARK 4X9 LF (GAUZE/BANDAGES/DRESSINGS) IMPLANT
BNDG GAUZE ELAST 4 BULKY (GAUZE/BANDAGES/DRESSINGS) ×2 IMPLANT
CORD BIPOLAR FORCEPS 12FT (ELECTRODE) ×2 IMPLANT
COVER SURGICAL LIGHT HANDLE (MISCELLANEOUS) ×2 IMPLANT
COVER WAND RF STERILE (DRAPES) ×2 IMPLANT
CUFF TOURN SGL QUICK 18X4 (TOURNIQUET CUFF) ×2 IMPLANT
CUFF TOURN SGL QUICK 24 (TOURNIQUET CUFF)
CUFF TRNQT CYL 24X4X16.5-23 (TOURNIQUET CUFF) IMPLANT
DRAPE SURG 17X23 STRL (DRAPES) ×2 IMPLANT
DURAPREP 26ML APPLICATOR (WOUND CARE) ×2 IMPLANT
GAUZE SPONGE 4X4 12PLY STRL (GAUZE/BANDAGES/DRESSINGS) ×2 IMPLANT
GAUZE XEROFORM 1X8 LF (GAUZE/BANDAGES/DRESSINGS) ×2 IMPLANT
GLOVE BIOGEL PI IND STRL 8 (GLOVE) ×1 IMPLANT
GLOVE BIOGEL PI INDICATOR 8 (GLOVE) ×1
GLOVE ECLIPSE 8.0 STRL XLNG CF (GLOVE) ×2 IMPLANT
GOWN STRL REUS W/ TWL LRG LVL3 (GOWN DISPOSABLE) ×2 IMPLANT
GOWN STRL REUS W/ TWL XL LVL3 (GOWN DISPOSABLE) ×1 IMPLANT
GOWN STRL REUS W/TWL LRG LVL3 (GOWN DISPOSABLE) ×2
GOWN STRL REUS W/TWL XL LVL3 (GOWN DISPOSABLE) ×1
KIT BASIN OR (CUSTOM PROCEDURE TRAY) ×2 IMPLANT
KIT TURNOVER KIT B (KITS) ×2 IMPLANT
LOOP VESSEL MAXI BLUE (MISCELLANEOUS) IMPLANT
NEEDLE HYPO 25GX1X1/2 BEV (NEEDLE) IMPLANT
NS IRRIG 1000ML POUR BTL (IV SOLUTION) ×2 IMPLANT
PACK ORTHO EXTREMITY (CUSTOM PROCEDURE TRAY) ×2 IMPLANT
PAD ABD 8X10 STRL (GAUZE/BANDAGES/DRESSINGS) ×2 IMPLANT
PAD ARMBOARD 7.5X6 YLW CONV (MISCELLANEOUS) ×4 IMPLANT
PAD CAST 4YDX4 CTTN HI CHSV (CAST SUPPLIES) ×2 IMPLANT
PADDING CAST ABS 3INX4YD NS (CAST SUPPLIES) ×1
PADDING CAST ABS COTTON 3X4 (CAST SUPPLIES) ×1 IMPLANT
PADDING CAST COTTON 4X4 STRL (CAST SUPPLIES) ×2
SLING ARM FOAM STRAP LRG (SOFTGOODS) ×2 IMPLANT
SUCTION FRAZIER HANDLE 10FR (MISCELLANEOUS)
SUCTION TUBE FRAZIER 10FR DISP (MISCELLANEOUS) IMPLANT
SUT ETHILON 3 0 PS 1 (SUTURE) ×2 IMPLANT
SUT VIC AB 2-0 CT1 27 (SUTURE)
SUT VIC AB 2-0 CT1 TAPERPNT 27 (SUTURE) IMPLANT
SUT VIC AB 3-0 FS2 27 (SUTURE) IMPLANT
SYR CONTROL 10ML LL (SYRINGE) IMPLANT
SYSTEM CHEST DRAIN TLS 7FR (DRAIN) IMPLANT
TOWEL GREEN STERILE (TOWEL DISPOSABLE) ×2 IMPLANT
TOWEL GREEN STERILE FF (TOWEL DISPOSABLE) ×2 IMPLANT
TUBE CONNECTING 12X1/4 (SUCTIONS) IMPLANT
UNDERPAD 30X36 HEAVY ABSORB (UNDERPADS AND DIAPERS) ×2 IMPLANT
WATER STERILE IRR 1000ML POUR (IV SOLUTION) ×2 IMPLANT

## 2020-10-01 NOTE — H&P (Signed)
Danielle Cook is an 44 y.o. female.   Chief Complaint: Right wrist pain HPI: Danielle Cook is a 44 year old patient with right wrist pain and carpal tunnel syndrome of long duration.  EMG nerve study shows severe carpal tunnel compression at the right wrist.  Patient has had multiple injections in the wrist before and she has tried wrist splints.  She reports worsening symptoms and pain which interfere with her ADLs and sleep.  Presents now for operative management after explanation risk benefits.  Past Medical History:  Diagnosis Date  . Abnormal Pap smear   . Allergy   . Anxiety   . Complication of anesthesia   . Depression   . History of chicken pox   . 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 Hyperlipidemia    diet controlled - no meds  . Migraine   . PONV (postoperative nausea and vomiting)   . PPD positive, treated 2004   currently no symptoms, chest xrays negative per pt 09/30/20    Past Surgical History:  Procedure Laterality Date  . ANTERIOR CRUCIATE LIGAMENT REPAIR Left 06/11/2020   Procedure: LEFT KNEE ANTERIOR CRUCIATE LIGAMENT (ACL) RECONSTRUCTION WITH WITH QUAD ALLOGRAFT, MENISCAL REPAIR vs DEBRIDEMENT;  Surgeon: Cammy Copa, MD;  Location: [facility] OR;  Service: Orthopedics;  Laterality: Left;  . CHOLECYSTECTOMY, LAPAROSCOPIC  07/13/2000  . COLPOSCOPY  2000,   . EYE SURGERY Bilateral 07/2017   Lasik  . TONSILLECTOMY    . WISDOM TOOTH EXTRACTION  1995 Jan    Family History  Problem Relation Age of Onset  . Stroke Mother   . Dementia Mother   . Hyperlipidemia Mother   . Early death Father   . Heart disease Father   . Hyperlipidemia Father   . Cancer Neg Hx   . Depression Neg Hx   . Diabetes Neg Hx   . Drug abuse Neg Hx   . Hearing loss Neg Hx   . Hypertension Neg Hx    Social History:  reports that she has been smoking e-cigarettes. She has a 3.75 pack-year smoking history. She has never used smokeless tobacco. She reports previous alcohol use. She reports that she does not  use drugs.  Allergies:  Allergies  Allergen Reactions  . Eggs Or Egg-Derived Products     Sensitivity   . Guaifenesin Er Anxiety and Palpitations  . Latex Hives and Rash  . Wellbutrin [Bupropion Hcl] Anxiety and Palpitations    No medications prior to admission.    No results found for this or any previous visit (from the past 48 hour(s)). No results found.  Review of Systems  Musculoskeletal: Positive for arthralgias.  All other systems reviewed and are negative.   Height 5\' 4"  (1.626 m), weight 104.3 kg. Physical Exam Vitals reviewed.  HENT:     Head: Normocephalic.     Nose: Nose normal.     Mouth/Throat:     Mouth: Mucous membranes are moist.  Eyes:     Pupils: Pupils are equal, round, and reactive to light.  Cardiovascular:     Rate and Rhythm: Normal rate.     Pulses: Normal pulses.  Pulmonary:     Effort: Pulmonary effort is normal.  Abdominal:     General: Abdomen is flat.  Musculoskeletal:     Cervical back: Normal range of motion.  Skin:    General: Skin is warm.     Capillary Refill: Capillary refill takes less than 2 seconds.  Neurological:     General:  No focal deficit present.     Mental Status: She is alert.  Psychiatric:        Mood and Affect: Mood normal.   Right wrist demonstrates full active and passive range of motion.  Positive carpal tunnel compression testing.  Abductor pollicis brevis strength intact.  EPL FPL function intact.  Radial pulse intact.  Does describe paresthesias digits 2 and 3 palmar surface.  Assessment/Plan Impression is right carpal tunnel syndrome which is severe by nerve conduction study.  Patient has had injections in the past.  Has tried conservative measures.  Is having daily symptoms along with pain that interferes with functional activities with the right hand.  Plan for carpal tunnel release.  Risk benefits discussed include not limited to infection nerve vessel damage diminished grip strength for a period of 6 to  8 weeks.  Patient understands risk benefits and wishes to proceed.  All questions answered  Burnard Bunting, MD 10/01/2020, 11:52 AM

## 2020-10-01 NOTE — Transfer of Care (Signed)
Immediate Anesthesia Transfer of Care Note  Patient: Danielle Cook  Procedure(s) Performed: RIGHT CARPAL TUNNEL RELEASE (Right )  Patient Location: PACU  Anesthesia Type:MAC and Bier block  Level of Consciousness: awake, alert  and oriented  Airway & Oxygen Therapy: Patient Spontanous Breathing  Post-op Assessment: Report given to RN and Post -op Vital signs reviewed and stable  Post vital signs: Reviewed and stable  Last Vitals:  Vitals Value Taken Time  BP 173/77 10/01/20 1651  Temp    Pulse 73 10/01/20 1654  Resp 13 10/01/20 1654  SpO2 99 % 10/01/20 1654  Vitals shown include unvalidated device data.  Last Pain:  Vitals:   10/01/20 1436  TempSrc: Oral         Complications: No complications documented.

## 2020-10-01 NOTE — Anesthesia Preprocedure Evaluation (Signed)
Anesthesia Evaluation  Patient identified by MRN, date of birth, ID band Patient awake    Reviewed: Allergy & Precautions, NPO status , Patient's Chart, lab work & pertinent test results  History of Anesthesia Complications (+) PONV  Airway Mallampati: II  TM Distance: >3 FB     Dental   Pulmonary Current Smoker and Patient abstained from smoking.,    breath sounds clear to auscultation       Cardiovascular hypertension,  Rhythm:Regular Rate:Normal     Neuro/Psych  Headaches, PSYCHIATRIC DISORDERS Anxiety  Neuromuscular disease    GI/Hepatic negative GI ROS, Neg liver ROS,   Endo/Other  negative endocrine ROS  Renal/GU negative Renal ROS     Musculoskeletal   Abdominal   Peds  Hematology   Anesthesia Other Findings   Reproductive/Obstetrics                             Anesthesia Physical Anesthesia Plan  ASA: III  Anesthesia Plan: MAC and Bier Block and Bier Block-LIDOCAINE ONLY   Post-op Pain Management:    Induction: Intravenous  PONV Risk Score and Plan: 2 and Ondansetron and Midazolam  Airway Management Planned: Simple Face Mask and Nasal Cannula  Additional Equipment:   Intra-op Plan:   Post-operative Plan:   Informed Consent: I have reviewed the patients History and Physical, chart, labs and discussed the procedure including the risks, benefits and alternatives for the proposed anesthesia with the patient or authorized representative who has indicated his/her understanding and acceptance.     Dental advisory given  Plan Discussed with: CRNA and Anesthesiologist  Anesthesia Plan Comments:         Anesthesia Quick Evaluation

## 2020-10-01 NOTE — Brief Op Note (Signed)
   10/01/2020  4:55 PM  PATIENT:  Danielle Cook  44 y.o. female  PRE-OPERATIVE DIAGNOSIS:  right carpal tunnel syndrome  POST-OPERATIVE DIAGNOSIS:  right carpal tunnel syndrome  PROCEDURE:  Procedure(s): RIGHT CARPAL TUNNEL RELEASE  SURGEON:  Surgeon(s): Cammy Copa, MD  ASSISTANT: magnant pa  ANESTHESIA:   regional  EBL: 1 ml    Total I/O In: 400 [I.V.:400] Out: 3 [Blood:3]  BLOOD ADMINISTERED: none  DRAINS: none   LOCAL MEDICATIONS USED:  Plain marcaine  SPECIMEN:  No Specimen  COUNTS:  YES  TOURNIQUET:   Total Tourniquet Time Documented: Upper Arm (Right) - 29 minutes Total: Upper Arm (Right) - 29 minutes   DICTATION: .Other Dictation: Dictation Number [id number]  PLAN OF CARE: Discharge to home after PACU  PATIENT DISPOSITION:  PACU - hemodynamically stable

## 2020-10-01 NOTE — Anesthesia Procedure Notes (Signed)
Anesthesia Regional Block: Bier block (IV Regional)   Pre-Anesthetic Checklist: ,, timeout performed, Correct Patient, Correct Site, Correct Laterality, Correct Procedure, Correct Position, site marked, Risks and benefits discussed,  Surgical consent,  Pre-op evaluation,  At surgeon's request and post-op pain management  Laterality: Right  Prep: chloraprep       Needles:  Injection technique: Single-shot      Additional Needles:   Procedures:,,,,,, Esmarch exsanguination, single tourniquet utilized,  Narrative:   Performed by: With CRNAs

## 2020-10-02 ENCOUNTER — Encounter (HOSPITAL_COMMUNITY): Payer: Self-pay | Admitting: Orthopedic Surgery

## 2020-10-02 NOTE — Op Note (Signed)
NAME: Danielle Cook, HAWKS MEDICAL RECORD [id number] ACCOUNT [id number] DATE OF BIRTH:07-01-1976 FACILITY: [facility] LOCATION: [facility] PHYSICIAN:Benjerman Molinelli Diamantina Providence, MD  OPERATIVE REPORT  DATE OF PROCEDURE:  10/01/2020  PREOPERATIVE DIAGNOSIS:  Right carpal tunnel syndrome.  POSTOPERATIVE DIAGNOSIS:  Right carpal tunnel syndrome.  PROCEDURE:  Right carpal tunnel release.  SURGEON:  Cammy Copa, MD  ASSISTANT:  Karenann Cai, PA.  FINDINGS:  A 44 year old patient with severe carpal tunnel syndrome, refractory to nonoperative management, who presents for operative management after explanation of risks and benefits.  PROCEDURE IN DETAIL:  The patient was brought to the operating room where IV regional anesthetic was induced.  Preoperative antibiotics administered.  Timeout was called.  Right hand was prescrubbed with alcohol and Betadine, allowed to air dry, prepped  with DuraPrep solution and draped in sterile manner.  Timeout was called.  The IV regional anesthetic had been induced, the incision was then made at the radial border of the fourth finger intersecting with Kaplan's cardinal line taken to the distal  wrist flexion crease.  Skin and subcutaneous tissue were sharply divided.  Palmar fascia was divided.  The palmaris brevis muscle belly was divided.  Self-retaining retractor placed.  Incision was then made in the transverse carpal ligament along its  midportion 2 to 3 mm.  Right angle retractor was then placed between the nerve and the transverse carpal ligament.  Transverse carpal ligament was then divided under direct visualization distally along its length.  Also, divided proximally with use of a  _____ retractor under direct visualization to the forearm fascia.  No masses or cysts within the carpal canal.  Nerve branch intact.  The nerve was most compressed along its distal aspect within the carpal canal.  The tourniquet was released after  numbing the skin edges with plain  Marcaine.  Thorough irrigation was performed.  Incision was then closed using 3-0 nylon.  Xeroform, 4 x 4's, ABD and a volar splint was applied.  The patient tolerated the procedure well without immediate complication  and transferred to the recovery room in stable condition.  Luke's assistance was required at all times for retraction, opening and closing.  His assistance was a medical necessity.  HN/NUANCE  D:10/01/2020 T:10/02/2020 JOB:[id number]

## 2020-10-04 ENCOUNTER — Encounter (HOSPITAL_COMMUNITY): Payer: Self-pay | Admitting: Orthopedic Surgery

## 2020-10-04 NOTE — Anesthesia Postprocedure Evaluation (Signed)
Anesthesia Post Note  Patient: Danielle Cook  Procedure(s) Performed: RIGHT CARPAL TUNNEL RELEASE (Right )     Patient location during evaluation: PACU Anesthesia Type: MAC and Bier Block Level of consciousness: awake and alert Pain management: pain level controlled Vital Signs Assessment: post-procedure vital signs reviewed and stable Respiratory status: spontaneous breathing, nonlabored ventilation, respiratory function stable and patient connected to nasal cannula oxygen Cardiovascular status: stable and blood pressure returned to baseline Postop Assessment: no apparent nausea or vomiting Anesthetic complications: no   No complications documented.  Last Vitals:  Vitals:   10/01/20 1651 10/01/20 1706  BP: (!) 173/77 (!) 168/91  Pulse: 77 66  Resp: 13 12  Temp: 36.7 C (!) 36.3 C  SpO2: 100% 100%    Last Pain:  Vitals:   10/01/20 1436  TempSrc: Oral                 HODIERNE,ADAM S

## 2020-10-08 ENCOUNTER — Other Ambulatory Visit: Payer: Self-pay | Admitting: Surgical

## 2020-10-08 MED ORDER — TRAMADOL HCL 50 MG PO TABS
50.0000 mg | ORAL_TABLET | Freq: Four times a day (QID) | ORAL | 0 refills | Status: DC | PRN
Start: 2020-10-08 — End: 2021-01-06

## 2020-10-12 ENCOUNTER — Ambulatory Visit (INDEPENDENT_AMBULATORY_CARE_PROVIDER_SITE_OTHER): Payer: 59 | Admitting: Orthopedic Surgery

## 2020-10-12 ENCOUNTER — Other Ambulatory Visit: Payer: Self-pay

## 2020-10-12 ENCOUNTER — Encounter: Payer: Self-pay | Admitting: Orthopedic Surgery

## 2020-10-12 DIAGNOSIS — G5601 Carpal tunnel syndrome, right upper limb: Secondary | ICD-10-CM

## 2020-10-12 NOTE — Progress Notes (Signed)
Post-Op Visit Note   Patient: Danielle Cook           Date of Birth: 1976-08-31           MRN: [id number] Visit Date: 10/12/2020 PCP: Etta Grandchild, MD   Assessment & Plan:  Chief Complaint:  Chief Complaint  Patient presents with   Other    Post op from CTR on 10/01/20   Visit Diagnoses:  1. Right carpal tunnel syndrome     Plan: Patient is a 44 year old female presents s/p right wrist carpal tunnel release on 10/01/2020.  She states that she is doing well following surgery.  Her numbness has improved significantly since the procedure but she does note a small amount of numbness at the tips of her thumb, index, middle, ring fingers of the right hand.  She is satisfied with the progress she is made from the procedure thus far.  She is taking gabapentin and tramadol to control her pain.  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  She is avoiding lifting more than a couple pounds at a time.  Incision is healing well and half of the sutures were removed but there was some mild gapping of the incision so plan to leave half of the sutures and patient will remove them herself in 6 days time and then apply Steri-Strips with benzoin.  Plan to follow-up in 4 weeks for clinical recheck.  Hold off on heavy lifting until then and plan to release her completely at that appointment provided she is doing well.  Follow-Up Instructions: No follow-ups on file.   Orders:  No orders of the defined types were placed in this encounter.  No orders of the defined types were placed in this encounter.   Imaging: No results found.  PMFS History: Patient Active Problem List   Diagnosis Date Noted   Hypokalemia 08/24/2020   Need for hepatitis C screening test 08/24/2020   Primary hypertension 08/24/2020   High urine creatine 08/24/2020   Right carpal tunnel syndrome 12/06/2019   Xerosis cutis 10/16/2019   Nicotine vapor product user 08/09/2018   Visit for screening mammogram  12/13/2016   Family history of premature CAD 12/13/2016   Radiculitis of left cervical region 03/23/2016   PPD positive, treated 03/22/2013   Routine general medical examination at a health care facility 03/22/2013   Allergic rhinitis, cause unspecified 03/22/2013   Depression with anxiety 10/31/2011   Past Medical History:  Diagnosis Date   Abnormal Pap smear    Allergy    Anxiety    Complication of anesthesia    Depression    History of chicken pox    Hx of TB skin testing    Hyperlipidemia    diet controlled - no meds   Migraine    PONV (postoperative nausea and vomiting)    PPD positive, treated 2004   currently no symptoms, chest xrays negative per pt 09/30/20    Family History  Problem Relation Age of Onset   Stroke Mother    Dementia Mother    Hyperlipidemia Mother    Early death Father    Heart disease Father    Hyperlipidemia Father    Cancer Neg Hx    Depression Neg Hx    Diabetes Neg Hx    Drug abuse Neg Hx    Hearing loss Neg Hx    Hypertension Neg Hx     Past Surgical History:  Procedure Laterality Date  ANTERIOR CRUCIATE LIGAMENT REPAIR Left 06/11/2020   Procedure: LEFT KNEE ANTERIOR CRUCIATE LIGAMENT (ACL) RECONSTRUCTION WITH WITH QUAD ALLOGRAFT, MENISCAL REPAIR vs DEBRIDEMENT;  Surgeon: Cammy Copa, MD;  Location: [facility] OR;  Service: Orthopedics;  Laterality: Left;   CARPAL TUNNEL RELEASE Right 10/01/2020   Procedure: RIGHT CARPAL TUNNEL RELEASE;  Surgeon: Cammy Copa, MD;  Location: [facility] OR;  Service: Orthopedics;  Laterality: Right;   CHOLECYSTECTOMY, LAPAROSCOPIC  07/13/2000   COLPOSCOPY  2000,    EYE SURGERY Bilateral 07/2017   Lasik   TONSILLECTOMY     WISDOM TOOTH EXTRACTION  1995 Jan   Social History   Occupational History   Not on file  Tobacco Use   Smoking status: Current Every Day Smoker    Packs/day: 0.25    Years: 15.00    Pack years: 3.75    Types: E-cigarettes   Smokeless  tobacco: Never Used  Building services engineer Use: Never used  Substance and Sexual Activity   Alcohol use: Not Currently    Comment: occasional   Drug use: No   Sexual activity: Yes    Partners: Male    Birth control/protection: I.U.D.    Comment: Mirena IUD

## 2020-10-14 ENCOUNTER — Other Ambulatory Visit: Payer: Self-pay | Admitting: Internal Medicine

## 2020-10-16 ENCOUNTER — Ambulatory Visit (INDEPENDENT_AMBULATORY_CARE_PROVIDER_SITE_OTHER): Payer: 59 | Admitting: Orthopedic Surgery

## 2020-10-16 ENCOUNTER — Other Ambulatory Visit: Payer: Self-pay

## 2020-10-16 DIAGNOSIS — G5601 Carpal tunnel syndrome, right upper limb: Secondary | ICD-10-CM

## 2020-10-18 ENCOUNTER — Encounter: Payer: Self-pay | Admitting: Orthopedic Surgery

## 2020-10-18 NOTE — Progress Notes (Signed)
Post-Op Visit Note   Patient: Danielle Cook           Date of Birth: Mar 01, 1976           MRN: [id number] Visit Date: 10/16/2020 PCP: Etta Grandchild, MD   Assessment & Plan:  Chief Complaint:  Chief Complaint  Patient presents with  . Other     Post op follow up to check incision-R CTR 10/01/20   Visit Diagnoses: No diagnosis found.  Plan: Verl is a 44 year old patient who is now about 2 weeks out right carpal tunnel release.  Remainder of the sutures removed today.  A little bit of gapping at the proximal aspect of the incision but no overt infection.  Dry dressing applied and would like to see her next week just for clinical recheck on the incision.  We discussed things to avoid in terms of lifting and stretching of the incision.  Follow-Up Instructions: No follow-ups on file.   Orders:  No orders of the defined types were placed in this encounter.  No orders of the defined types were placed in this encounter.   Imaging: No results found.  PMFS History: Patient Active Problem List   Diagnosis Date Noted  . Hypokalemia 08/24/2020  . Need for hepatitis C screening test 08/24/2020  . Primary hypertension 08/24/2020  . High urine creatine 08/24/2020  . Right carpal tunnel syndrome 12/06/2019  . Xerosis cutis 10/16/2019  . Nicotine vapor product user 08/09/2018  . Visit for screening mammogram 12/13/2016  . Family history of premature CAD 12/13/2016  . Radiculitis of left cervical region 03/23/2016  . 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 Routine general medical examination at a health care facility 03/22/2013  . Allergic rhinitis, cause unspecified 03/22/2013  . Depression with anxiety 10/31/2011   Past Medical History:  Diagnosis Date  . Abnormal Pap smear   . Allergy   . Anxiety   . Complication of anesthesia   . Depression   . History of chicken pox   . 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 Hyperlipidemia    diet controlled - no meds  . Migraine   . PONV  (postoperative nausea and vomiting)   . PPD positive, treated 2004   currently no symptoms, chest xrays negative per pt 09/30/20    Family History  Problem Relation Age of Onset  . Stroke Mother   . Dementia Mother   . Hyperlipidemia Mother   . Early death Father   . Heart disease Father   . Hyperlipidemia Father   . Cancer Neg Hx   . Depression Neg Hx   . Diabetes Neg Hx   . Drug abuse Neg Hx   . Hearing loss Neg Hx   . Hypertension Neg Hx     Past Surgical History:  Procedure Laterality Date  . ANTERIOR CRUCIATE LIGAMENT REPAIR Left 06/11/2020   Procedure: LEFT KNEE ANTERIOR CRUCIATE LIGAMENT (ACL) RECONSTRUCTION WITH WITH QUAD ALLOGRAFT, MENISCAL REPAIR vs DEBRIDEMENT;  Surgeon: Cammy Copa, MD;  Location: [facility] OR;  Service: Orthopedics;  Laterality: Left;  . CARPAL TUNNEL RELEASE Right 10/01/2020   Procedure: RIGHT CARPAL TUNNEL RELEASE;  Surgeon: Cammy Copa, MD;  Location: [facility] OR;  Service: Orthopedics;  Laterality: Right;  . CHOLECYSTECTOMY, LAPAROSCOPIC  07/13/2000  . COLPOSCOPY  2000,   . EYE SURGERY Bilateral 07/2017   Lasik  . TONSILLECTOMY    . WISDOM TOOTH EXTRACTION  1995 Jan   Social History   Occupational History  .  Not on file  Tobacco Use  . Smoking status: Current Every Day Smoker    Packs/day: 0.25    Years: 15.00    Pack years: 3.75    Types: E-cigarettes  . Smokeless tobacco: Never Used  Vaping Use  . Vaping Use: Never used  Substance and Sexual Activity  . Alcohol use: Not Currently    Comment: occasional  . Drug use: No  . Sexual activity: Yes    Partners: Male    Birth control/protection: I.U.D.    Comment: Mirena IUD

## 2020-10-22 ENCOUNTER — Other Ambulatory Visit: Payer: Self-pay

## 2020-10-22 ENCOUNTER — Ambulatory Visit (INDEPENDENT_AMBULATORY_CARE_PROVIDER_SITE_OTHER): Payer: 59 | Admitting: Orthopedic Surgery

## 2020-10-22 DIAGNOSIS — G5601 Carpal tunnel syndrome, right upper limb: Secondary | ICD-10-CM

## 2020-10-26 ENCOUNTER — Encounter: Payer: Self-pay | Admitting: Orthopedic Surgery

## 2020-10-26 NOTE — Progress Notes (Signed)
Post-Op Visit Note   Patient: Danielle Cook           Date of Birth: [birth year]           MRN: [id number] Visit Date: 10/22/2020 PCP: Etta Grandchild, MD   Assessment & Plan:  Chief Complaint:  Chief Complaint  Patient presents with  . Other    Recheck incision from CTR   Visit Diagnoses: No diagnosis found.  Plan: Patient is a 44 year old female who presents following right carpal tunnel release.  She is doing well overall and her incision has fully healed with no evidence of infection or dehiscence at this time.  She does endorse continued numbness and tingling at the tips of her thumb and index fingers but she does state that this seems to be improving slowly.  She had returned to work but unfortunately she has been [place] by her job.  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  Plan for patient to follow-up in 6 weeks for final check.  Follow-Up Instructions: No follow-ups on file.   Orders:  No orders of the defined types were placed in this encounter.  No orders of the defined types were placed in this encounter.   Imaging: No results found.  PMFS History: Patient Active Problem List   Diagnosis Date Noted  . Hypokalemia 08/24/2020  . Need for hepatitis C screening test 08/24/2020  . Primary hypertension 08/24/2020  . High urine creatine 08/24/2020  . Right carpal tunnel syndrome 12/06/2019  . Xerosis cutis 10/16/2019  . Nicotine vapor product user 08/09/2018  . Visit for screening mammogram 12/13/2016  . Family history of premature CAD 12/13/2016  . Radiculitis of left cervical region 03/23/2016  . 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 Routine general medical examination at a health care facility 03/22/2013  . Allergic rhinitis, cause unspecified 03/22/2013  . Depression with anxiety 10/31/2011   Past Medical History:  Diagnosis Date  . Abnormal Pap smear   . Allergy   . Anxiety   . Complication of anesthesia   . Depression    . History of chicken pox   . 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 Hyperlipidemia    diet controlled - no meds  . Migraine   . PONV (postoperative nausea and vomiting)   . PPD positive, treated 2004   currently no symptoms, chest xrays negative per pt 09/30/20    Family History  Problem Relation Age of Onset  . Stroke Mother   . Dementia Mother   . Hyperlipidemia Mother   . Early death Father   . Heart disease Father   . Hyperlipidemia Father   . Cancer Neg Hx   . Depression Neg Hx   . Diabetes Neg Hx   . Drug abuse Neg Hx   . Hearing loss Neg Hx   . Hypertension Neg Hx     Past Surgical History:  Procedure Laterality Date  . ANTERIOR CRUCIATE LIGAMENT REPAIR Left 06/11/2020   Procedure: LEFT KNEE ANTERIOR CRUCIATE LIGAMENT (ACL) RECONSTRUCTION WITH WITH QUAD ALLOGRAFT, MENISCAL REPAIR vs DEBRIDEMENT;  Surgeon: Cammy Copa, MD;  Location: [facility] OR;  Service: Orthopedics;  Laterality: Left;  . CARPAL TUNNEL RELEASE Right 10/01/2020   Procedure: RIGHT CARPAL TUNNEL RELEASE;  Surgeon: Cammy Copa, MD;  Location: [facility] OR;  Service: Orthopedics;  Laterality: Right;  . CHOLECYSTECTOMY, LAPAROSCOPIC  07/13/2000  . COLPOSCOPY  2000,   . EYE SURGERY Bilateral 07/2017  Lasik  . TONSILLECTOMY    . WISDOM TOOTH EXTRACTION  1995 Jan   Social History   Occupational History  . Not on file  Tobacco Use  . Smoking status: Current Every Day Smoker    Packs/day: 0.25    Years: 15.00    Pack years: 3.75    Types: E-cigarettes  . Smokeless tobacco: Never Used  Vaping Use  . Vaping Use: Never used  Substance and Sexual Activity  . Alcohol use: Not Currently    Comment: occasional  . Drug use: No  . Sexual activity: Yes    Partners: Male    Birth control/protection: I.U.D.    Comment: Mirena IUD

## 2020-11-09 ENCOUNTER — Ambulatory Visit (INDEPENDENT_AMBULATORY_CARE_PROVIDER_SITE_OTHER): Payer: 59 | Admitting: Orthopedic Surgery

## 2020-11-09 DIAGNOSIS — G5601 Carpal tunnel syndrome, right upper limb: Secondary | ICD-10-CM

## 2020-11-18 ENCOUNTER — Encounter: Payer: Self-pay | Admitting: Orthopedic Surgery

## 2020-11-18 NOTE — Progress Notes (Signed)
Post-Op Visit Note   Patient: Danielle Cook           Date of Birth: Apr 10, 1976           MRN: [id number] Visit Date: 11/09/2020 PCP: Etta Grandchild, MD   Assessment & Plan:  Chief Complaint:  Chief Complaint  Patient presents with  . Other     S/p right CTR   Visit Diagnoses:  1. Right carpal tunnel syndrome     Plan: Danielle Cook is a 45 year old patient who underwent carpal tunnel release about 6 weeks ago.  She is doing well.  On exam the incision is well-healed.  Knee is also doing well from her ACL reconstruction.  She is able to work efficiently.  Plan at this time is to release by knee.  Continue with nonweightbearing quad strengthening activities.  Follow-up as needed.  Overall she is happy with her surgical results.  Follow-Up Instructions: Return if symptoms worsen or fail to improve.   Orders:  No orders of the defined types were placed in this encounter.  No orders of the defined types were placed in this encounter.   Imaging: No results found.  PMFS History: Patient Active Problem List   Diagnosis Date Noted  . Hypokalemia 08/24/2020  . Need for hepatitis C screening test 08/24/2020  . Primary hypertension 08/24/2020  . High urine creatine 08/24/2020  . Right carpal tunnel syndrome 12/06/2019  . Xerosis cutis 10/16/2019  . Nicotine vapor product user 08/09/2018  . Visit for screening mammogram 12/13/2016  . Family history of premature CAD 12/13/2016  . Radiculitis of left cervical region 03/23/2016  . 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 Routine general medical examination at a health care facility 03/22/2013  . Allergic rhinitis, cause unspecified 03/22/2013  . Depression with anxiety 10/31/2011   Past Medical History:  Diagnosis Date  . Abnormal Pap smear   . Allergy   . Anxiety   . Complication of anesthesia   . Depression   . History of chicken pox   . 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 Hyperlipidemia    diet controlled - no meds  . Migraine    . PONV (postoperative nausea and vomiting)   . PPD positive, treated 2004   currently no symptoms, chest xrays negative per pt 09/30/20    Family History  Problem Relation Age of Onset  . Stroke Mother   . Dementia Mother   . Hyperlipidemia Mother   . Early death Father   . Heart disease Father   . Hyperlipidemia Father   . Cancer Neg Hx   . Depression Neg Hx   . Diabetes Neg Hx   . Drug abuse Neg Hx   . Hearing loss Neg Hx   . Hypertension Neg Hx     Past Surgical History:  Procedure Laterality Date  . ANTERIOR CRUCIATE LIGAMENT REPAIR Left 06/11/2020   Procedure: LEFT KNEE ANTERIOR CRUCIATE LIGAMENT (ACL) RECONSTRUCTION WITH WITH QUAD ALLOGRAFT, MENISCAL REPAIR vs DEBRIDEMENT;  Surgeon: Cammy Copa, MD;  Location: [facility] OR;  Service: Orthopedics;  Laterality: Left;  . CARPAL TUNNEL RELEASE Right 10/01/2020   Procedure: RIGHT CARPAL TUNNEL RELEASE;  Surgeon: Cammy Copa, MD;  Location: [facility] OR;  Service: Orthopedics;  Laterality: Right;  . CHOLECYSTECTOMY, LAPAROSCOPIC  07/13/2000  . COLPOSCOPY  2000,   . EYE SURGERY Bilateral 07/2017   Lasik  . TONSILLECTOMY    . WISDOM TOOTH EXTRACTION  1995 Jan   Social History  Occupational History  . Not on file  Tobacco Use  . Smoking status: Current Every Day Smoker    Packs/day: 0.25    Years: 15.00    Pack years: 3.75    Types: E-cigarettes  . Smokeless tobacco: Never Used  Vaping Use  . Vaping Use: Never used  Substance and Sexual Activity  . Alcohol use: Not Currently    Comment: occasional  . Drug use: No  . Sexual activity: Yes    Partners: Male    Birth control/protection: I.U.D.    Comment: Mirena IUD

## 2020-11-23 ENCOUNTER — Ambulatory Visit: Payer: 59 | Admitting: Internal Medicine

## 2020-12-03 ENCOUNTER — Ambulatory Visit: Payer: 59 | Admitting: Orthopedic Surgery

## 2020-12-22 ENCOUNTER — Ambulatory Visit: Payer: 59 | Admitting: Internal Medicine

## 2021-01-06 ENCOUNTER — Encounter: Payer: Self-pay | Admitting: Internal Medicine

## 2021-01-06 ENCOUNTER — Ambulatory Visit: Payer: 59 | Admitting: Internal Medicine

## 2021-01-06 ENCOUNTER — Other Ambulatory Visit: Payer: Self-pay

## 2021-01-06 VITALS — BP 136/88 | HR 61 | Temp 98.6°F | Resp 16 | Ht 64.0 in | Wt 229.0 lb

## 2021-01-06 DIAGNOSIS — M545 Low back pain, unspecified: Secondary | ICD-10-CM | POA: Insufficient documentation

## 2021-01-06 DIAGNOSIS — M7061 Trochanteric bursitis, right hip: Secondary | ICD-10-CM | POA: Diagnosis not present

## 2021-01-06 DIAGNOSIS — F331 Major depressive disorder, recurrent, moderate: Secondary | ICD-10-CM | POA: Diagnosis not present

## 2021-01-06 DIAGNOSIS — M7062 Trochanteric bursitis, left hip: Secondary | ICD-10-CM

## 2021-01-06 DIAGNOSIS — Z1231 Encounter for screening mammogram for malignant neoplasm of breast: Secondary | ICD-10-CM

## 2021-01-06 DIAGNOSIS — Z1211 Encounter for screening for malignant neoplasm of colon: Secondary | ICD-10-CM

## 2021-01-06 MED ORDER — ESCITALOPRAM OXALATE 5 MG PO TABS: 5.0000 mg | ORAL_TABLET | Freq: Every day | ORAL | 0 refills | Status: DC

## 2021-01-06 MED ORDER — MELOXICAM 15 MG PO TABS: 15.0000 mg | ORAL_TABLET | Freq: Every day | ORAL | 1 refills | Status: DC

## 2021-01-06 MED ORDER — DULOXETINE HCL 30 MG PO CPEP: 30.0000 mg | ORAL_CAPSULE | Freq: Every day | ORAL | 0 refills | Status: DC

## 2021-01-06 NOTE — Progress Notes (Signed)
Subjective:  Patient ID: Danielle Cook, female    DOB: [birth year]  Age: 45 y.o. MRN: [id number]  CC: Depression  This visit occurred during the SARS-CoV-2 public health emergency.  Safety protocols were in place, including screening questions prior to the visit, additional usage of staff PPE, and extensive cleaning of exam room while observing appropriate contact time as indicated for disinfecting solutions.    HPI Danielle Cook presents for f/up - - She complains of worsening s/s of depression s/p multiple stressors (apathy, passive SI with no plan, anhedonia, weight gain, crying spells, fatigue, insomnia, and chronic MSK pain).   Outpatient Medications Prior to Visit  Medication Sig Dispense Refill  . augmented betamethasone dipropionate (DIPROLENE-AF) 0.05 % cream Apply 1 application topically 2 (two) times daily. 30 g 1  . gabapentin (NEURONTIN) 100 MG capsule Take 2 capsules (200 mg total) by mouth at bedtime. (Patient taking differently: Take 100-300 mg by mouth See admin instructions. 100 mg in the morning if needed, 300 mg at bedtime) 180 capsule 3  . GARLIC PO Take 1 tablet by mouth daily at 12 noon. 100 mg/day    . levonorgestrel (MIRENA) 20 MCG/24HR IUD 1 Intra Uterine Device (1 each total) by Intrauterine route once. 1 each 0  . Lifitegrast (XIIDRA) 5 % SOLN Place 1 drop into both eyes in the morning and at bedtime.    . Misc Natural Products (ELDERBERRY ZINC/VIT C/IMMUNE MT) Take 1 tablet by mouth daily.    . Omega-3 Fatty Acids (FISH OIL PO) Take 1,500 mg by mouth daily.    . Vitamin D, Ergocalciferol, (DRISDOL) 1.25 MG (50000 UNIT) CAPS capsule TAKE 1 CAPSULE BY MOUTH EVERY 7 DAYS 4 capsule 2  . escitalopram (LEXAPRO) 20 MG tablet TAKE 1 TABLET BY MOUTH EVERY DAY (Patient taking differently: Take 20 mg by mouth daily.) 30 tablet 5  . meloxicam (MOBIC) 15 MG tablet TAKE 1 TABLET BY MOUTH EVERY DAY (Patient taking differently: Take 15 mg by mouth 4 (four) times a week.)  30 tablet 11  . orphenadrine (NORFLEX) 100 MG tablet Take 1 tablet (100 mg total) by mouth 2 (two) times daily as needed for muscle spasms. (Patient taking differently: Take 100 mg by mouth at bedtime.) 40 tablet 0  . traMADol (ULTRAM) 50 MG tablet Take 1-2 tablets (50-100 mg total) by mouth every 6 (six) hours as needed. 20 tablet 0   No facility-administered medications prior to visit.    ROS Review of Systems  Constitutional: Positive for fatigue and unexpected weight change. Negative for appetite change, chills, diaphoresis and fever.  HENT: Negative.   Eyes: Negative.   Respiratory: Negative for cough, chest tightness, shortness of breath and wheezing.   Cardiovascular: Negative for chest pain, palpitations and leg swelling.  Gastrointestinal: Negative for abdominal pain, constipation, diarrhea, nausea and vomiting.  Endocrine: Negative.   Genitourinary: Negative.  Negative for difficulty urinating.  Musculoskeletal: Positive for arthralgias. Negative for myalgias.  Skin: Negative.   Hematological: Negative for adenopathy. Does not bruise/bleed easily.  Psychiatric/Behavioral: Positive for dysphoric mood, sleep disturbance and suicidal ideas. Negative for agitation, behavioral problems, confusion, decreased concentration and self-injury. The patient is not nervous/anxious and is not hyperactive.     Objective:  BP 136/88   Pulse 61   Temp 98.6 F (37 C) (Oral)   Resp 16   Ht 5\' 4"  (1.626 m)   Wt 229 lb (103.9 kg)   SpO2 97%   BMI 39.31 kg/m   BP  Readings from Last 3 Encounters:  01/06/21 136/88  10/01/20 (!) 168/91  08/24/20 (!) 144/88    Wt Readings from Last 3 Encounters:  01/06/21 229 lb (103.9 kg)  10/01/20 229 lb (103.9 kg)  08/24/20 231 lb (104.8 kg)    Physical Exam Vitals reviewed.  Constitutional:      Appearance: Normal appearance.  HENT:     Nose: Nose normal.     Mouth/Throat:     Mouth: Mucous membranes are moist.  Eyes:     General: No  scleral icterus.    Conjunctiva/sclera: Conjunctivae normal.  Cardiovascular:     Rate and Rhythm: Normal rate and regular rhythm.     Heart sounds: No murmur heard.   Pulmonary:     Effort: Pulmonary effort is normal.     Breath sounds: No stridor. No wheezing, rhonchi or rales.  Abdominal:     General: Abdomen is flat. Bowel sounds are normal. There is no distension.     Palpations: Abdomen is soft. There is no hepatomegaly, splenomegaly or mass.     Tenderness: There is no abdominal tenderness.  Musculoskeletal:        General: Normal range of motion.     Cervical back: Neck supple.     Right lower leg: No edema.     Left lower leg: No edema.  Lymphadenopathy:     Cervical: No cervical adenopathy.  Skin:    General: Skin is warm and dry.     Coloration: Skin is not pale.  Neurological:     General: No focal deficit present.  Psychiatric:        Attention and Perception: Attention and perception normal.        Mood and Affect: Mood is depressed. Mood is not anxious. Affect is tearful. Affect is not inappropriate.        Speech: Speech normal. She is communicative. Speech is not delayed or tangential.        Behavior: Behavior normal. Behavior is not slowed, aggressive or withdrawn. Behavior is cooperative.        Thought Content: Thought content is not paranoid or delusional. Thought content does not include homicidal or suicidal ideation. Thought content does not include homicidal or suicidal plan.        Cognition and Memory: Cognition normal.        Judgment: Judgment normal.     Lab Results  Component Value Date   WBC 6.6 10/01/2020   HGB 14.9 10/01/2020   HCT 44.2 10/01/2020   PLT 270 10/01/2020   GLUCOSE 92 10/01/2020   CHOL 235 (H) 08/24/2020   TRIG 147.0 08/24/2020   HDL 43.50 08/24/2020   LDLCALC 162 (H) 08/24/2020   ALT 16 08/13/2019   AST 16 08/13/2019   NA 137 10/01/2020   K 3.6 10/01/2020   CL 102 10/01/2020   CREATININE 0.54 10/01/2020   BUN 8  10/01/2020   CO2 24 10/01/2020   TSH 2.10 08/24/2020   MICROALBUR <0.7 08/24/2020    No results found.  Assessment & Plan:   Danielle Cook was seen today for depression.  Diagnoses and all orders for this visit:  Low back pain, unspecified back pain laterality, unspecified chronicity, unspecified whether sciatica present -     meloxicam (MOBIC) 15 MG tablet; Take 1 tablet (15 mg total) by mouth daily. -     DULoxetine (CYMBALTA) 30 MG capsule; Take 1 capsule (30 mg total) by mouth daily.  Greater trochanteric bursitis of both hips -  meloxicam (MOBIC) 15 MG tablet; Take 1 tablet (15 mg total) by mouth daily. -     DULoxetine (CYMBALTA) 30 MG capsule; Take 1 capsule (30 mg total) by mouth daily.  Moderate episode of recurrent major depressive disorder (HCC)- I have recommended that she transition to an SNRI - will slowly taper the SSRI and start an SNRI. -     escitalopram (LEXAPRO) 5 MG tablet; Take 1 tablet (5 mg total) by mouth daily. -     DULoxetine (CYMBALTA) 30 MG capsule; Take 1 capsule (30 mg total) by mouth daily.  Colon cancer screening -     Cologuard  Visit for screening mammogram -     MM DIGITAL SCREENING BILATERAL; Future   I have discontinued Anette Riedel. Hainsworth's escitalopram, orphenadrine, and traMADol. I have also changed her meloxicam. Additionally, I am having her start on escitalopram and DULoxetine. Lastly, I am having her maintain her levonorgestrel, gabapentin, Vitamin D (Ergocalciferol), Misc Natural Products (ELDERBERRY ZINC/VIT C/IMMUNE MT), Xiidra, GARLIC PO, Omega-3 Fatty Acids (FISH OIL PO), and augmented betamethasone dipropionate.  Meds ordered this encounter  Medications  . meloxicam (MOBIC) 15 MG tablet    Sig: Take 1 tablet (15 mg total) by mouth daily.    Dispense:  90 tablet    Refill:  1  . escitalopram (LEXAPRO) 5 MG tablet    Sig: Take 1 tablet (5 mg total) by mouth daily.    Dispense:  30 tablet    Refill:  0  . DULoxetine (CYMBALTA)  30 MG capsule    Sig: Take 1 capsule (30 mg total) by mouth daily.    Dispense:  30 capsule    Refill:  0     Follow-up: Return in about 4 weeks (around 02/03/2021).  Sanda Linger, MD

## 2021-01-06 NOTE — Patient Instructions (Signed)
Major Depressive Disorder, Adult Major depressive disorder (MDD) is a mental health condition. It may also be called clinical depression or unipolar depression. MDD causes symptoms of sadness, hopelessness, and loss of interest in things. These symptoms last most of the day, almost every day, for 2 weeks. MDD can also cause physical symptoms. It can interfere with relationships and with everyday activities, such as work, school, and activities that are usually pleasant. MDD may be mild, moderate, or severe. It may be single-episode MDD, which happens once, or recurrent MDD, which may occur multiple times. What are the causes? The exact cause of this condition is not known. MDD is most likely caused by a combination of things, which may include:  Your personality traits.  Learned or conditioned behaviors or thoughts or feelings that reinforce negativity.  Any alcohol or substance misuse.  Long-term (chronic) physical or mental health illness.  Going through a traumatic experience or major life changes. What increases the risk? The following factors may make someone more likely to develop MDD:  A family history of depression.  Being a woman.  Troubled family relationships.  Abnormally low levels of certain brain chemicals.  Traumatic or painful events in childhood, especially abuse or loss of a parent.  A lot of stress from life experiences, such as poor living conditions or discrimination.  Chronic physical illness or other mental health disorders. What are the signs or symptoms? The main symptoms of MDD usually include:  Constant depressed or irritable mood.  A loss of interest in things and activities. Other symptoms include:  Sleeping or eating too much or too little.  Unexplained weight gain or weight loss.  Tiredness or low energy.  Being agitated, restless, or weak.  Feeling hopeless, worthless, or guilty.  Trouble thinking clearly or making  decisions.  Thoughts of suicide or thoughts of harming others.  Isolating oneself or avoiding other people or activities.  Trouble completing tasks, work, or any normal obligations. Severe symptoms of this condition may include:  Psychotic depression.This may include false beliefs, or delusions. It may also include seeing, hearing, tasting, smelling, or feeling things that are not real (hallucinations).  Chronic depression or persistent depressive disorder. This is low-level depression that lasts for at least 2 years.  Melancholic depression, or feeling extremely sad and hopeless.  Catatonic depression, which includes trouble speaking and trouble moving. How is this diagnosed? This condition may be diagnosed based on:  Your symptoms.  Your medical and mental health history. You may be asked questions about your lifestyle, including any drug and alcohol use.  A physical exam.  Blood tests to rule out other conditions. MDD is confirmed if you have the following symptoms most of the day, nearly every day, in a 2-week period:  Either a depressed mood or loss of interest.  At least four other MDD symptoms. How is this treated? This condition is usually treated by mental health professionals, such as psychologists, psychiatrists, and clinical social workers. You may need more than one type of treatment. Treatment may include:  Psychotherapy, also called talk therapy or counseling. Types of psychotherapy include: ? Cognitive behavioral therapy (CBT). This teaches you to recognize unhealthy feelings, thoughts, and behaviors, and replace them with positive thoughts and actions. ? Interpersonal therapy (IPT). This helps you to improve the way you communicate with others or relate to them. ? Family therapy. This treatment includes members of your family.  Medicines to treat anxiety and depression. These medicines help to balance the brain chemicals   that affect your emotions.  Lifestyle  changes. You may be asked to: ? Limit alcohol use and avoid drug use. ? Get regular exercise. ? Get plenty of sleep. ? Make healthy eating choices. ? Spend more time outdoors.  Brain stimulation. This may be done if symptoms are very severe and other treatments have not worked. Examples of this treatment are electroconvulsive therapy and transcranial magnetic stimulation. Follow these instructions at home: Activity  Exercise regularly and spend time outdoors.  Find activities that you enjoy doing, and make time to do them.  Find healthy ways to manage stress, such as: ? Meditation or deep breathing. ? Spending time in nature. ? Journaling.  Return to your normal activities as told by your health care provider. Ask your health care provider what activities are safe for you. Alcohol and drug use  If you drink alcohol: ? Limit how much you use to:  0-1 drink a day for women who are not pregnant.  0-2 drinks a day for men. ? Be aware of how much alcohol is in your drink. In the [place] one drink equals one 12 oz bottle of beer (355 mL), one 5 oz glass of wine (148 mL), or one 1 oz glass of hard liquor (44 mL). ? Discuss your alcohol use with your health care provider. Alcohol can affect any antidepressant medicines you are taking.  Discuss any drug use with your health care provider. General instructions  Take over-the-counter and prescription medicines only as told by your health care provider.  Eat a healthy diet and get plenty of sleep.  Consider joining a support group. Your health care provider may be able to recommend one.  Keep all follow-up visits as told by your health care provider. This is important.   Where to find more information  National Alliance on Mental Illness: www.nami.org  [place] National Institute of Mental Health: www.nimh.nih.gov Contact a health care provider if:  Your symptoms get worse.  You develop new symptoms. Get help right away if:  You  self-harm.  You have serious thoughts about hurting yourself or others.  You hallucinate. If you ever feel like you may hurt yourself or others, or have thoughts about taking your own life, get help right away. Go to your nearest emergency department or:  Call your local emergency services (911 in the [place]  Call a suicide crisis helpline, such as the National Suicide Prevention Lifeline at 1-800-273-8255. This is open 24 hours a day in the [place]  Text the Crisis Text Line at 741741 (in the [place] Summary  Major depressive disorder (MDD) is a mental health condition. MDD causes symptoms of sadness, hopelessness, and loss of interest in things. These symptoms last most of the day, almost every day, for 2 weeks.  The symptoms of MDD can interfere with relationships and with everyday activities.  Treatments and support are available for people who develop MDD. You may need more than one type of treatment.  Get help right away if you have serious thoughts about hurting yourself or others. This information is not intended to replace advice given to you by your health care provider. Make sure you discuss any questions you have with your health care provider. Document Revised: 09/28/2019 Document Reviewed: 09/28/2019 Elsevier Patient Education  2021 [employer]  

## 2021-01-28 ENCOUNTER — Other Ambulatory Visit: Payer: Self-pay | Admitting: Internal Medicine

## 2021-01-28 DIAGNOSIS — M7061 Trochanteric bursitis, right hip: Secondary | ICD-10-CM

## 2021-01-28 DIAGNOSIS — M7062 Trochanteric bursitis, left hip: Secondary | ICD-10-CM

## 2021-01-28 DIAGNOSIS — M545 Low back pain, unspecified: Secondary | ICD-10-CM

## 2021-01-28 DIAGNOSIS — G8929 Other chronic pain: Secondary | ICD-10-CM

## 2021-01-28 DIAGNOSIS — F331 Major depressive disorder, recurrent, moderate: Secondary | ICD-10-CM

## 2021-01-28 DIAGNOSIS — F418 Other specified anxiety disorders: Secondary | ICD-10-CM

## 2021-01-28 MED ORDER — DULOXETINE HCL 60 MG PO CPEP: 60.0000 mg | ORAL_CAPSULE | Freq: Every day | ORAL | 1 refills | Status: DC

## 2021-01-29 ENCOUNTER — Other Ambulatory Visit: Payer: Self-pay | Admitting: Internal Medicine

## 2021-01-29 DIAGNOSIS — F331 Major depressive disorder, recurrent, moderate: Secondary | ICD-10-CM

## 2021-02-01 ENCOUNTER — Other Ambulatory Visit: Payer: Self-pay | Admitting: Internal Medicine

## 2021-02-01 DIAGNOSIS — F418 Other specified anxiety disorders: Secondary | ICD-10-CM

## 2021-02-02 ENCOUNTER — Other Ambulatory Visit: Payer: Self-pay

## 2021-02-03 ENCOUNTER — Other Ambulatory Visit: Payer: Self-pay

## 2021-02-03 ENCOUNTER — Encounter: Payer: Self-pay | Admitting: Internal Medicine

## 2021-02-03 ENCOUNTER — Ambulatory Visit (INDEPENDENT_AMBULATORY_CARE_PROVIDER_SITE_OTHER): Payer: Commercial Managed Care - PPO | Admitting: Internal Medicine

## 2021-02-03 VITALS — BP 126/84 | HR 76 | Temp 98.3°F | Resp 16 | Ht 64.0 in | Wt 225.0 lb

## 2021-02-03 DIAGNOSIS — F418 Other specified anxiety disorders: Secondary | ICD-10-CM | POA: Diagnosis not present

## 2021-02-03 DIAGNOSIS — I10 Essential (primary) hypertension: Secondary | ICD-10-CM | POA: Diagnosis not present

## 2021-02-03 MED ORDER — BUSPIRONE HCL 5 MG PO TABS: 5.0000 mg | ORAL_TABLET | Freq: Two times a day (BID) | ORAL | 0 refills | Status: DC

## 2021-02-03 NOTE — Patient Instructions (Signed)
Major Depressive Disorder, Adult Major depressive disorder (MDD) is a mental health condition. It may also be called clinical depression or unipolar depression. MDD causes symptoms of sadness, hopelessness, and loss of interest in things. These symptoms last most of the day, almost every day, for 2 weeks. MDD can also cause physical symptoms. It can interfere with relationships and with everyday activities, such as work, school, and activities that are usually pleasant. MDD may be mild, moderate, or severe. It may be single-episode MDD, which happens once, or recurrent MDD, which may occur multiple times. What are the causes? The exact cause of this condition is not known. MDD is most likely caused by a combination of things, which may include:  Your personality traits.  Learned or conditioned behaviors or thoughts or feelings that reinforce negativity.  Any alcohol or substance misuse.  Long-term (chronic) physical or mental health illness.  Going through a traumatic experience or major life changes. What increases the risk? The following factors may make someone more likely to develop MDD:  A family history of depression.  Being a woman.  Troubled family relationships.  Abnormally low levels of certain brain chemicals.  Traumatic or painful events in childhood, especially abuse or loss of a parent.  A lot of stress from life experiences, such as poor living conditions or discrimination.  Chronic physical illness or other mental health disorders. What are the signs or symptoms? The main symptoms of MDD usually include:  Constant depressed or irritable mood.  A loss of interest in things and activities. Other symptoms include:  Sleeping or eating too much or too little.  Unexplained weight gain or weight loss.  Tiredness or low energy.  Being agitated, restless, or weak.  Feeling hopeless, worthless, or guilty.  Trouble thinking clearly or making  decisions.  Thoughts of suicide or thoughts of harming others.  Isolating oneself or avoiding other people or activities.  Trouble completing tasks, work, or any normal obligations. Severe symptoms of this condition may include:  Psychotic depression.This may include false beliefs, or delusions. It may also include seeing, hearing, tasting, smelling, or feeling things that are not real (hallucinations).  Chronic depression or persistent depressive disorder. This is low-level depression that lasts for at least 2 years.  Melancholic depression, or feeling extremely sad and hopeless.  Catatonic depression, which includes trouble speaking and trouble moving. How is this diagnosed? This condition may be diagnosed based on:  Your symptoms.  Your medical and mental health history. You may be asked questions about your lifestyle, including any drug and alcohol use.  A physical exam.  Blood tests to rule out other conditions. MDD is confirmed if you have the following symptoms most of the day, nearly every day, in a 2-week period:  Either a depressed mood or loss of interest.  At least four other MDD symptoms. How is this treated? This condition is usually treated by mental health professionals, such as psychologists, psychiatrists, and clinical social workers. You may need more than one type of treatment. Treatment may include:  Psychotherapy, also called talk therapy or counseling. Types of psychotherapy include: ? Cognitive behavioral therapy (CBT). This teaches you to recognize unhealthy feelings, thoughts, and behaviors, and replace them with positive thoughts and actions. ? Interpersonal therapy (IPT). This helps you to improve the way you communicate with others or relate to them. ? Family therapy. This treatment includes members of your family.  Medicines to treat anxiety and depression. These medicines help to balance the brain chemicals   that affect your emotions.  Lifestyle  changes. You may be asked to: ? Limit alcohol use and avoid drug use. ? Get regular exercise. ? Get plenty of sleep. ? Make healthy eating choices. ? Spend more time outdoors.  Brain stimulation. This may be done if symptoms are very severe and other treatments have not worked. Examples of this treatment are electroconvulsive therapy and transcranial magnetic stimulation. Follow these instructions at home: Activity  Exercise regularly and spend time outdoors.  Find activities that you enjoy doing, and make time to do them.  Find healthy ways to manage stress, such as: ? Meditation or deep breathing. ? Spending time in nature. ? Journaling.  Return to your normal activities as told by your health care provider. Ask your health care provider what activities are safe for you. Alcohol and drug use  If you drink alcohol: ? Limit how much you use to:  0-1 drink a day for women who are not pregnant.  0-2 drinks a day for men. ? Be aware of how much alcohol is in your drink. In the [place] one drink equals one 12 oz bottle of beer (355 mL), one 5 oz glass of wine (148 mL), or one 1 oz glass of hard liquor (44 mL). ? Discuss your alcohol use with your health care provider. Alcohol can affect any antidepressant medicines you are taking.  Discuss any drug use with your health care provider. General instructions  Take over-the-counter and prescription medicines only as told by your health care provider.  Eat a healthy diet and get plenty of sleep.  Consider joining a support group. Your health care provider may be able to recommend one.  Keep all follow-up visits as told by your health care provider. This is important.   Where to find more information  National Alliance on Mental Illness: www.nami.org  [place] National Institute of Mental Health: www.nimh.nih.gov Contact a health care provider if:  Your symptoms get worse.  You develop new symptoms. Get help right away if:  You  self-harm.  You have serious thoughts about hurting yourself or others.  You hallucinate. If you ever feel like you may hurt yourself or others, or have thoughts about taking your own life, get help right away. Go to your nearest emergency department or:  Call your local emergency services (911 in the [place]  Call a suicide crisis helpline, such as the National Suicide Prevention Lifeline at 1-800-273-8255. This is open 24 hours a day in the [place]  Text the Crisis Text Line at 741741 (in the [place] Summary  Major depressive disorder (MDD) is a mental health condition. MDD causes symptoms of sadness, hopelessness, and loss of interest in things. These symptoms last most of the day, almost every day, for 2 weeks.  The symptoms of MDD can interfere with relationships and with everyday activities.  Treatments and support are available for people who develop MDD. You may need more than one type of treatment.  Get help right away if you have serious thoughts about hurting yourself or others. This information is not intended to replace advice given to you by your health care provider. Make sure you discuss any questions you have with your health care provider. Document Revised: 09/28/2019 Document Reviewed: 09/28/2019 Elsevier Patient Education  2021 [employer]  

## 2021-02-03 NOTE — Progress Notes (Signed)
Subjective:  Patient ID: Danielle Cook, female    DOB: [birth year]  Age: 45 y.o. MRN: [id number]  CC: Depression  This visit occurred during the SARS-CoV-2 public health emergency.  Safety protocols were in place, including screening questions prior to the visit, additional usage of staff PPE, and extensive cleaning of exam room while observing appropriate contact time as indicated for disinfecting solutions.    HPI Danielle Cook presents for f/up -   She is tapering down the dose of Lexapro and has increase the dose of Cymbalta to 60 mg a day.  She tells me her mood is getting better and she is experiencing less pain.  She continues to struggle with anxiety and would like to try BuSpar.  Outpatient Medications Prior to Visit  Medication Sig Dispense Refill  . augmented betamethasone dipropionate (DIPROLENE-AF) 0.05 % cream Apply 1 application topically 2 (two) times daily. 30 g 1  . DULoxetine (CYMBALTA) 60 MG capsule Take 1 capsule (60 mg total) by mouth daily. 90 capsule 1  . GARLIC PO Take 1 tablet by mouth daily at 12 noon. 100 mg/day    . levonorgestrel (MIRENA) 20 MCG/24HR IUD 1 Intra Uterine Device (1 each total) by Intrauterine route once. 1 each 0  . Lifitegrast (XIIDRA) 5 % SOLN Place 1 drop into both eyes in the morning and at bedtime.    . meloxicam (MOBIC) 15 MG tablet Take 1 tablet (15 mg total) by mouth daily. 90 tablet 1  . Misc Natural Products (ELDERBERRY ZINC/VIT C/IMMUNE MT) Take 1 tablet by mouth daily.    . Omega-3 Fatty Acids (FISH OIL PO) Take 1,500 mg by mouth daily.    Marland Kitchen gabapentin (NEURONTIN) 100 MG capsule Take 2 capsules (200 mg total) by mouth at bedtime. (Patient taking differently: Take 100-300 mg by mouth See admin instructions. 100 mg in the morning if needed, 300 mg at bedtime) 180 capsule 3  . Vitamin D, Ergocalciferol, (DRISDOL) 1.25 MG (50000 UNIT) CAPS capsule TAKE 1 CAPSULE BY MOUTH EVERY 7 DAYS 4 capsule 2   No facility-administered  medications prior to visit.    ROS Review of Systems  Constitutional: Negative for diaphoresis and fatigue.  HENT: Negative.   Eyes: Negative for visual disturbance.  Respiratory: Negative for cough, chest tightness, shortness of breath and wheezing.   Cardiovascular: Negative for chest pain, palpitations and leg swelling.  Gastrointestinal: Negative for abdominal pain, diarrhea and nausea.  Endocrine: Negative.   Genitourinary: Negative.  Negative for difficulty urinating.  Musculoskeletal: Positive for arthralgias and back pain. Negative for myalgias.  Skin: Negative.   Neurological: Negative.  Negative for dizziness, weakness, light-headedness and headaches.  Hematological: Negative for adenopathy. Does not bruise/bleed easily.  Psychiatric/Behavioral: Negative for confusion, decreased concentration, dysphoric mood, hallucinations, sleep disturbance and suicidal ideas. The patient is nervous/anxious.     Objective:  BP 126/84   Pulse 76   Temp 98.3 F (36.8 C) (Oral)   Resp 16   Ht 5\' 4"  (1.626 m)   Wt 225 lb (102.1 kg)   SpO2 96%   BMI 38.62 kg/m   BP Readings from Last 3 Encounters:  02/03/21 126/84  01/06/21 136/88  10/01/20 (!) 168/91    Wt Readings from Last 3 Encounters:  02/03/21 225 lb (102.1 kg)  01/06/21 229 lb (103.9 kg)  10/01/20 229 lb (103.9 kg)    Physical Exam Vitals reviewed.  HENT:     Nose: Nose normal.     Mouth/Throat:  Mouth: Mucous membranes are moist.  Eyes:     General: No scleral icterus.    Conjunctiva/sclera: Conjunctivae normal.  Cardiovascular:     Rate and Rhythm: Normal rate and regular rhythm.     Heart sounds: No murmur heard.   Pulmonary:     Effort: Pulmonary effort is normal.     Breath sounds: No stridor. No wheezing, rhonchi or rales.  Abdominal:     General: Abdomen is flat. Bowel sounds are normal. There is no distension.     Palpations: Abdomen is soft. There is no hepatomegaly, splenomegaly or mass.   Musculoskeletal:        General: Normal range of motion.     Cervical back: Neck supple.     Right lower leg: No edema.     Left lower leg: No edema.  Lymphadenopathy:     Cervical: No cervical adenopathy.  Skin:    General: Skin is warm and dry.  Neurological:     General: No focal deficit present.     Mental Status: She is alert and oriented to person, place, and time. Mental status is at baseline.  Psychiatric:        Mood and Affect: Mood normal.        Behavior: Behavior normal.        Thought Content: Thought content normal.        Judgment: Judgment normal.     Lab Results  Component Value Date   WBC 6.6 10/01/2020   HGB 14.9 10/01/2020   HCT 44.2 10/01/2020   PLT 270 10/01/2020   GLUCOSE 92 10/01/2020   CHOL 235 (H) 08/24/2020   TRIG 147.0 08/24/2020   HDL 43.50 08/24/2020   LDLCALC 162 (H) 08/24/2020   ALT 16 08/13/2019   AST 16 08/13/2019   NA 137 10/01/2020   K 3.6 10/01/2020   CL 102 10/01/2020   CREATININE 0.54 10/01/2020   BUN 8 10/01/2020   CO2 24 10/01/2020   TSH 2.10 08/24/2020   MICROALBUR <0.7 08/24/2020    No results found.  Assessment & Plan:   Lenzie was seen today for depression.  Diagnoses and all orders for this visit:  Primary hypertension- Her blood pressure is adequately well controlled.  Depression with anxiety- Will add buspirone to duloxetine. -     busPIRone (BUSPAR) 5 MG tablet; Take 1 tablet (5 mg total) by mouth 2 (two) times daily.   I have discontinued Anette Riedel. Trombly's gabapentin and Vitamin D (Ergocalciferol). I am also having her start on busPIRone. Additionally, I am having her maintain her levonorgestrel, Misc Natural Products (ELDERBERRY ZINC/VIT C/IMMUNE MT), Xiidra, GARLIC PO, Omega-3 Fatty Acids (FISH OIL PO), augmented betamethasone dipropionate, meloxicam, and DULoxetine.  Meds ordered this encounter  Medications  . busPIRone (BUSPAR) 5 MG tablet    Sig: Take 1 tablet (5 mg total) by mouth 2 (two) times  daily.    Dispense:  60 tablet    Refill:  0     Follow-up: Return in about 6 months (around 08/05/2021).  Sanda Linger, MD

## 2021-02-04 LAB — COLOGUARD: Cologuard: NEGATIVE

## 2021-02-05 ENCOUNTER — Encounter: Payer: Self-pay | Admitting: Internal Medicine

## 2021-02-11 LAB — COLOGUARD: COLOGUARD: NEGATIVE

## 2021-02-15 ENCOUNTER — Encounter: Payer: Self-pay | Admitting: Internal Medicine

## 2021-02-25 ENCOUNTER — Other Ambulatory Visit: Payer: Self-pay | Admitting: Internal Medicine

## 2021-02-25 DIAGNOSIS — F418 Other specified anxiety disorders: Secondary | ICD-10-CM

## 2021-03-03 NOTE — Progress Notes (Signed)
[name] [address] [place] [address] Phone: [phone] Subjective:   I Ronelle Nigh am serving as a Neurosurgeon for Dr. Antoine Primas.  This visit occurred during the SARS-CoV-2 public health emergency.  Safety protocols were in place, including screening questions prior to the visit, additional usage of staff PPE, and extensive cleaning of exam room while observing appropriate contact time as indicated for disinfecting solutions.   I'm seeing this patient by the request  of:  Etta Grandchild, MD  CC: hip pain   WHQ:[id number]   04/24/2020  Elsye Mccollister Petree is a 45 y.o. female coming in with complaint of right hip pain. States she usually gets an injection.  Onset- Chronic  Location - lateral Character-dull achy Aggravating factors- side lunges  Therapies tried- meloxicam Severity- 6-7/10 at its worse      Past Medical History:  Diagnosis Date  . Abnormal Pap smear   . Allergy   . Anxiety   . Complication of anesthesia   . Depression   . History of chicken pox   . 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 Hyperlipidemia    diet controlled - no meds  . Migraine   . PONV (postoperative nausea and vomiting)   . PPD positive, treated 2004   currently no symptoms, chest xrays negative per pt 09/30/20   Past Surgical History:  Procedure Laterality Date  . ANTERIOR CRUCIATE LIGAMENT REPAIR Left 06/11/2020   Procedure: LEFT KNEE ANTERIOR CRUCIATE LIGAMENT (ACL) RECONSTRUCTION WITH WITH QUAD ALLOGRAFT, MENISCAL REPAIR vs DEBRIDEMENT;  Surgeon: Cammy Copa, MD;  Location: [facility] OR;  Service: Orthopedics;  Laterality: Left;  . CARPAL TUNNEL RELEASE Right 10/01/2020   Procedure: RIGHT CARPAL TUNNEL RELEASE;  Surgeon: Cammy Copa, MD;  Location: [facility];  Service: Orthopedics;  Laterality: Right;  . CHOLECYSTECTOMY, LAPAROSCOPIC  07/13/2000  . COLPOSCOPY  2000,   . EYE SURGERY Bilateral 07/2017   Lasik  . TONSILLECTOMY    . WISDOM TOOTH  EXTRACTION  1995 Jan   Social History   Socioeconomic History  . Marital status: Legally Separated    Spouse name: Not on file  . Number of children: Not on file  . Years of education: Not on file  . Highest education level: Not on file  Occupational History  . Not on file  Tobacco Use  . Smoking status: Current Every Day Smoker    Packs/day: 0.25    Years: 15.00    Pack years: 3.75    Types: E-cigarettes  . Smokeless tobacco: Never Used  Vaping Use  . Vaping Use: Never used  Substance and Sexual Activity  . Alcohol use: Not Currently    Comment: occasional  . Drug use: No  . Sexual activity: Yes    Partners: Male    Birth control/protection: I.U.D.    Comment: Mirena IUD  Other Topics Concern  . Not on file  Social History Narrative  . Not on file   Social Determinants of Health   Financial Resource Strain: Not on file  Food Insecurity: Not on file  Transportation Needs: Not on file  Physical Activity: Not on file  Stress: Not on file  Social Connections: Not on file   Allergies  Allergen Reactions  . Eggs Or Egg-Derived Products     Sensitivity   . Guaifenesin Er Anxiety and Palpitations  . Latex Hives and Rash  . Wellbutrin [Bupropion Hcl] Anxiety and Palpitations   Family History  Problem Relation Age of Onset  . Stroke Mother   . Dementia Mother   . Hyperlipidemia Mother   . Early death Father   . Heart disease Father   . Hyperlipidemia Father   . Cancer Neg Hx   . Depression Neg Hx   . Diabetes Neg Hx   . Drug abuse Neg Hx   . Hearing loss Neg Hx   . Hypertension Neg Hx     Current Outpatient Medications (Endocrine & Metabolic):  .  levonorgestrel (MIRENA) 20 MCG/24HR IUD, 1 Intra Uterine Device (1 each total) by Intrauterine route once.    Current Outpatient Medications (Analgesics):  .  meloxicam (MOBIC) 15 MG tablet, Take 1 tablet (15 mg total) by mouth daily.   Current Outpatient Medications (Other):  .  augmented betamethasone  dipropionate (DIPROLENE-AF) 0.05 % cream, Apply 1 application topically 2 (two) times daily. .  busPIRone (BUSPAR) 5 MG tablet, TAKE 1 TABLET BY MOUTH TWICE A DAY .  DULoxetine (CYMBALTA) 60 MG capsule, Take 1 capsule (60 mg total) by mouth daily. Marland Kitchen  GARLIC PO, Take 1 tablet by mouth daily at 12 noon. 100 mg/day .  Lifitegrast (XIIDRA) 5 % SOLN, Place 1 drop into both eyes in the morning and at bedtime. .  Misc Natural Products (ELDERBERRY ZINC/VIT C/IMMUNE MT), Take 1 tablet by mouth daily. .  Omega-3 Fatty Acids (FISH OIL PO), Take 1,500 mg by mouth daily.   Reviewed prior external information including notes and imaging from  primary care provider As well as notes that were available from care everywhere and other healthcare systems.  Past medical history, social, surgical and family history all reviewed in electronic medical record.  No pertanent information unless stated regarding to the chief complaint.   Review of Systems:  No headache, visual changes, nausea, vomiting, diarrhea, constipation, dizziness, abdominal pain, skin rash, fevers, chills, night sweats, weight loss, swollen lymph nodes, body aches, joint swelling, chest pain, shortness of breath, mood changes. POSITIVE muscle aches  Objective  Blood pressure (!) 150/88, pulse 72, height 5\' 4"  (1.626 m), weight 222 lb (100.7 kg), SpO2 98 %.   General: No apparent distress alert and oriented x3 mood and affect normal, dressed appropriately.  HEENT: Pupils equal, extraocular movements intact  Respiratory: Patient's speak in full sentences and does not appear short of breath  Cardiovascular: No lower extremity edema, non tender, no erythema  Gait normal with good balance and coordination.  MSK: Patient continues to have pain on the right greater trochanteric area.  Patient is severely tender but good range of motion of the hip.  Mild tightness noted in the lower back.  Negative straight leg test.   Procedure: Real-time  Ultrasound Guided Injection of right greater trochanteric bursitis secondary to patient's body habitus Device: GE Logiq Q7 Ultrasound guided injection is preferred based studies that show increased duration, increased effect, greater accuracy, decreased procedural pain, increased response rate, and decreased cost with ultrasound guided versus blind injection.  Verbal informed consent obtained.  Time-out conducted.  Noted no overlying erythema, induration, or other signs of local infection.  Skin prepped in a sterile fashion.  Local anesthesia: Topical Ethyl chloride.  With sterile technique and under real time ultrasound guidance:  Greater trochanteric area was visualized and patient's bursa was noted. A 22-gauge 3 inch needle was inserted and 4 cc of 0.5% Marcaine and 1 cc of Kenalog 40 mg/dL was injected. Pictures taken Completed without difficulty  Pain immediately resolved suggesting accurate placement of  the medication.  Advised to call if fevers/chills, erythema, induration, drainage, or persistent bleeding.  Impression: Technically successful ultrasound guided injection.     Impression and Recommendations:     The above documentation has been reviewed and is accurate and complete Judi Saa, DO

## 2021-03-04 ENCOUNTER — Ambulatory Visit (INDEPENDENT_AMBULATORY_CARE_PROVIDER_SITE_OTHER): Payer: Commercial Managed Care - PPO | Admitting: Family Medicine

## 2021-03-04 ENCOUNTER — Ambulatory Visit: Payer: Self-pay

## 2021-03-04 ENCOUNTER — Other Ambulatory Visit: Payer: Self-pay

## 2021-03-04 ENCOUNTER — Encounter: Payer: Self-pay | Admitting: Family Medicine

## 2021-03-04 VITALS — BP 150/88 | HR 72 | Ht 64.0 in | Wt 222.0 lb

## 2021-03-04 DIAGNOSIS — M545 Low back pain, unspecified: Secondary | ICD-10-CM | POA: Diagnosis not present

## 2021-03-04 DIAGNOSIS — M7062 Trochanteric bursitis, left hip: Secondary | ICD-10-CM | POA: Diagnosis not present

## 2021-03-04 DIAGNOSIS — M25551 Pain in right hip: Secondary | ICD-10-CM | POA: Diagnosis not present

## 2021-03-04 DIAGNOSIS — M7061 Trochanteric bursitis, right hip: Secondary | ICD-10-CM | POA: Diagnosis not present

## 2021-03-04 MED ORDER — MELOXICAM 15 MG PO TABS
15.0000 mg | ORAL_TABLET | Freq: Every day | ORAL | 1 refills | Status: DC
Start: 2021-03-04 — End: 2022-01-11

## 2021-03-04 NOTE — Assessment & Plan Note (Signed)
patient Recommended following procedure well, discussed icing regimen and home exercises, discussed avoiding certain activities.Patient can have refill of the meloxicam discussed icing regimen.  Patient does have side effects of the meloxicam.  Worsening pain do feel that injections will be necessary as well as formal physical therapy.  Follow-up again in 3 months

## 2021-03-04 NOTE — Patient Instructions (Addendum)
Good to see you Injection today Exercise 3 times a week Pennsaid topically See me again in 3 months if you need me

## 2021-03-07 ENCOUNTER — Encounter: Payer: Self-pay | Admitting: Internal Medicine

## 2021-03-08 ENCOUNTER — Other Ambulatory Visit: Payer: Self-pay | Admitting: Internal Medicine

## 2021-03-08 DIAGNOSIS — F418 Other specified anxiety disorders: Secondary | ICD-10-CM

## 2021-03-08 MED ORDER — BUSPIRONE HCL 10 MG PO TABS: 10.0000 mg | ORAL_TABLET | Freq: Two times a day (BID) | ORAL | 0 refills | Status: DC

## 2021-06-04 ENCOUNTER — Ambulatory Visit: Payer: Commercial Managed Care - PPO | Admitting: Family Medicine

## 2021-06-14 IMAGING — DX DG CERVICAL SPINE COMPLETE 4+V
5 series · 5 of 5 positions shown · non-contrast
Comparison: None.

CLINICAL DATA: Neck pain

EXAM:
CERVICAL SPINE - COMPLETE 4+ VIEW

[c-spine lat]
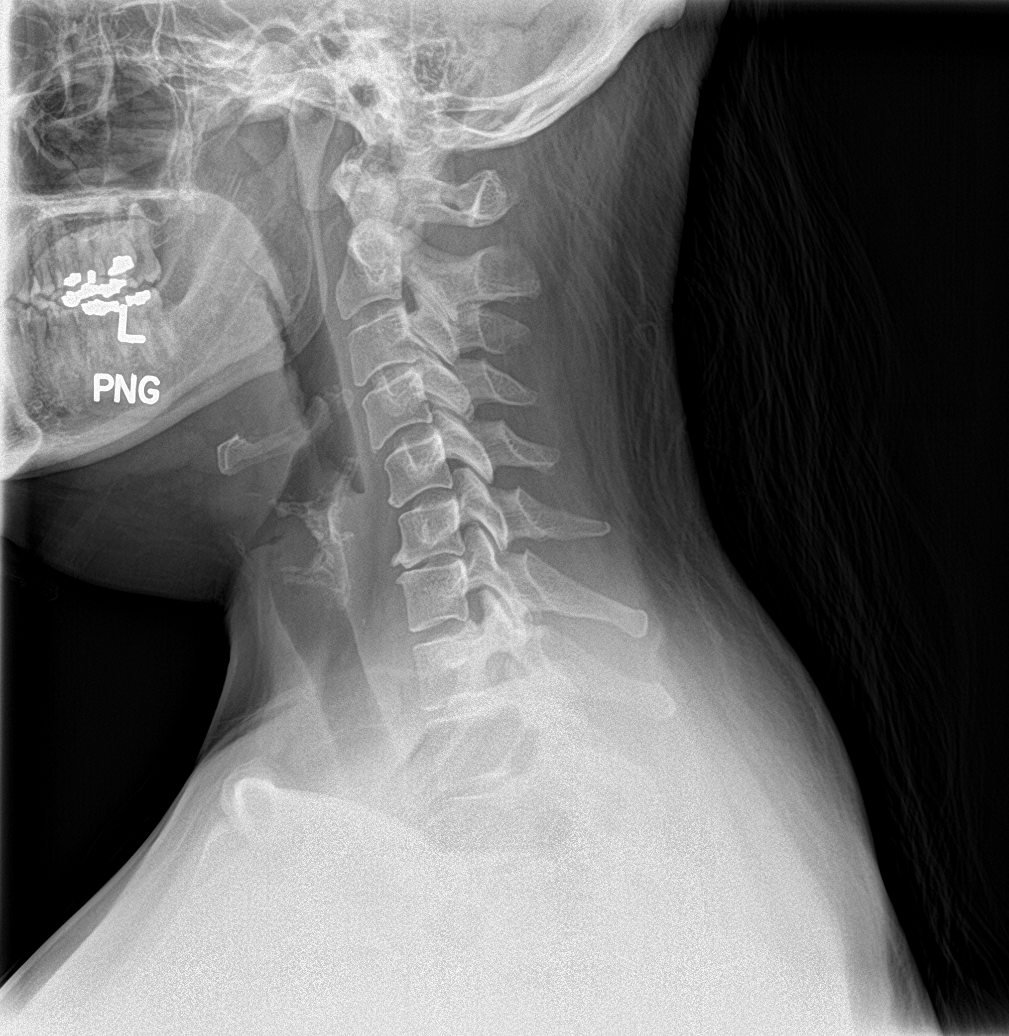

[c-spine obl (1 of 2)]
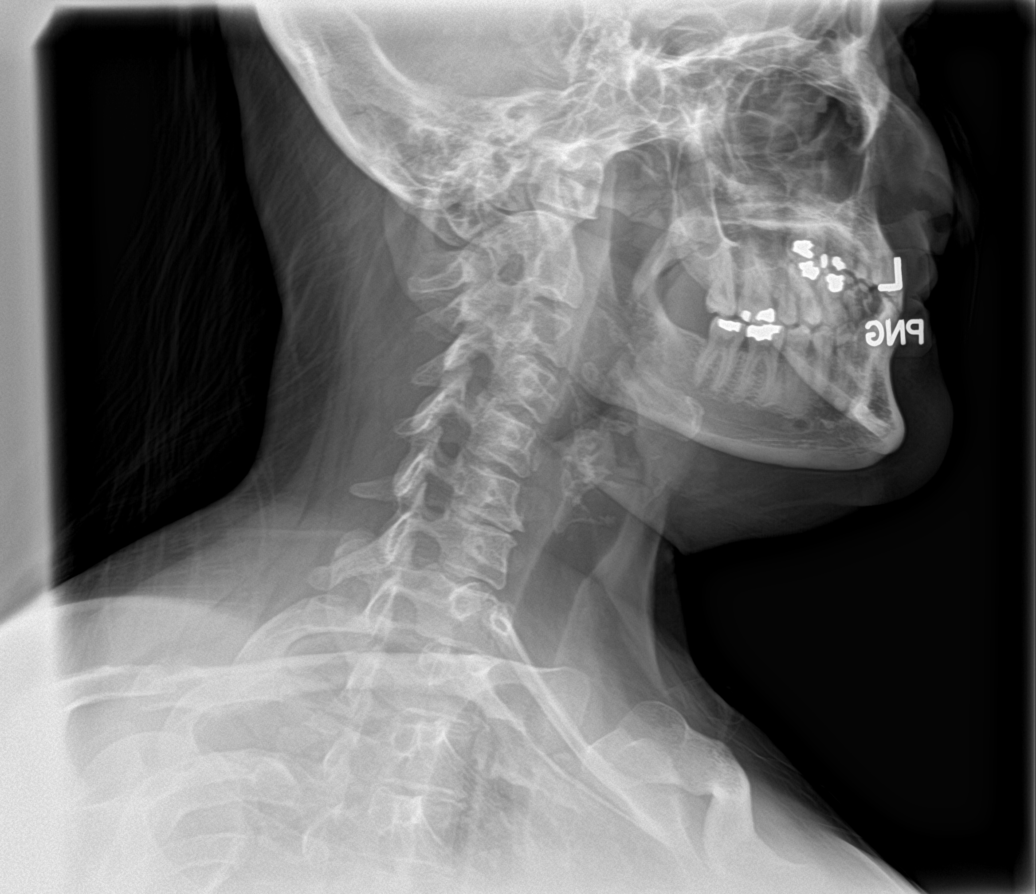

[c-spine obl (2 of 2)]
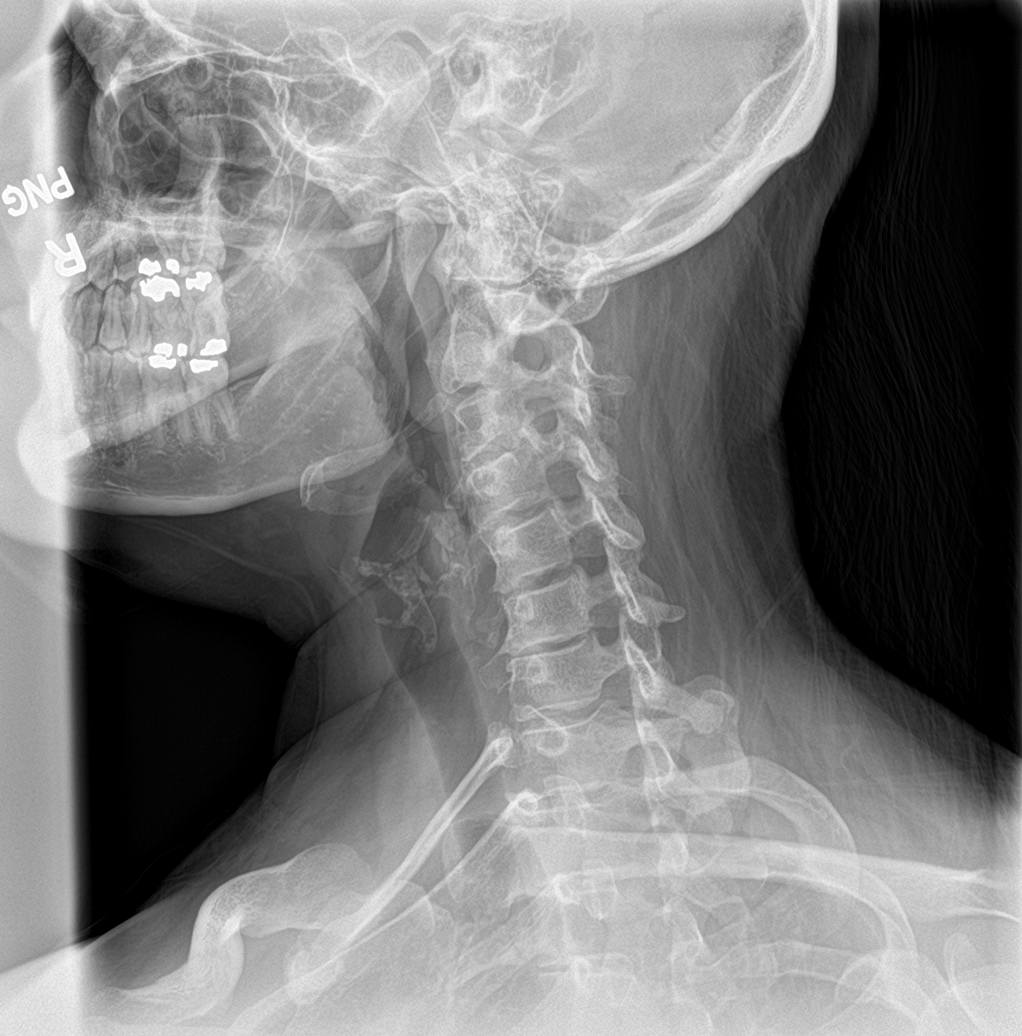

[c-spine ap]
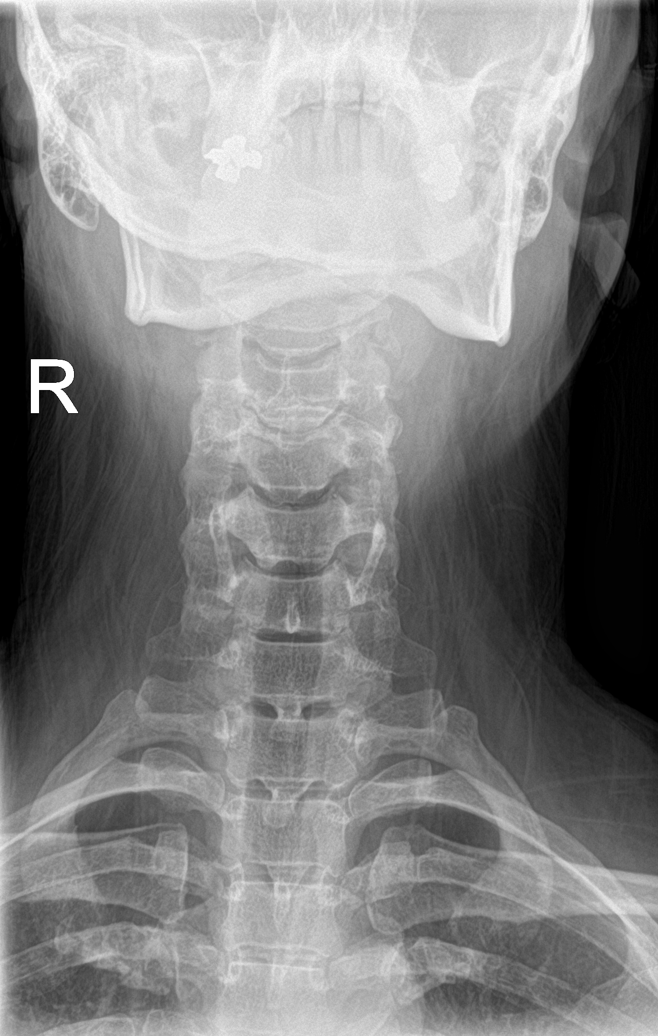

[c-spine open mouth]
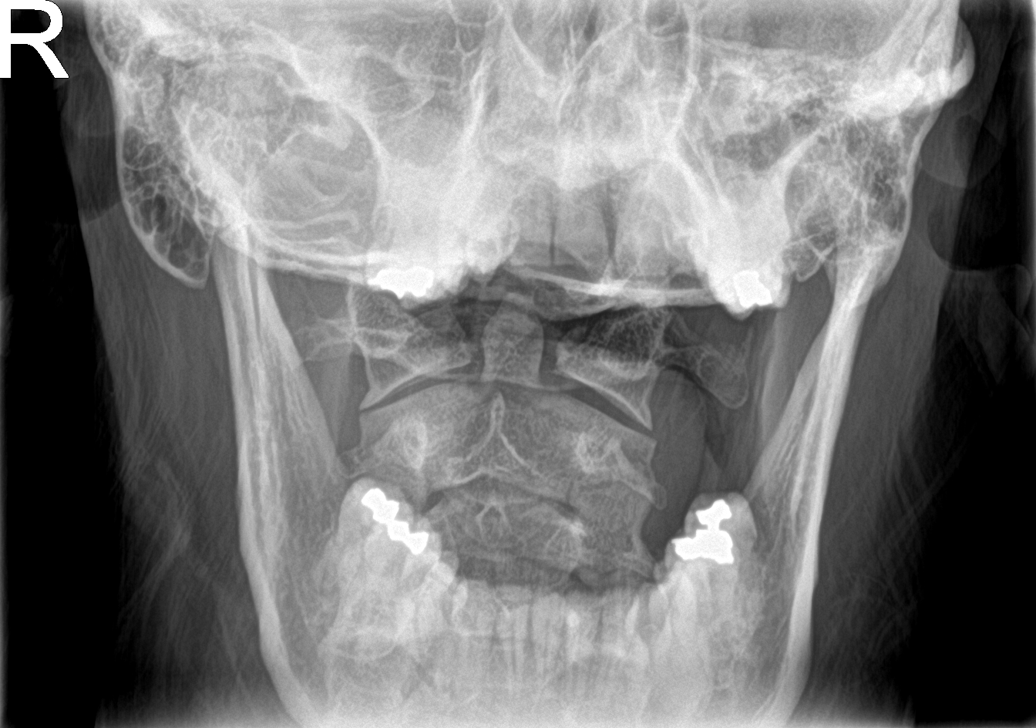

[5 of 5 positions shown; findings below may reference images not displayed]

FINDINGS: Reversal of cervical lordosis. Vertebral body heights are normal.
Mild degenerative changes at C6-C7. Dens and lateral masses are
within normal limits. Possible foraminal narrowing bilaterally C3-C4
right greater than left.
IMPRESSION: Mild degenerative changes.  No acute osseous abnormality.

## 2021-06-14 IMAGING — DX DG LUMBAR SPINE COMPLETE 4+V
5 series · 5 of 5 positions shown · non-contrast
Comparison: None.

CLINICAL DATA: Back pain

EXAM:
LUMBAR SPINE - COMPLETE 4+ VIEW

[l-spine ap]
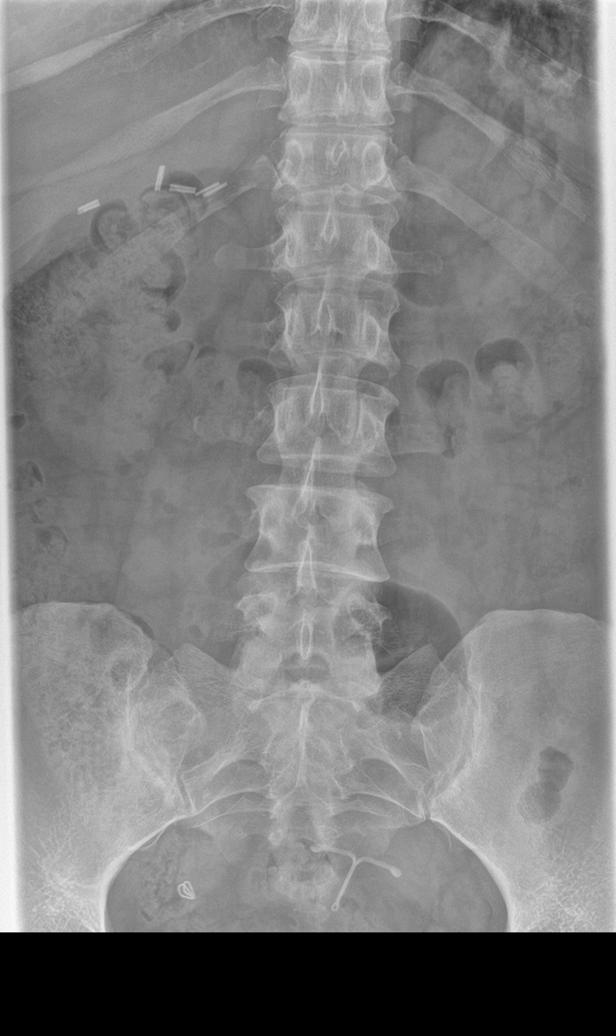

[l-spine obl (1 of 2)]
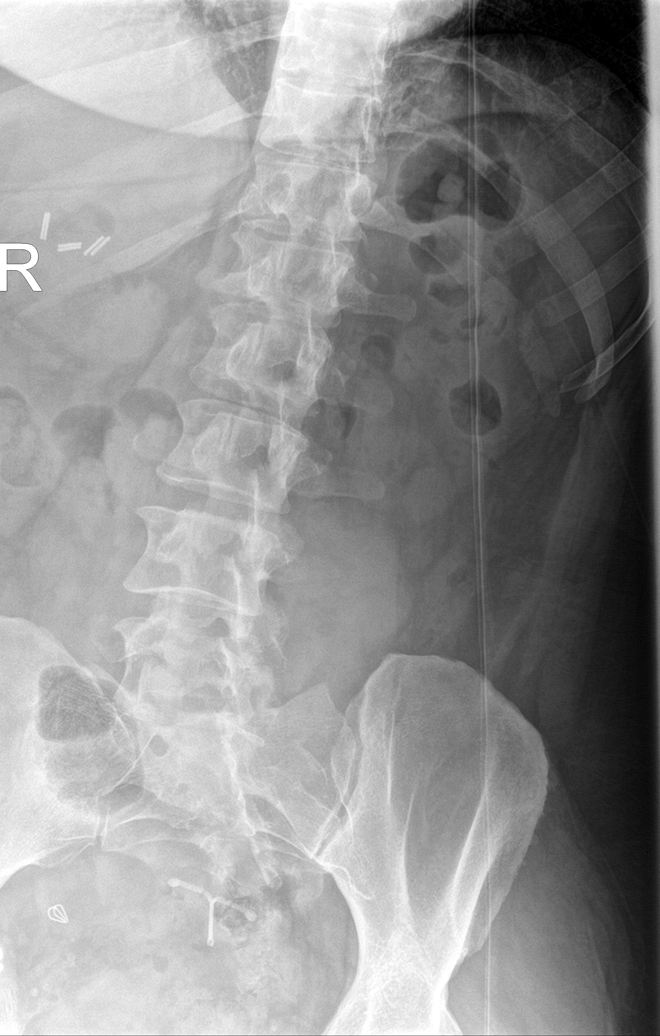

[l-spine obl (2 of 2)]
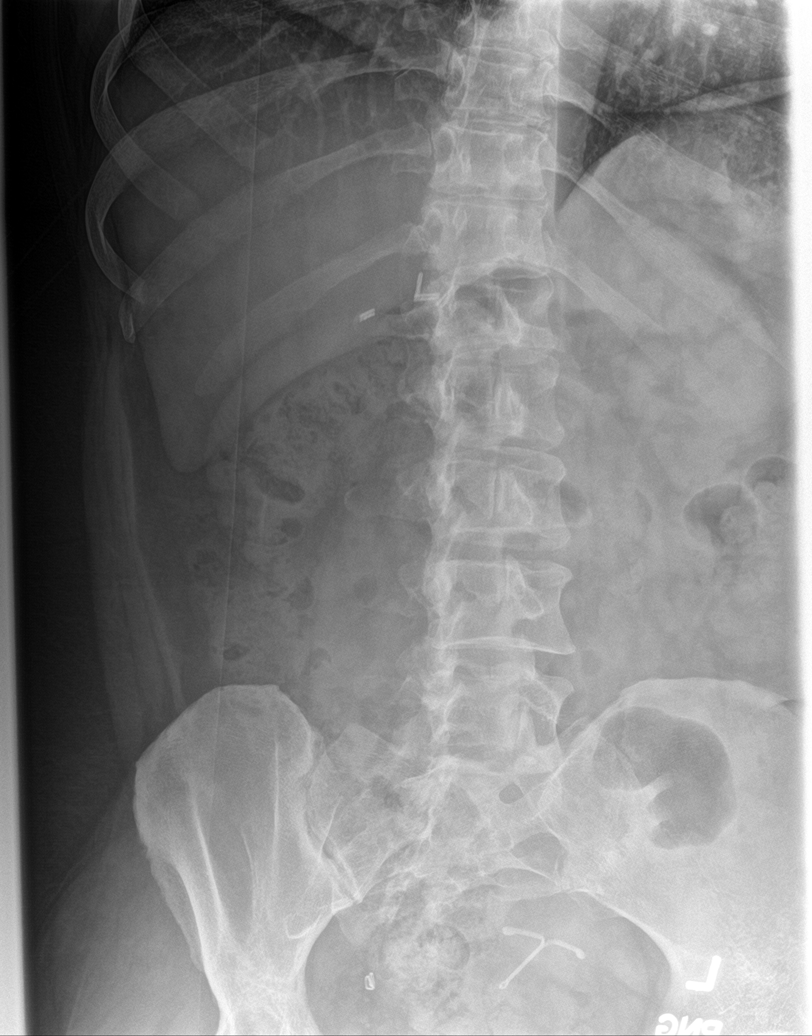

[l-spine lat]
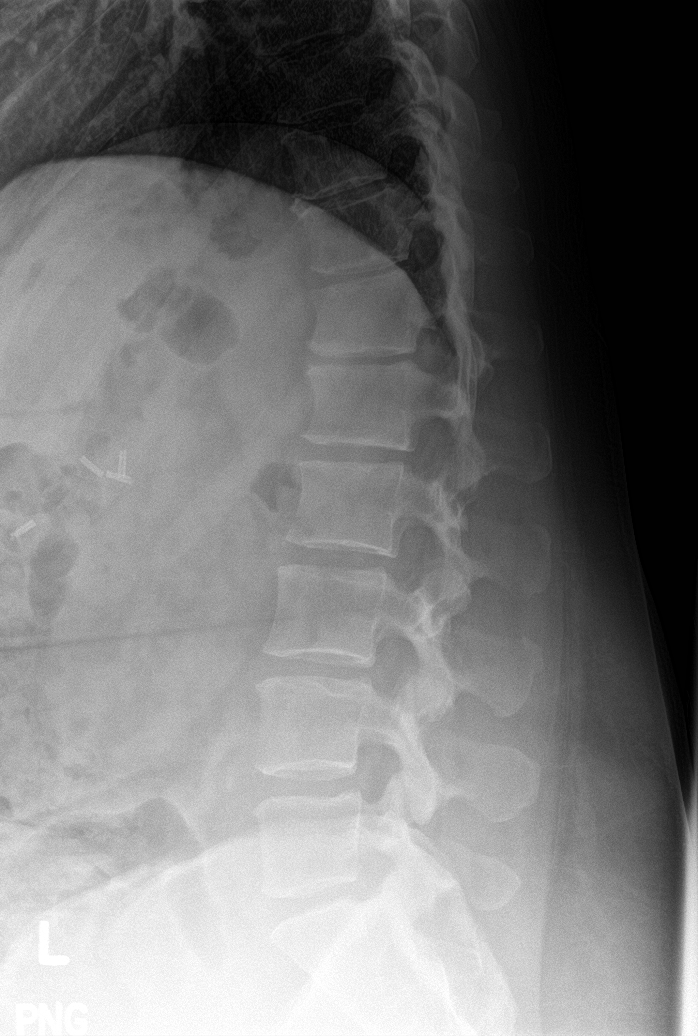

[l-spine spot]
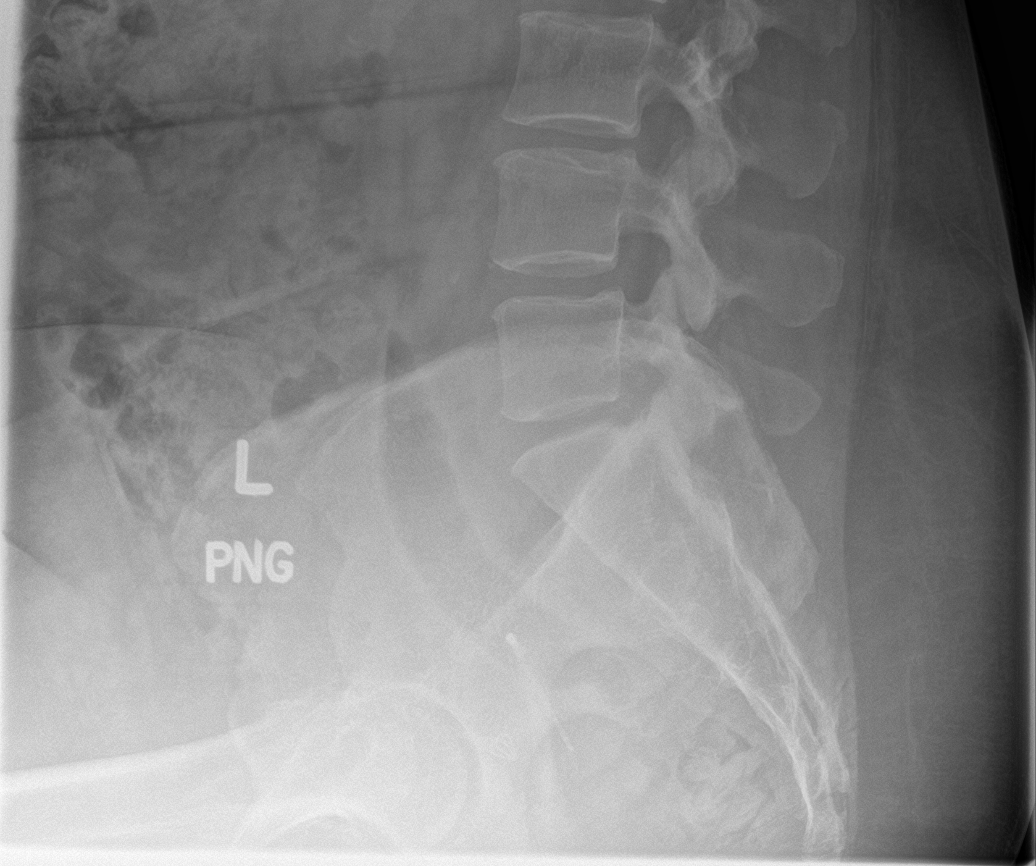

[5 of 5 positions shown; findings below may reference images not displayed]

FINDINGS: Lumbar alignment within normal limits. Mild disc space narrowing at
L5-S1. Minimal degenerative change at T12-L1 and L1-L2. Vertebral
body heights are maintained. Surgical clips in the right upper
quadrant. IUD in the pelvis. Clips in the right pelvis.
IMPRESSION: Mild degenerative changes.  No acute osseous abnormality

## 2021-06-22 ENCOUNTER — Other Ambulatory Visit: Payer: Self-pay | Admitting: Internal Medicine

## 2021-06-22 DIAGNOSIS — F418 Other specified anxiety disorders: Secondary | ICD-10-CM

## 2021-07-15 LAB — HM MAMMOGRAPHY: HM Mammogram: NORMAL (ref 0–4)

## 2021-07-15 LAB — HM PAP SMEAR

## 2021-07-26 ENCOUNTER — Other Ambulatory Visit: Payer: Self-pay | Admitting: Internal Medicine

## 2021-07-26 DIAGNOSIS — F418 Other specified anxiety disorders: Secondary | ICD-10-CM

## 2021-08-01 ENCOUNTER — Other Ambulatory Visit: Payer: Self-pay | Admitting: Internal Medicine

## 2021-08-01 DIAGNOSIS — M7061 Trochanteric bursitis, right hip: Secondary | ICD-10-CM

## 2021-08-01 DIAGNOSIS — F418 Other specified anxiety disorders: Secondary | ICD-10-CM

## 2021-08-01 DIAGNOSIS — G8929 Other chronic pain: Secondary | ICD-10-CM

## 2021-08-01 DIAGNOSIS — M545 Low back pain, unspecified: Secondary | ICD-10-CM

## 2021-08-26 ENCOUNTER — Encounter: Payer: Commercial Managed Care - PPO | Admitting: Internal Medicine

## 2021-10-06 ENCOUNTER — Other Ambulatory Visit: Payer: Self-pay

## 2021-10-06 ENCOUNTER — Encounter: Payer: Self-pay | Admitting: Internal Medicine

## 2021-10-06 ENCOUNTER — Ambulatory Visit (INDEPENDENT_AMBULATORY_CARE_PROVIDER_SITE_OTHER): Payer: Commercial Managed Care - PPO | Admitting: Internal Medicine

## 2021-10-06 VITALS — BP 128/86 | HR 79 | Temp 97.9°F | Ht 64.0 in | Wt 216.0 lb

## 2021-10-06 DIAGNOSIS — Z0000 Encounter for general adult medical examination without abnormal findings: Secondary | ICD-10-CM | POA: Diagnosis not present

## 2021-10-06 DIAGNOSIS — M7061 Trochanteric bursitis, right hip: Secondary | ICD-10-CM | POA: Diagnosis not present

## 2021-10-06 DIAGNOSIS — G8929 Other chronic pain: Secondary | ICD-10-CM

## 2021-10-06 DIAGNOSIS — I10 Essential (primary) hypertension: Secondary | ICD-10-CM

## 2021-10-06 DIAGNOSIS — F418 Other specified anxiety disorders: Secondary | ICD-10-CM

## 2021-10-06 DIAGNOSIS — M7062 Trochanteric bursitis, left hip: Secondary | ICD-10-CM

## 2021-10-06 DIAGNOSIS — M545 Low back pain, unspecified: Secondary | ICD-10-CM

## 2021-10-06 LAB — BASIC METABOLIC PANEL
BUN: 10 mg/dL (ref 6–23)
CO2: 32 mEq/L (ref 19–32)
Calcium: 9.3 mg/dL (ref 8.4–10.5)
Chloride: 105 mEq/L (ref 96–112)
Creatinine, Ser: 0.54 mg/dL (ref 0.40–1.20)
GFR: 110.88 mL/min (ref >60.00)
Glucose, Bld: 92 mg/dL (ref 70–99)
Potassium: 3.8 mEq/L (ref 3.5–5.1)
Sodium: 142 mEq/L (ref 135–145)

## 2021-10-06 LAB — CBC WITH DIFFERENTIAL/PLATELET
Basophils Absolute: 0.1 10*3/uL (ref 0.0–0.1)
Basophils Relative: 0.8 % (ref 0.0–3.0)
Eosinophils Absolute: 0.2 10*3/uL (ref 0.0–0.7)
Eosinophils Relative: 3.1 % (ref 0.0–5.0)
HCT: 41.4 % (ref 36.0–46.0)
Hemoglobin: 13.6 g/dL (ref 12.0–15.0)
Lymphocytes Relative: 28.5 % (ref 12.0–46.0)
Lymphs Abs: 2 10*3/uL (ref 0.7–4.0)
MCHC: 33 g/dL (ref 30.0–36.0)
MCV: 87.9 fl (ref 78.0–100.0)
Monocytes Absolute: 0.7 10*3/uL (ref 0.1–1.0)
Monocytes Relative: 9.9 % (ref 3.0–12.0)
Neutro Abs: 4 10*3/uL (ref 1.4–7.7)
Neutrophils Relative %: 57.7 % (ref 43.0–77.0)
Platelets: 252 10*3/uL (ref 150.0–400.0)
RBC: 4.71 Mil/uL (ref 3.87–5.11)
RDW: 13.6 % (ref 11.5–15.5)
WBC: 6.9 10*3/uL (ref 4.0–10.5)

## 2021-10-06 LAB — LIPID PANEL
Cholesterol: 218 mg/dL — ABNORMAL HIGH (ref 0–200)
HDL: 41.1 mg/dL (ref >39.00)
LDL Cholesterol: 153 mg/dL — ABNORMAL HIGH (ref 0–99)
NonHDL: 176.81
Total CHOL/HDL Ratio: 5
Triglycerides: 120 mg/dL (ref 0.0–149.0)
VLDL: 24 mg/dL (ref 0.0–40.0)

## 2021-10-06 LAB — TSH: TSH: 1.54 u[IU]/mL (ref 0.35–5.50)

## 2021-10-06 MED ORDER — BUSPIRONE HCL 10 MG PO TABS: 10.0000 mg | ORAL_TABLET | Freq: Two times a day (BID) | ORAL | 1 refills | Status: DC

## 2021-10-06 MED ORDER — DULOXETINE HCL 60 MG PO CPEP: ORAL_CAPSULE | ORAL | 1 refills | Status: DC

## 2021-10-06 NOTE — Progress Notes (Signed)
Subjective:  Patient ID: Danielle Cook, female    DOB: 19-May-1976  Age: 45 y.o. MRN: [id number]  CC: Annual Exam, Hypertension, and Depression  This visit occurred during the SARS-CoV-2 public health emergency.  Safety protocols were in place, including screening questions prior to the visit, additional usage of staff PPE, and extensive cleaning of exam room while observing appropriate contact time as indicated for disinfecting solutions.    HPI Danielle Cook presents for a PX and f/up -   Her BP has been well controlled. She denies HA, BV, CP, SOB, DOE, edema, fatigue.  She has lost weight with lifestyle modifications.  Outpatient Medications Prior to Visit  Medication Sig Dispense Refill   augmented betamethasone dipropionate (DIPROLENE-AF) 0.05 % cream Apply 1 application topically 2 (two) times daily. 30 g 1   GARLIC PO Take 1 tablet by mouth daily at 12 noon. 100 mg/day     levonorgestrel (MIRENA) 20 MCG/24HR IUD 1 Intra Uterine Device (1 each total) by Intrauterine route once. 1 each 0   Lifitegrast (XIIDRA) 5 % SOLN Place 1 drop into both eyes in the morning and at bedtime.     meloxicam (MOBIC) 15 MG tablet Take 1 tablet (15 mg total) by mouth daily. 90 tablet 1   Misc Natural Products (ELDERBERRY ZINC/VIT C/IMMUNE MT) Take 1 tablet by mouth daily.     Omega-3 Fatty Acids (FISH OIL PO) Take 1,500 mg by mouth daily.     busPIRone (BUSPAR) 10 MG tablet TAKE 1 TABLET BY MOUTH TWICE A DAY 180 tablet 1   DULoxetine (CYMBALTA) 60 MG capsule TAKE 1 CAPSULE BY MOUTH EVERY DAY 90 capsule 2   No facility-administered medications prior to visit.    ROS Review of Systems  Constitutional:  Negative for chills, fatigue and fever.  HENT: Negative.    Eyes: Negative.   Respiratory: Negative.  Negative for cough, shortness of breath and wheezing.   Cardiovascular:  Negative for chest pain, palpitations and leg swelling.  Gastrointestinal:  Negative for abdominal pain, diarrhea,  nausea and vomiting.  Endocrine: Negative.   Genitourinary: Negative.  Negative for difficulty urinating.  Musculoskeletal:  Positive for arthralgias and back pain. Negative for joint swelling and myalgias.  Skin: Negative.  Negative for color change and pallor.  Neurological:  Negative for dizziness, weakness, light-headedness and headaches.  Hematological:  Negative for adenopathy. Does not bruise/bleed easily.  Psychiatric/Behavioral: Negative.  Negative for decreased concentration, dysphoric mood and sleep disturbance. The patient is not nervous/anxious.    Objective:  BP 128/86 (BP Location: Left Arm, Patient Position: Sitting, Cuff Size: Large)   Pulse 79   Temp 97.9 F (36.6 C) (Oral)   Ht 5\' 4"  (1.626 m)   Wt 216 lb (98 kg)   SpO2 98%   BMI 37.08 kg/m   BP Readings from Last 3 Encounters:  10/06/21 128/86  03/04/21 (!) 150/88  02/03/21 126/84    Wt Readings from Last 3 Encounters:  10/06/21 216 lb (98 kg)  03/04/21 222 lb (100.7 kg)  02/03/21 225 lb (102.1 kg)    Physical Exam Vitals reviewed.  Constitutional:      Appearance: Normal appearance.  HENT:     Mouth/Throat:     Mouth: Mucous membranes are moist.  Eyes:     General: No scleral icterus.    Conjunctiva/sclera: Conjunctivae normal.  Cardiovascular:     Rate and Rhythm: Normal rate and regular rhythm.     Heart sounds: No murmur heard.  Pulmonary:     Effort: Pulmonary effort is normal.     Breath sounds: No stridor. No wheezing, rhonchi or rales.  Abdominal:     General: Abdomen is flat.     Palpations: There is no mass.     Tenderness: There is no abdominal tenderness. There is no guarding.     Hernia: No hernia is present.  Musculoskeletal:        General: Normal range of motion.     Cervical back: Neck supple.     Right lower leg: No edema.     Left lower leg: No edema.  Lymphadenopathy:     Cervical: No cervical adenopathy.  Skin:    General: Skin is warm and dry.  Neurological:      General: No focal deficit present.     Mental Status: She is alert.  Psychiatric:        Mood and Affect: Mood normal.        Behavior: Behavior normal.    Lab Results  Component Value Date   WBC 6.9 10/06/2021   HGB 13.6 10/06/2021   HCT 41.4 10/06/2021   PLT 252.0 10/06/2021   GLUCOSE 92 10/06/2021   CHOL 218 (H) 10/06/2021   TRIG 120.0 10/06/2021   HDL 41.10 10/06/2021   LDLCALC 153 (H) 10/06/2021   ALT 16 08/13/2019   AST 16 08/13/2019   NA 142 10/06/2021   K 3.8 10/06/2021   CL 105 10/06/2021   CREATININE 0.54 10/06/2021   BUN 10 10/06/2021   CO2 32 10/06/2021   TSH 1.54 10/06/2021   MICROALBUR <0.7 08/24/2020    No results found.  Assessment & Plan:   Kamylle was seen today for annual exam, hypertension and depression.  Diagnoses and all orders for this visit:  Primary hypertension- Her BP is well controlled with lifestyle modifications. -     CBC with Differential/Platelet; Future -     Basic metabolic panel; Future -     TSH; Future -     Urinalysis, Routine w reflex microscopic; Future -     Urinalysis, Routine w reflex microscopic -     TSH -     Basic metabolic panel -     CBC with Differential/Platelet  Routine general medical examination at a health care facility- Exam completed, labs reviewed, vaccines are up-to-date, cancer screenings are up-to-date, patient education material was given. -     Lipid panel; Future -     Lipid panel  Depression with anxiety- She is doing well on the current regimen. -     busPIRone (BUSPAR) 10 MG tablet; Take 1 tablet (10 mg total) by mouth 2 (two) times daily. -     DULoxetine (CYMBALTA) 60 MG capsule; TAKE 1 CAPSULE BY MOUTH EVERY DAY Strength: 60 mg -     TSH; Future -     TSH  Greater trochanteric bursitis of both hips -     DULoxetine (CYMBALTA) 60 MG capsule; TAKE 1 CAPSULE BY MOUTH EVERY DAY Strength: 60 mg  Chronic bilateral low back pain without sciatica -     DULoxetine (CYMBALTA) 60 MG capsule; TAKE  1 CAPSULE BY MOUTH EVERY DAY Strength: 60 mg  I have changed Anette Riedel. Ackers's busPIRone and DULoxetine. I am also having her maintain her levonorgestrel, Misc Natural Products (ELDERBERRY ZINC/VIT C/IMMUNE MT), Xiidra, GARLIC PO, Omega-3 Fatty Acids (FISH OIL PO), augmented betamethasone dipropionate, and meloxicam.  Meds ordered this encounter  Medications   busPIRone (BUSPAR)  10 MG tablet    Sig: Take 1 tablet (10 mg total) by mouth 2 (two) times daily.    Dispense:  180 tablet    Refill:  1   DULoxetine (CYMBALTA) 60 MG capsule    Sig: TAKE 1 CAPSULE BY MOUTH EVERY DAY Strength: 60 mg    Dispense:  90 capsule    Refill:  1     Follow-up: Return in about 6 months (around 04/06/2022).  Sanda Linger, MD

## 2021-10-06 NOTE — Patient Instructions (Signed)

## 2021-10-07 LAB — URINALYSIS, ROUTINE W REFLEX MICROSCOPIC
Bilirubin Urine: NEGATIVE
Hgb urine dipstick: NEGATIVE
Ketones, ur: NEGATIVE
Nitrite: NEGATIVE
Specific Gravity, Urine: 1.025 (ref 1.000–1.030)
Total Protein, Urine: NEGATIVE
Urine Glucose: NEGATIVE
Urobilinogen, UA: 0.2 (ref 0.0–1.0)
pH: 6 (ref 5.0–8.0)

## 2022-01-10 ENCOUNTER — Encounter: Payer: Self-pay | Admitting: Family Medicine

## 2022-01-11 ENCOUNTER — Other Ambulatory Visit: Payer: Self-pay

## 2022-01-11 DIAGNOSIS — M545 Low back pain, unspecified: Secondary | ICD-10-CM

## 2022-01-11 DIAGNOSIS — M7061 Trochanteric bursitis, right hip: Secondary | ICD-10-CM

## 2022-01-11 MED ORDER — MELOXICAM 15 MG PO TABS: 15.0000 mg | ORAL_TABLET | Freq: Every day | ORAL | 1 refills | Status: DC

## 2022-02-17 ENCOUNTER — Other Ambulatory Visit: Payer: Self-pay | Admitting: Internal Medicine

## 2022-02-17 DIAGNOSIS — M7061 Trochanteric bursitis, right hip: Secondary | ICD-10-CM

## 2022-02-17 DIAGNOSIS — F418 Other specified anxiety disorders: Secondary | ICD-10-CM

## 2022-02-17 DIAGNOSIS — G8929 Other chronic pain: Secondary | ICD-10-CM

## 2022-02-18 ENCOUNTER — Ambulatory Visit (INDEPENDENT_AMBULATORY_CARE_PROVIDER_SITE_OTHER): Payer: Commercial Managed Care - PPO

## 2022-02-18 ENCOUNTER — Ambulatory Visit: Payer: Commercial Managed Care - PPO | Admitting: Surgical

## 2022-02-18 DIAGNOSIS — G8929 Other chronic pain: Secondary | ICD-10-CM | POA: Diagnosis not present

## 2022-02-18 DIAGNOSIS — M1712 Unilateral primary osteoarthritis, left knee: Secondary | ICD-10-CM | POA: Diagnosis not present

## 2022-02-18 DIAGNOSIS — M25562 Pain in left knee: Secondary | ICD-10-CM

## 2022-02-20 ENCOUNTER — Encounter: Payer: Self-pay | Admitting: Orthopedic Surgery

## 2022-02-20 NOTE — Progress Notes (Signed)
? ?Office Visit Note ?  ?Patient: Danielle Cook           ?Date of Birth: Jul 14, 1976           ?MRN: [id number] ?Visit Date: 02/18/2022 ?Requested by: [name], MD ?[place],  [place] [address] ?PCP: [name], MD ? ?Subjective: ?Chief Complaint  ?Patient presents with  ?? Left Knee - Pain  ? ? ?HPI: Danielle Cook is a 46 y.o. female who presents to the office complaining of left knee pain.  Patient states that she has had primarily medial sided left knee pain over the last several months that she has noticed more and more.  No recent injury that she can point to as the start of her symptoms.  She does have history of left knee anterior cruciate ligament reconstruction with medial meniscal debridement in 2021.  She denies any instability symptoms but does note popping with pain for several months.  No radicular pain.  She has had no relief with ibuprofen but has had some relief with Robaxin.  She has to wear a brace at work.  She denies any locking symptoms except for one instance about a month ago where her knee locked for several days in extension.  Aside from that episode, she has had no locking.  No groin pain.Marland Kitchen   ?             ?ROS: All systems reviewed are negative as they relate to the chief complaint within the history of present illness.  Patient denies fevers or chills. ? ?Assessment & Plan: ?Visit Diagnoses:  ?1. Unilateral primary osteoarthritis, left knee   ?2. Chronic pain of left knee   ? ? ?Plan: Patient is a 46 year old female who presents for evaluation of left knee pain.  Has history of ACL reconstruction with quad allograft and partial medial meniscectomy in August 2021.  She did well following that procedure until the last several months when she has had increased pain in the knee.  She has had 1 episode of locking where she locked for several days about a month ago but no locking since then.  Most of her pain is in the medial aspect of the knee.  She has  radiographs today demonstrating decreased joint space in the medial compartment compared with prior radiographs from about 2 years ago.  Impression is progression of grade II-III chondromalacia that was present at the time of her procedure as noted in the operative note.  ACL graft feels stable on exam.   ? ?Plan is administration of cortisone injection today and patient tolerated the procedure well.  Follow-up in 6 weeks for clinical recheck with Dr. Marlou Sa primarily to assess how much relief she got from the injection and if she has had any recurrence of the locking episode that she had previously.  There is no loose body that is noted on radiographs today.  Follow-up in 6 weeks. ? ?Follow-Up Instructions: No follow-ups on file.  ? ?Orders:  ?Orders Placed This Encounter  ?Procedures  ?? XR Knee Complete 4 Views Left  ? ?No orders of the defined types were placed in this encounter. ? ? ? ? Procedures: ?Large Joint Inj: L knee on 02/18/2022 1:33 PM ?Indications: diagnostic evaluation, joint swelling and pain ?Details: 18 G 1.5 in needle, superolateral approach ? ?Arthrogram: No ? ?Medications: 5 mL lidocaine 1 %; 40 mg methylPREDNISolone acetate 40 MG/ML; 4 mL bupivacaine 0.25 % ?Outcome: tolerated well, no immediate  complications ?Procedure, treatment alternatives, risks and benefits explained, specific risks discussed. Consent was given by the patient. Immediately prior to procedure a time out was called to verify the correct patient, procedure, equipment, support staff and site/side marked as required. Patient was prepped and draped in the usual sterile fashion.  ? ? ? ? ?Clinical Data: ?No additional findings. ? ?Objective: ?Vital Signs: There were no vitals taken for this visit. ? ?Physical Exam:  ?Constitutional: Patient appears well-developed ?HEENT:  ?Head: Normocephalic ?Eyes:EOM are normal ?Neck: Normal range of motion ?Cardiovascular: Normal rate ?Pulmonary/chest: Effort normal ?Neurologic: Patient is  alert ?Skin: Skin is warm ?Psychiatric: Patient has normal mood and affect ? ?Ortho Exam: Ortho exam demonstrates left knee with no significant effusion.  No tenderness over the lateral joint line.  Moderate tenderness over the medial joint line.  Range of motion from 0 degrees extension to 120 degrees of knee flexion.  Incisions are well-healed without any evidence of infection.  Anterior cruciate ligament graft is stable on Lachman exam and by anterior drawer.  Able to posterior drawer.  Stable to varus/valgus stress at 0 and 30 degrees.  No calf tenderness.  Negative Homans' sign.  No pain with hip range of motion.  Negative Stinchfield sign.  There is a clicking sensation that is noted over the anterior lateral knee with the knee being brought from flexion to extension at around 70 degrees of knee flexion. ? ?Specialty Comments:  ?No specialty comments available. ? ?Imaging: ?No results found. ? ? ?PMFS History: ?Patient Active Problem List  ? Diagnosis Date Noted  ?? Moderate episode of recurrent major depressive disorder (HCC) 01/06/2021  ?? Greater trochanteric bursitis of both hips 01/06/2021  ?? Low back pain 01/06/2021  ?? Primary hypertension 08/24/2020  ?? Right carpal tunnel syndrome 12/06/2019  ?? Xerosis cutis 10/16/2019  ?? Nicotine vapor product user 08/09/2018  ?? Visit for screening mammogram 12/13/2016  ?? Family history of premature CAD 12/13/2016  ?? Radiculitis of left cervical region 03/23/2016  ?? 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 Routine general medical examination at a health care facility 03/22/2013  ?? Allergic rhinitis, cause unspecified 03/22/2013  ?? Depression with anxiety 10/31/2011  ? ?Past Medical History:  ?Diagnosis Date  ?? Abnormal Pap smear   ?? Allergy   ?? Anxiety   ?? Complication of anesthesia   ?? Depression   ?? History of chicken pox   ?? Hx of TB skin testing   ?? Hyperlipidemia   ? diet controlled - no meds  ?? Migraine   ?? PONV (postoperative nausea and  vomiting)   ?? PPD positive, treated 2004  ? currently no symptoms, chest xrays negative per pt 09/30/20  ?  ?Family History  ?Problem Relation Age of Onset  ?? Stroke Mother   ?? Dementia Mother   ?? Hyperlipidemia Mother   ?? Early death Father   ?? Heart disease Father   ?? Hyperlipidemia Father   ?? Cancer Neg Hx   ?? Depression Neg Hx   ?? Diabetes Neg Hx   ?? Drug abuse Neg Hx   ?? Hearing loss Neg Hx   ?? Hypertension Neg Hx   ?  ?Past Surgical History:  ?Procedure Laterality Date  ?? ANTERIOR CRUCIATE LIGAMENT REPAIR Left 06/11/2020  ? Procedure: LEFT KNEE ANTERIOR CRUCIATE LIGAMENT (ACL) RECONSTRUCTION WITH WITH QUAD ALLOGRAFT, MENISCAL REPAIR vs DEBRIDEMENT;  Surgeon: Cammy Copa, MD;  Location: [facility] OR;  Service: Orthopedics;  Laterality: Left;  ?? CARPAL TUNNEL RELEASE  Right 10/01/2020  ? Procedure: RIGHT CARPAL TUNNEL RELEASE;  Surgeon: Meredith Pel, MD;  Location: [facility];  Service: Orthopedics;  Laterality: Right;  ?? CHOLECYSTECTOMY, LAPAROSCOPIC  07/13/2000  ?? COLPOSCOPY  2000,   ?? EYE SURGERY Bilateral 07/2017  ? Lasik  ?? TONSILLECTOMY    ?? [facility]  ? ?Social History  ? ?Occupational History  ?? Not on file  ?Tobacco Use  ?? Smoking status: Every Day  ?  Packs/day: 0.25  ?  Years: 15.00  ?  Pack years: 3.75  ?  Types: E-cigarettes, Cigarettes  ?? Smokeless tobacco: Never  ?Vaping Use  ?? Vaping Use: Never used  ?Substance and Sexual Activity  ?? Alcohol use: Not Currently  ?  Comment: occasional  ?? Drug use: No  ?? Sexual activity: Yes  ?  Partners: Male  ?  Birth control/protection: I.U.D.  ?  Comment: Mirena IUD  ? ? ? ? ?  ?

## 2022-02-21 MED ORDER — METHYLPREDNISOLONE ACETATE 40 MG/ML IJ SUSP
40.0000 mg | INTRAMUSCULAR | Status: AC | PRN
  Administered 2022-02-18: 40 mg via INTRA_ARTICULAR

## 2022-02-21 MED ORDER — BUPIVACAINE HCL 0.25 % IJ SOLN
4.0000 mL | INTRAMUSCULAR | Status: AC | PRN
  Administered 2022-02-18: 4 mL via INTRA_ARTICULAR

## 2022-02-21 MED ORDER — LIDOCAINE HCL 1 % IJ SOLN
5.0000 mL | INTRAMUSCULAR | Status: AC | PRN
  Administered 2022-02-18: 5 mL

## 2022-04-01 ENCOUNTER — Ambulatory Visit: Payer: Commercial Managed Care - PPO | Admitting: Surgical

## 2022-04-01 ENCOUNTER — Telehealth: Payer: Self-pay

## 2022-04-01 ENCOUNTER — Encounter: Payer: Self-pay | Admitting: Surgical

## 2022-04-01 DIAGNOSIS — M1712 Unilateral primary osteoarthritis, left knee: Secondary | ICD-10-CM | POA: Diagnosis not present

## 2022-04-01 DIAGNOSIS — Z9889 Other specified postprocedural states: Secondary | ICD-10-CM | POA: Diagnosis not present

## 2022-04-01 MED ORDER — LIDOCAINE HCL 1 % IJ SOLN
5.0000 mL | INTRAMUSCULAR | Status: AC | PRN
  Administered 2022-04-01: 5 mL

## 2022-04-01 MED ORDER — BUPIVACAINE HCL 0.25 % IJ SOLN
4.0000 mL | INTRAMUSCULAR | Status: AC | PRN
  Administered 2022-04-01: 4 mL via INTRA_ARTICULAR

## 2022-04-01 NOTE — Telephone Encounter (Signed)
Can this patient get pre approved for L knee Gel injections ?

## 2022-04-01 NOTE — Telephone Encounter (Signed)
Noted  

## 2022-04-01 NOTE — Progress Notes (Signed)
Office Visit Note   Patient: Danielle Cook           Date of Birth: January 05, 1976           MRN: [id number] Visit Date: 04/01/2022 Requested by: Etta Grandchild, MD [address] [place],  [place] [address] PCP: Etta Grandchild, MD  Subjective: Chief Complaint  Patient presents with   Right Knee - Pain    HPI: Danielle Cook is a 46 y.o. female who presents to the office complaining of left knee pain.  Patient returns for reevaluation of left knee pain.  She had cortisone injection at last appointment that gave her good relief for about 4 weeks.  Pain has started to return.  Overall pain is tolerable, especially with meloxicam that she takes for work shifts and gives her 80 to 90% relief of her pain.  Localizes the majority of her pain to the medial joint line.  She has history of left knee ACL reconstruction with medial meniscal debridement.  No instability.  No further locking symptoms as few described at the last appointment.  She does have some popping sensation in the medial side of the knee..                ROS: All systems reviewed are negative as they relate to the chief complaint within the history of present illness.  Patient denies fevers or chills.  Assessment & Plan: Visit Diagnoses:  1. Unilateral primary osteoarthritis, left knee   2. S/P left knee arthroscopy     Plan: Patient is a 46 year old female who presents for evaluation of left knee pain.  She has history of left knee ACL reconstruction and medial meniscal debridement.  At the time of surgery in 2021, operative note described grade II-III chondromalacia in the medial femoral condyle along with tear of approximately 50% of the meniscal volume of the medial meniscus.  This was debrided.  She had cortisone injection at her last appointment about 6 weeks ago that provided about 4 weeks of relief.  She gets excellent relief from meloxicam.  No instability and ACL graft is stable on exam today.  She does have  popping sensation in the medial joint line.  Plan is administration of left knee Toradol injection today and preapproved her for left knee gel injection for treatment of arthritis.  She is keeping up with a home exercise program comprising of leg lifts, calf raises, knee range of motion exercises.  She works 12-hour shifts overnight that requires a lot of walking.  Follow-up in 4 weeks for clinical recheck with Dr. August Saucer for his evaluation of her medial knee pain/popping and administration of left knee gel injection at that time.  This patient is diagnosed with osteoarthritis of the knee(s).    Radiographs show evidence of joint space narrowing, osteophytes, subchondral sclerosis and/or subchondral cysts.  This patient has knee pain which interferes with functional and activities of daily living.    This patient has experienced inadequate response, adverse effects and/or intolerance with conservative treatments such as acetaminophen, NSAIDS, topical creams, physical therapy or regular exercise, knee bracing and/or weight loss.   This patient has experienced inadequate response or has a contraindication to intra articular steroid injections for at least 3 months.   This patient is not scheduled to have a total knee replacement within 6 months of starting treatment with viscosupplementation.   Follow-Up Instructions: No follow-ups on file.   Orders:  No orders of the defined types  were placed in this encounter.  No orders of the defined types were placed in this encounter.     Procedures: Large Joint Inj: L knee on 04/01/2022 9:22 AM Indications: diagnostic evaluation, joint swelling and pain Details: 18 G 1.5 in needle, superolateral approach  Arthrogram: No  Medications: 5 mL lidocaine 1 %; 4 mL bupivacaine 0.25 % (Bupivacaine mixed with 1cc toradol ) Outcome: tolerated well, no immediate complications Procedure, treatment alternatives, risks and benefits explained, specific risks  discussed. Consent was given by the patient. Immediately prior to procedure a time out was called to verify the correct patient, procedure, equipment, support staff and site/side marked as required. Patient was prepped and draped in the usual sterile fashion.      Clinical Data: No additional findings.  Objective: Vital Signs: There were no vitals taken for this visit.  Physical Exam:  Constitutional: Patient appears well-developed HEENT:  Head: Normocephalic Eyes:EOM are normal Neck: Normal range of motion Cardiovascular: Normal rate Pulmonary/chest: Effort normal Neurologic: Patient is alert Skin: Skin is warm Psychiatric: Patient has normal mood and affect  Ortho Exam: Ortho exam demonstrates left knee with trace effusion.  Moderate medial joint line tenderness.  No significant lateral joint line tenderness.  0 degrees extension and 115 degrees of knee flexion.  No calf tenderness.  Negative Homans' sign.  Patient is able to perform straight leg raise.  Stable to Lachman exam with positive endpoint.  Stable to anterior drawer with positive endpoint.  Incisions are well-healed from prior ACL surgery.  Minimal tenderness over the pes anserine bursa.  Specialty Comments:  No specialty comments available.  Imaging: No results found.   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   Routine general medical examination at a health care facility 03/22/2013   Allergic rhinitis, cause  unspecified 03/22/2013   Depression with anxiety 10/31/2011   Past Medical History:  Diagnosis Date   Abnormal Pap smear    Allergy    Anxiety    Complication of anesthesia    Depression    History of chicken pox    Hx of TB skin testing    Hyperlipidemia    diet controlled - no meds   Migraine    PONV (postoperative nausea and vomiting)    PPD positive, treated 2004   currently no symptoms, chest xrays negative per pt 09/30/20    Family History  Problem Relation Age of Onset   Stroke Mother    Dementia Mother    Hyperlipidemia Mother    Early death Father    Heart disease Father    Hyperlipidemia Father    Cancer Neg Hx    Depression Neg Hx    Diabetes Neg Hx    Drug abuse Neg Hx    Hearing loss Neg Hx    Hypertension Neg Hx     Past Surgical History:  Procedure Laterality Date   ANTERIOR CRUCIATE LIGAMENT REPAIR Left 06/11/2020   Procedure: LEFT KNEE ANTERIOR CRUCIATE LIGAMENT (ACL) RECONSTRUCTION WITH WITH QUAD ALLOGRAFT,  MENISCAL REPAIR vs DEBRIDEMENT;  Surgeon: Cammy Copa, MD;  Location: [facility] OR;  Service: Orthopedics;  Laterality: Left;   CARPAL TUNNEL RELEASE Right 10/01/2020   Procedure: RIGHT CARPAL TUNNEL RELEASE;  Surgeon: Cammy Copa, MD;  Location: [facility] OR;  Service: Orthopedics;  Laterality: Right;   CHOLECYSTECTOMY, LAPAROSCOPIC  07/13/2000   COLPOSCOPY  2000,    EYE SURGERY Bilateral 07/2017   Lasik   TONSILLECTOMY     WISDOM TOOTH EXTRACTION  1995 Jan   Social History   Occupational History   Not on file  Tobacco Use   Smoking status: Every Day    Packs/day: 0.25    Years: 15.00    Pack years: 3.75    Types: E-cigarettes, Cigarettes   Smokeless tobacco: Never  Vaping Use   Vaping Use: Never used  Substance and Sexual Activity   Alcohol use: Not Currently    Comment: occasional   Drug use: No   Sexual activity: Yes    Partners: Male    Birth control/protection: I.U.D.    Comment: Mirena IUD

## 2022-04-29 ENCOUNTER — Ambulatory Visit: Payer: Commercial Managed Care - PPO | Admitting: Orthopedic Surgery

## 2022-04-29 DIAGNOSIS — M1712 Unilateral primary osteoarthritis, left knee: Secondary | ICD-10-CM

## 2022-05-04 ENCOUNTER — Other Ambulatory Visit: Payer: Self-pay

## 2022-05-04 ENCOUNTER — Encounter: Payer: Self-pay | Admitting: Orthopedic Surgery

## 2022-05-04 DIAGNOSIS — M1712 Unilateral primary osteoarthritis, left knee: Secondary | ICD-10-CM

## 2022-05-04 MED ORDER — LIDOCAINE HCL 1 % IJ SOLN
5.0000 mL | INTRAMUSCULAR | Status: AC | PRN
  Administered 2022-04-29: 5 mL

## 2022-05-04 MED ORDER — SODIUM HYALURONATE 60 MG/3ML IX PRSY
60.0000 mg | PREFILLED_SYRINGE | INTRA_ARTICULAR | Status: AC | PRN
  Administered 2022-04-29: 60 mg via INTRA_ARTICULAR

## 2022-05-04 NOTE — Progress Notes (Signed)
Office Visit Note   Patient: Danielle Cook           Date of Birth: [birth year]           MRN: [id number] Visit Date: 04/29/2022 Requested by: Etta Grandchild, MD [address] [place],  [place] [address] PCP: Etta Grandchild, MD  Subjective: Chief Complaint  Patient presents with   Left Knee - Follow-up    HPI: Danielle Cook is a 46 year old patient with left knee pain.  Had knee injection 6-23 with Danielle Cook which helped some.  This was a cortisone shot.  Does have some anteromedial knee pain consistent with early arthritis with her known meniscal issues.  She has done well from a stability standpoint in the knee.  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  She uses a sleeve at times.              ROS: All systems reviewed are negative as they relate to the chief complaint within the history of present illness.  Patient denies  fevers or chills.   Assessment & Plan: Visit Diagnoses:  1. Unilateral primary osteoarthritis, left knee     Plan: Impression is left knee pain with good stability and no effusion today.  Gel injection performed today to see if we can help with some of her standing pain.  Knee is otherwise stable.  Not too much effusion today.  Could consider repeat cortisone injection in approximately 4 months if needed.  Follow-Up Instructions: Return if symptoms worsen or fail to improve.   Orders:  No orders of the defined types were placed in this encounter.  No orders of the defined types were placed in this encounter.     Procedures: Large Joint Inj: L knee on 04/29/2022 7:16 AM Indications: diagnostic evaluation, joint swelling and pain Details: 18 G 1.5 in needle, superolateral approach  Arthrogram: No  Medications: 5 mL lidocaine 1 %; 60 mg Sodium Hyaluronate 60 MG/3ML Outcome: tolerated well, no immediate complications Procedure, treatment alternatives, risks and benefits explained, specific risks discussed. Consent was given by the  patient. Immediately prior to procedure a time out was called to verify the correct patient, procedure, equipment, support staff and site/side marked as required. Patient was prepped and draped in the usual sterile fashion.       Clinical Data: No additional findings.  Objective: Vital Signs: There were no vitals taken for this visit.  Physical Exam:   Constitutional: Patient appears well-developed HEENT:  Head: Normocephalic Eyes:EOM are normal Neck: Normal range of motion Cardiovascular: Normal rate Pulmonary/chest: Effort normal Neurologic: Patient is alert Skin: Skin is warm Psychiatric: Patient has normal mood and affect   Ortho Exam: Ortho exam demonstrates full active and passive range of motion of the left knee.  Has medial greater than lateral joint line tenderness with good stability to varus and valgus stress at 0 30 and 90 degrees.  ACL stable with about 1 to 1/2 mm Lachman and anterior drawer left side compared to the right.  Specialty Comments:  No specialty comments available.  Imaging: No results found.   PMFS History: Patient Active Problem List   Diagnosis Date Noted   Moderate episode of recurrent major depressive disorder (HCC) 01/06/2021   Greater trochanteric bursitis of both hips 01/06/2021   Low back pain 01/06/2021   Primary hypertension 08/24/2020   Right carpal tunnel syndrome 12/06/2019   Xerosis cutis 10/16/2019   Nicotine vapor product user 08/09/2018  Visit for screening mammogram 12/13/2016   Family history of premature CAD 12/13/2016   Radiculitis of left cervical region 03/23/2016   PPD positive, treated 03/22/2013   Routine general medical examination at a health care facility 03/22/2013   Allergic rhinitis, cause unspecified 03/22/2013   Depression with anxiety 10/31/2011   Past Medical History:  Diagnosis Date   Abnormal Pap smear    Allergy    Anxiety    Complication of anesthesia    Depression    History of chicken  pox    Hx of TB skin testing    Hyperlipidemia    diet controlled - no meds   Migraine    PONV (postoperative nausea and vomiting)    PPD positive, treated 2004   currently no symptoms, chest xrays negative per pt 09/30/20    Family History  Problem Relation Age of Onset   Stroke Mother    Dementia Mother    Hyperlipidemia Mother    Early death Father    Heart disease Father    Hyperlipidemia Father    Cancer Neg Hx    Depression Neg Hx    Diabetes Neg Hx    Drug abuse Neg Hx    Hearing loss Neg Hx    Hypertension Neg Hx     Past Surgical History:  Procedure Laterality Date   ANTERIOR CRUCIATE LIGAMENT REPAIR Left 06/11/2020   Procedure: LEFT KNEE ANTERIOR CRUCIATE LIGAMENT (ACL) RECONSTRUCTION WITH WITH QUAD ALLOGRAFT, MENISCAL REPAIR vs DEBRIDEMENT;  Surgeon: Cammy Copa, MD;  Location: [facility] OR;  Service: Orthopedics;  Laterality: Left;   CARPAL TUNNEL RELEASE Right 10/01/2020   Procedure: RIGHT CARPAL TUNNEL RELEASE;  Surgeon: Cammy Copa, MD;  Location: [facility] OR;  Service: Orthopedics;  Laterality: Right;   CHOLECYSTECTOMY, LAPAROSCOPIC  07/13/2000   COLPOSCOPY  2000,    EYE SURGERY Bilateral 07/2017   Lasik   TONSILLECTOMY     WISDOM TOOTH EXTRACTION  1995 Jan   Social History   Occupational History   Not on file  Tobacco Use   Smoking status: Every Day    Packs/day: 0.25    Years: 15.00    Total pack years: 3.75    Types: E-cigarettes, Cigarettes   Smokeless tobacco: Never  Vaping Use   Vaping Use: Never used  Substance and Sexual Activity   Alcohol use: Not Currently    Comment: occasional   Drug use: No   Sexual activity: Yes    Partners: Male    Birth control/protection: I.U.D.    Comment: Mirena IUD

## 2022-06-27 ENCOUNTER — Other Ambulatory Visit: Payer: Self-pay | Admitting: Family Medicine

## 2022-06-27 DIAGNOSIS — M545 Low back pain, unspecified: Secondary | ICD-10-CM

## 2022-06-27 DIAGNOSIS — M7061 Trochanteric bursitis, right hip: Secondary | ICD-10-CM

## 2022-08-26 ENCOUNTER — Ambulatory Visit: Payer: Commercial Managed Care - PPO | Admitting: Orthopedic Surgery

## 2022-08-26 ENCOUNTER — Ambulatory Visit (INDEPENDENT_AMBULATORY_CARE_PROVIDER_SITE_OTHER): Payer: Commercial Managed Care - PPO

## 2022-08-26 ENCOUNTER — Ambulatory Visit: Payer: Self-pay

## 2022-08-26 ENCOUNTER — Encounter: Payer: Self-pay | Admitting: Orthopedic Surgery

## 2022-08-26 DIAGNOSIS — G8929 Other chronic pain: Secondary | ICD-10-CM

## 2022-08-26 DIAGNOSIS — M1712 Unilateral primary osteoarthritis, left knee: Secondary | ICD-10-CM

## 2022-08-26 DIAGNOSIS — M25561 Pain in right knee: Secondary | ICD-10-CM

## 2022-08-26 MED ORDER — METHYLPREDNISOLONE ACETATE 40 MG/ML IJ SUSP
40.0000 mg | INTRAMUSCULAR | Status: AC | PRN
  Administered 2022-08-26: 40 mg via INTRA_ARTICULAR

## 2022-08-26 MED ORDER — BUPIVACAINE HCL 0.25 % IJ SOLN
4.0000 mL | INTRAMUSCULAR | Status: AC | PRN
  Administered 2022-08-26: 4 mL via INTRA_ARTICULAR

## 2022-08-26 MED ORDER — LIDOCAINE HCL 1 % IJ SOLN
5.0000 mL | INTRAMUSCULAR | Status: AC | PRN
  Administered 2022-08-26: 5 mL

## 2022-08-26 NOTE — Progress Notes (Signed)
Office Visit Note   Patient: Danielle Cook           Date of Birth: 02-10-1976           MRN: [id number] Visit Date: 08/26/2022 Requested by: [name], MD [address] [place],  [place] [address] PCP: [name], MD  Subjective: Chief Complaint  Patient presents with   Left Knee - Pain    HPI: Danielle Cook is a 46 y.o. female who presents to the office reporting left knee pain.  She had gel injection 04/29/2022 without any significant relief.  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  Does have a history of ACL reconstruction about 2 to 3 years ago.  This was done with quadriceps allograft.  Describes throbbing aching pain in the medial aspect of her knee.  Does have a history of low vitamin D but has not been checked in about a year.  Uses lidocaine patch as well as a brace for her knee.  Her op note is reviewed.  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  She was out of work last night due to the pain in her knee and a note is provided for that today..                ROS: All systems reviewed are negative as they relate to the chief complaint within the history of present illness.  Patient denies fevers or chills.  Assessment & Plan: Visit Diagnoses:  1. Unilateral primary osteoarthritis, left knee   2. Chronic pain of right knee     Plan: Impression is left knee pain medial aspect which could be progressive arthritis and or stress reaction/fracture.  Has a history of low vitamin D.  Her weightbearing pain even though it has been relatively chronic seems more intense than I would expect from garden-variety arthritis.  Would like to draw a vitamin D panel today and if it is less than or equal to 30 or 35 supplementation would be indicated.  Injection performed into the knee today as well.  We will see her back as needed.  She will  let us know if she wants to proceed with MRI scanning based on insurance issues.  She does have some laxity in that allograft today but does have an endpoint on Lachman exam.  Follow-Up Instructions: No follow-ups on file.   Orders:  Orders Placed This Encounter  Procedures   XR Knee 1-2 Views Left   Vitamin D (25 hydroxy)   No orders of the defined types were placed in this encounter.     Procedures: Large Joint Inj on 08/26/2022 11:14 PM Indications: diagnostic evaluation, joint swelling and pain Details: 18 G 1.5 in needle, superolateral approach  Arthrogram: No  Medications: 5 mL lidocaine 1 %; 40 mg methylPREDNISolone acetate 40 MG/ML; 4 mL bupivacaine 0.25 % Outcome: tolerated well, no immediate complications Procedure, treatment alternatives, risks and benefits explained, specific risks discussed. Consent was given by the patient. Immediately prior to procedure a time out was called to verify the correct patient, procedure, equipment, support staff and site/side marked as required. Patient was prepped and draped in the usual sterile fashion.       Clinical Data: No additional findings.  Objective: Vital Signs: There were no vitals taken for this visit.  Physical Exam:  Constitutional: Patient appears  well-developed HEENT:  Head: Normocephalic Eyes:EOM are normal Neck: Normal range of motion Cardiovascular: Normal rate Pulmonary/chest: Effort normal Neurologic: Patient is alert Skin: Skin is warm Psychiatric: Patient has normal mood and affect  Ortho Exam: Ortho exam demonstrates no effusion in the left knee today.  Does have medial proximal tibial plateau pain.  Range of motion is full.  Extensor mechanism intact.  Has mild amount of crepitus more on the left than the right but not an unexpected amount based on her prior surgery.  Collateral ligaments are stable.  ACL has about 3 mm more laxity to anterior drawer and Lachman testing on the left compared to the  right.  Endpoint is present.  No posterolateral rotatory instability is present.  Patient is not really describing any instability symptoms but more pain in that medial proximal tibial plateau region.  Specialty Comments:  No specialty comments available.  Imaging: XR Knee 1-2 Views Left  Result Date: 08/26/2022 AP lateral radiographs left knee reviewed.  Medial joint space narrowing is present compared to prior radiographs as well as compared to the right knee.  No acute fracture.  No bone tunnel widening.  Lateral compartment and patellofemoral compartments appear intact.  Alignment intact.    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   Routine general medical examination at a health care facility 03/22/2013   Allergic rhinitis, cause unspecified 03/22/2013   Depression with anxiety 10/31/2011   Past Medical History:  Diagnosis Date   Abnormal Pap smear    Allergy    Anxiety    Complication of anesthesia    Depression    History of chicken pox    Hx of TB skin testing    Hyperlipidemia    diet controlled - no meds   Migraine    PONV (postoperative nausea and vomiting)    PPD positive, treated 2004   currently no symptoms, chest xrays negative per pt 09/30/20    Family History  Problem Relation Age of Onset   Stroke Mother    Dementia Mother    Hyperlipidemia Mother    Early death Father    Heart disease Father    Hyperlipidemia Father    Cancer Neg Hx    Depression Neg Hx     Diabetes Neg Hx    Drug abuse Neg Hx    Hearing loss Neg Hx    Hypertension Neg Hx     Past Surgical History:  Procedure Laterality Date   ANTERIOR CRUCIATE LIGAMENT REPAIR Left 06/11/2020   Procedure: LEFT KNEE ANTERIOR CRUCIATE LIGAMENT (ACL) RECONSTRUCTION WITH WITH QUAD ALLOGRAFT, MENISCAL REPAIR vs DEBRIDEMENT;  Surgeon: Cammy Copa, MD;  Location: [facility] OR;  Service: Orthopedics;  Laterality: Left;   CARPAL TUNNEL RELEASE Right 10/01/2020   Procedure: RIGHT CARPAL TUNNEL RELEASE;  Surgeon: Cammy Copa, MD;  Location: [facility];  Service: Orthopedics;  Laterality: Right;   CHOLECYSTECTOMY, LAPAROSCOPIC  07/13/2000   COLPOSCOPY  2000,    EYE SURGERY Bilateral 07/2017   Lasik  TONSILLECTOMY     WISDOM TOOTH EXTRACTION  1995 Jan   Social History   Occupational History   Not on file  Tobacco Use   Smoking status: Every Day    Packs/day: 0.25    Years: 15.00    Total pack years: 3.75    Types: E-cigarettes, Cigarettes   Smokeless tobacco: Never  Vaping Use   Vaping Use: Never used  Substance and Sexual Activity   Alcohol use: Not Currently    Comment: occasional   Drug use: No   Sexual activity: Yes    Partners: Male    Birth control/protection: I.U.D.    Comment: Mirena IUD

## 2022-08-27 LAB — VITAMIN D 25 HYDROXY (VIT D DEFICIENCY, FRACTURES): Vit D, 25-Hydroxy: 39 ng/mL (ref 30–100)

## 2022-08-29 NOTE — Progress Notes (Signed)
Pls calal thx

## 2022-08-31 NOTE — Progress Notes (Signed)
Called and left VM

## 2022-09-01 ENCOUNTER — Ambulatory Visit (INDEPENDENT_AMBULATORY_CARE_PROVIDER_SITE_OTHER): Payer: Commercial Managed Care - PPO | Admitting: Internal Medicine

## 2022-09-01 ENCOUNTER — Encounter: Payer: Self-pay | Admitting: Internal Medicine

## 2022-09-01 VITALS — BP 130/78 | HR 62 | Temp 98.0°F | Ht 64.0 in | Wt 233.0 lb

## 2022-09-01 DIAGNOSIS — I10 Essential (primary) hypertension: Secondary | ICD-10-CM

## 2022-09-01 DIAGNOSIS — M545 Low back pain, unspecified: Secondary | ICD-10-CM | POA: Diagnosis not present

## 2022-09-01 DIAGNOSIS — G8929 Other chronic pain: Secondary | ICD-10-CM | POA: Diagnosis not present

## 2022-09-01 DIAGNOSIS — F17200 Nicotine dependence, unspecified, uncomplicated: Secondary | ICD-10-CM | POA: Insufficient documentation

## 2022-09-01 MED ORDER — CYCLOBENZAPRINE HCL 5 MG PO TABS: 5.0000 mg | ORAL_TABLET | Freq: Three times a day (TID) | ORAL | 1 refills | Status: DC | PRN

## 2022-09-01 MED ORDER — PREDNISONE 10 MG PO TABS: ORAL_TABLET | ORAL | 0 refills | Status: DC

## 2022-09-01 MED ORDER — TRAMADOL HCL 50 MG PO TABS: 50.0000 mg | ORAL_TABLET | Freq: Four times a day (QID) | ORAL | 0 refills | Status: DC | PRN

## 2022-09-01 NOTE — Assessment & Plan Note (Signed)
Now with evidence for back strain and muscular spasm, can't r/o underlying spine arthritis, now for trial tramadol prn, flexeril 5 tid prn, and prednisone, also work note given for yesterday and today

## 2022-09-01 NOTE — Progress Notes (Signed)
Patient ID: Danielle Cook, female   DOB: 13-Aug-1976, 46 y.o.   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  Denies urinary symptoms such as dysuria, frequency, urgency, flank pain, hematuria or n/v, fever, chills.  Denies worsening reflux, abd pain, dysphagia, n/v, bowel change or blood.  Has job with plenty of lifting heavy patients.  No LE pain, numbness, weakness or falls.  Still smoking, not ready to quit. 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         Wt Readings from Last 3 Encounters:  09/01/22 233 lb (105.7 kg)  10/06/21 216 lb (98 kg)  03/04/21 222 lb (100.7 kg)   BP Readings from Last 3 Encounters:  09/01/22 130/78  10/06/21 128/86  03/04/21 (!) 150/88         Past Medical History:  Diagnosis Date   Abnormal Pap smear    Allergy    Anxiety    Complication of anesthesia    Depression    History of chicken pox    Hx of TB skin testing    Hyperlipidemia    diet controlled - no meds   Migraine    PONV (postoperative nausea and vomiting)    PPD positive, treated 2004   currently no symptoms, chest xrays negative per pt 09/30/20   Past Surgical History:  Procedure Laterality Date   ANTERIOR CRUCIATE LIGAMENT REPAIR Left 06/11/2020   Procedure: LEFT KNEE ANTERIOR CRUCIATE LIGAMENT (ACL) RECONSTRUCTION WITH WITH QUAD ALLOGRAFT, MENISCAL REPAIR vs DEBRIDEMENT;  Surgeon: Cammy Copa, MD;  Location: [facility] OR;  Service: Orthopedics;  Laterality: Left;   CARPAL TUNNEL RELEASE Right 10/01/2020   Procedure: RIGHT CARPAL TUNNEL RELEASE;  Surgeon: Cammy Copa, MD;  Location: [facility] OR;  Service: Orthopedics;  Laterality: Right;   CHOLECYSTECTOMY, LAPAROSCOPIC  07/13/2000   COLPOSCOPY  2000,    EYE SURGERY Bilateral 07/2017   Lasik    TONSILLECTOMY     WISDOM TOOTH EXTRACTION  1995 Jan    reports that she has been smoking e-cigarettes and cigarettes. She has a 3.75 pack-year smoking history. She has never used smokeless tobacco. She reports that she does not currently use alcohol. She reports that she does not use drugs. family history includes Dementia in her mother; Early death in her father; Heart disease in her father; Hyperlipidemia in her father and mother; Stroke in her mother. Allergies  Allergen Reactions   Eggs Or Egg-Derived Products     Sensitivity    Guaifenesin Er Anxiety and Palpitations   Latex Hives and Rash   Wellbutrin [Bupropion Hcl] Anxiety and Palpitations   Current Outpatient Medications on File Prior to Visit  Medication Sig Dispense Refill   augmented betamethasone dipropionate (DIPROLENE-AF) 0.05 % cream Apply 1 application topically 2 (two) times daily. 30 g 1   busPIRone (BUSPAR) 10 MG tablet Take 1 tablet (10 mg total) by mouth 2 (two) times daily. 180 tablet 1   DULoxetine (CYMBALTA) 60 MG capsule TAKE 1 CAPSULE BY MOUTH DAILY 90 capsule 3   GARLIC PO Take 1 tablet by mouth daily at 12 noon. 100 mg/day     levonorgestrel (MIRENA) 20 MCG/24HR IUD 1 Intra  Uterine Device (1 each total) by Intrauterine route once. 1 each 0   Lifitegrast (XIIDRA) 5 % SOLN Place 1 drop into both eyes in the morning and at bedtime.     meloxicam (MOBIC) 15 MG tablet Take 1 tablet (15 mg total) by mouth daily. 90 tablet 1   Misc Natural Products (ELDERBERRY ZINC/VIT C/IMMUNE MT) Take 1 tablet by mouth daily.     Omega-3 Fatty Acids (FISH OIL PO) Take 1,500 mg by mouth daily.     No current facility-administered medications on file prior to visit.        ROS:  All others reviewed and negative.  Objective        PE:  BP 130/78 (BP Location: Left Arm, Patient Position: Sitting, Cuff Size: Large)   Pulse 62   Temp 98 F (36.7 C) (Oral)   Ht 5\' 4"  (1.626 m)   Wt 233 lb (105.7 kg)   SpO2 98%   BMI 39.99 kg/m                  Constitutional: Pt appears in NAD               HENT: Head: NCAT.                Right Ear: External ear normal.                 Left Ear: External ear normal.                Eyes: . Pupils are equal, round, and reactive to light. Conjunctivae and EOM are normal               Nose: without d/c or deformity               Neck: Neck supple. Gross normal ROM               Cardiovascular: Normal rate and regular rhythm.                 Pulmonary/Chest: Effort normal and breath sounds without rales or wheezing.                Abd:  Soft, NT, ND, + BS, no organomegaly               Spine nontender in midline, but has tender spasm to the right > left lumbar paravertebrals                 Neurological: Pt is alert. At baseline orientation, motor grossly intact               Skin: Skin is warm. No rashes, no other new lesions, LE edema - none               Psychiatric: Pt behavior is normal without agitation   Micro: none  Cardiac tracings I have personally interpreted today:  none  Pertinent Radiological findings (summarize): none   Lab Results  Component Value Date   WBC 6.9 10/06/2021   HGB 13.6 10/06/2021   HCT 41.4 10/06/2021   PLT 252.0 10/06/2021   GLUCOSE 92 10/06/2021   CHOL 218 (H) 10/06/2021   TRIG 120.0 10/06/2021   HDL 41.10 10/06/2021   LDLCALC 153 (H) 10/06/2021   ALT 16 08/13/2019   AST 16 08/13/2019   NA 142 10/06/2021   K 3.8 10/06/2021   CL 105 10/06/2021   CREATININE 0.54 10/06/2021   BUN 10  10/06/2021   CO2 32 10/06/2021   TSH 1.54 10/06/2021   MICROALBUR <0.7 08/24/2020   Assessment/Plan:  Danielle Cook is a 45 y.o. White or Caucasian [1] female with  has a past medical history of Abnormal Pap smear, Allergy, Anxiety, Complication of anesthesia, Depression, History of chicken pox, TB skin testing, Hyperlipidemia, Migraine, PONV (postoperative nausea and vomiting), and PPD positive, treated (2004).  Low back pain Now with evidence for  back strain and muscular spasm, can't r/o underlying spine arthritis, now for trial tramadol prn, flexeril 5 tid prn, and prednisone, also work note given for yesterday and today  Primary hypertension BP Readings from Last 3 Encounters:  09/01/22 130/78  10/06/21 128/86  03/04/21 (!) 150/88   Stable, pt to continue medical treatment   diet, low salt, exercise, wt control   Smoker Pt counseled to quit, pt not ready  Followup: Return if symptoms worsen or fail to improve.  Cathlean Cower, MD 09/01/2022 7:44 PM [facility]

## 2022-09-01 NOTE — Assessment & Plan Note (Signed)
BP Readings from Last 3 Encounters:  09/01/22 130/78  10/06/21 128/86  03/04/21 (!) 150/88   Stable, pt to continue medical treatment   diet, low salt, exercise, wt control

## 2022-09-01 NOTE — Patient Instructions (Signed)
Please take all new medication as prescribed - the tramadol for pain, flexeril muscle relaxer, and prednisone for anti-inflammatory  Please continue all other medications as before, and refills have been done if requested.  Please have the pharmacy call with any other refills you may need.  Please continue your efforts at being more active, low cholesterol diet, and weight control.  Please keep your appointments with your specialists as you may have planned

## 2022-09-01 NOTE — Assessment & Plan Note (Signed)
Pt counseled to quit, pt not ready 

## 2022-11-01 ENCOUNTER — Other Ambulatory Visit: Payer: Self-pay | Admitting: Internal Medicine

## 2022-11-01 DIAGNOSIS — F418 Other specified anxiety disorders: Secondary | ICD-10-CM

## 2022-11-01 MED ORDER — BUSPIRONE HCL 10 MG PO TABS: 10.0000 mg | ORAL_TABLET | Freq: Two times a day (BID) | ORAL | 0 refills | Status: DC

## 2022-11-24 ENCOUNTER — Other Ambulatory Visit: Payer: Self-pay | Admitting: Internal Medicine

## 2022-11-24 DIAGNOSIS — F418 Other specified anxiety disorders: Secondary | ICD-10-CM

## 2022-12-01 ENCOUNTER — Encounter: Payer: Self-pay | Admitting: Internal Medicine

## 2022-12-01 ENCOUNTER — Other Ambulatory Visit: Payer: Self-pay

## 2022-12-01 ENCOUNTER — Ambulatory Visit (INDEPENDENT_AMBULATORY_CARE_PROVIDER_SITE_OTHER): Payer: Commercial Managed Care - PPO | Admitting: Surgical

## 2022-12-01 ENCOUNTER — Ambulatory Visit: Payer: Commercial Managed Care - PPO | Admitting: Internal Medicine

## 2022-12-01 ENCOUNTER — Encounter: Payer: Self-pay | Admitting: Orthopedic Surgery

## 2022-12-01 DIAGNOSIS — I10 Essential (primary) hypertension: Secondary | ICD-10-CM | POA: Diagnosis not present

## 2022-12-01 DIAGNOSIS — F418 Other specified anxiety disorders: Secondary | ICD-10-CM | POA: Diagnosis not present

## 2022-12-01 DIAGNOSIS — G8929 Other chronic pain: Secondary | ICD-10-CM

## 2022-12-01 DIAGNOSIS — Z0001 Encounter for general adult medical examination with abnormal findings: Secondary | ICD-10-CM

## 2022-12-01 DIAGNOSIS — Z1231 Encounter for screening mammogram for malignant neoplasm of breast: Secondary | ICD-10-CM

## 2022-12-01 DIAGNOSIS — M7061 Trochanteric bursitis, right hip: Secondary | ICD-10-CM

## 2022-12-01 DIAGNOSIS — M545 Low back pain, unspecified: Secondary | ICD-10-CM

## 2022-12-01 DIAGNOSIS — Z1211 Encounter for screening for malignant neoplasm of colon: Secondary | ICD-10-CM | POA: Insufficient documentation

## 2022-12-01 DIAGNOSIS — M1712 Unilateral primary osteoarthritis, left knee: Secondary | ICD-10-CM

## 2022-12-01 DIAGNOSIS — M7062 Trochanteric bursitis, left hip: Secondary | ICD-10-CM

## 2022-12-01 LAB — HEPATIC FUNCTION PANEL
ALT: 24 U/L (ref 0–35)
AST: 23 U/L (ref 0–37)
Albumin: 4.3 g/dL (ref 3.5–5.2)
Alkaline Phosphatase: 68 U/L (ref 39–117)
Bilirubin, Direct: 0.1 mg/dL (ref 0.0–0.3)
Total Bilirubin: 0.3 mg/dL (ref 0.2–1.2)
Total Protein: 7 g/dL (ref 6.0–8.3)

## 2022-12-01 LAB — BASIC METABOLIC PANEL
BUN: 12 mg/dL (ref 6–23)
CO2: 30 mEq/L (ref 19–32)
Calcium: 9.8 mg/dL (ref 8.4–10.5)
Chloride: 102 mEq/L (ref 96–112)
Creatinine, Ser: 0.57 mg/dL (ref 0.40–1.20)
GFR: 108.57 mL/min (ref >60.00)
Glucose, Bld: 104 mg/dL — ABNORMAL HIGH (ref 70–99)
Potassium: 4.4 mEq/L (ref 3.5–5.1)
Sodium: 139 mEq/L (ref 135–145)

## 2022-12-01 LAB — URINALYSIS, ROUTINE W REFLEX MICROSCOPIC
Bilirubin Urine: NEGATIVE
Hgb urine dipstick: NEGATIVE
Ketones, ur: NEGATIVE
Nitrite: NEGATIVE
Specific Gravity, Urine: 1.015 (ref 1.000–1.030)
Total Protein, Urine: NEGATIVE
Urine Glucose: NEGATIVE
Urobilinogen, UA: 0.2 (ref 0.0–1.0)
pH: 5.5 (ref 5.0–8.0)

## 2022-12-01 LAB — CBC WITH DIFFERENTIAL/PLATELET
Basophils Absolute: 0.1 10*3/uL (ref 0.0–0.1)
Basophils Relative: 0.8 % (ref 0.0–3.0)
Eosinophils Absolute: 0.2 10*3/uL (ref 0.0–0.7)
Eosinophils Relative: 2 % (ref 0.0–5.0)
HCT: 42.4 % (ref 36.0–46.0)
Hemoglobin: 14.4 g/dL (ref 12.0–15.0)
Lymphocytes Relative: 23.6 % (ref 12.0–46.0)
Lymphs Abs: 1.8 10*3/uL (ref 0.7–4.0)
MCHC: 34 g/dL (ref 30.0–36.0)
MCV: 89.2 fl (ref 78.0–100.0)
Monocytes Absolute: 0.5 10*3/uL (ref 0.1–1.0)
Monocytes Relative: 6.9 % (ref 3.0–12.0)
Neutro Abs: 5.2 10*3/uL (ref 1.4–7.7)
Neutrophils Relative %: 66.7 % (ref 43.0–77.0)
Platelets: 249 10*3/uL (ref 150.0–400.0)
RBC: 4.76 Mil/uL (ref 3.87–5.11)
RDW: 13.5 % (ref 11.5–15.5)
WBC: 7.8 10*3/uL (ref 4.0–10.5)

## 2022-12-01 LAB — HEMOGLOBIN A1C: Hgb A1c MFr Bld: 5.7 % (ref 4.6–6.5)

## 2022-12-01 LAB — TSH: TSH: 1.39 u[IU]/mL (ref 0.35–5.50)

## 2022-12-01 MED ORDER — BUSPIRONE HCL 10 MG PO TABS: 10.0000 mg | ORAL_TABLET | Freq: Two times a day (BID) | ORAL | 1 refills | Status: DC

## 2022-12-01 MED ORDER — LIDOCAINE HCL 1 % IJ SOLN
5.0000 mL | INTRAMUSCULAR | Status: AC | PRN
  Administered 2022-12-01: 5 mL

## 2022-12-01 MED ORDER — METHYLPREDNISOLONE ACETATE 40 MG/ML IJ SUSP
40.0000 mg | INTRAMUSCULAR | Status: AC | PRN
  Administered 2022-12-01: 40 mg via INTRA_ARTICULAR

## 2022-12-01 MED ORDER — INDAPAMIDE 1.25 MG PO TABS: 1.2500 mg | ORAL_TABLET | Freq: Every day | ORAL | 1 refills | Status: DC

## 2022-12-01 MED ORDER — BUPIVACAINE HCL 0.25 % IJ SOLN
4.0000 mL | INTRAMUSCULAR | Status: AC | PRN
  Administered 2022-12-01: 4 mL via INTRA_ARTICULAR

## 2022-12-01 MED ORDER — MELOXICAM 15 MG PO TABS: 15.0000 mg | ORAL_TABLET | Freq: Every day | ORAL | 0 refills | Status: DC

## 2022-12-01 MED ORDER — DULOXETINE HCL 60 MG PO CPEP: ORAL_CAPSULE | ORAL | 1 refills | Status: DC

## 2022-12-01 MED ORDER — OLMESARTAN MEDOXOMIL 20 MG PO TABS: 20.0000 mg | ORAL_TABLET | Freq: Every day | ORAL | 1 refills | Status: DC

## 2022-12-01 NOTE — Patient Instructions (Addendum)
Hypertension, Adult High blood pressure (hypertension) is when the force of blood pumping through the arteries is too strong. The arteries are the blood vessels that carry blood from the heart throughout the body. Hypertension forces the heart to work harder to pump blood and may cause arteries to become narrow or stiff. Untreated or uncontrolled hypertension can lead to a heart attack, heart failure, a stroke, kidney disease, and other problems. A blood pressure reading consists of a higher number over a lower number. Ideally, your blood pressure should be below 120/80. The first ("top") number is called the systolic pressure. It is a measure of the pressure in your arteries as your heart beats. The second ("bottom") number is called the diastolic pressure. It is a measure of the pressure in your arteries as the heart relaxes. What are the causes? The exact cause of this condition is not known. There are some conditions that result in high blood pressure. What increases the risk? Certain factors may make you more likely to develop high blood pressure. Some of these risk factors are under your control, including: Smoking. Not getting enough exercise or physical activity. Being overweight. Having too much fat, sugar, calories, or salt (sodium) in your diet. Drinking too much alcohol. Other risk factors include: Having a personal history of heart disease, diabetes, high cholesterol, or kidney disease. Stress. Having a family history of high blood pressure and high cholesterol. Having obstructive sleep apnea. Age. The risk increases with age. What are the signs or symptoms? High blood pressure may not cause symptoms. Very high blood pressure (hypertensive crisis) may cause: Headache. Fast or irregular heartbeats (palpitations). Shortness of breath. Nosebleed. Nausea and vomiting. Vision changes. Severe chest pain, dizziness, and seizures. How is this diagnosed? This condition is diagnosed by  measuring your blood pressure while you are seated, with your arm resting on a flat surface, your legs uncrossed, and your feet flat on the floor. The cuff of the blood pressure monitor will be placed directly against the skin of your upper arm at the level of your heart. Blood pressure should be measured at least twice using the same arm. Certain conditions can cause a difference in blood pressure between your right and left arms. If you have a high blood pressure reading during one visit or you have normal blood pressure with other risk factors, you may be asked to: Return on a different day to have your blood pressure checked again. Monitor your blood pressure at home for 1 week or longer. If you are diagnosed with hypertension, you may have other blood or imaging tests to help your health care provider understand your overall risk for other conditions. How is this treated? This condition is treated by making healthy lifestyle changes, such as eating healthy foods, exercising more, and reducing your alcohol intake. You may be referred for counseling on a healthy diet and physical activity. Your health care provider may prescribe medicine if lifestyle changes are not enough to get your blood pressure under control and if: Your systolic blood pressure is above 130. Your diastolic blood pressure is above 80. Your personal target blood pressure may vary depending on your medical conditions, your age, and other factors. Follow these instructions at home: Eating and drinking  Eat a diet that is high in fiber and potassium, and low in sodium, added sugar, and fat. An example of this eating plan is called the DASH diet. DASH stands for Dietary Approaches to Stop Hypertension. To eat this way: Eat   plenty of fresh fruits and vegetables. Try to fill one half of your plate at each meal with fruits and vegetables. Eat whole grains, such as whole-wheat pasta, brown rice, or whole-grain bread. Fill about one  fourth of your plate with whole grains. Eat or drink low-fat dairy products, such as skim milk or low-fat yogurt. Avoid fatty cuts of meat, processed or cured meats, and poultry with skin. Fill about one fourth of your plate with lean proteins, such as fish, chicken without skin, beans, eggs, or tofu. Avoid pre-made and processed foods. These tend to be higher in sodium, added sugar, and fat. Reduce your daily sodium intake. Many people with hypertension should eat less than 1,500 mg of sodium a day. Do not drink alcohol if: Your health care provider tells you not to drink. You are pregnant, may be pregnant, or are planning to become pregnant. If you drink alcohol: Limit how much you have to: 0-1 drink a day for women. 0-2 drinks a day for men. Know how much alcohol is in your drink. In the [place] one drink equals one 12 oz bottle of beer (355 mL), one 5 oz glass of wine (148 mL), or one 1 oz glass of hard liquor (44 mL). Lifestyle  Work with your health care provider to maintain a healthy body weight or to lose weight. Ask what an ideal weight is for you. Get at least 30 minutes of exercise that causes your heart to beat faster (aerobic exercise) most days of the week. Activities may include walking, swimming, or biking. Include exercise to strengthen your muscles (resistance exercise), such as Pilates or lifting weights, as part of your weekly exercise routine. Try to do these types of exercises for 30 minutes at least 3 days a week. Do not use any products that contain nicotine or tobacco. These products include cigarettes, chewing tobacco, and vaping devices, such as e-cigarettes. If you need help quitting, ask your health care provider. Monitor your blood pressure at home as told by your health care provider. Keep all follow-up visits. This is important. Medicines Take over-the-counter and prescription medicines only as told by your health care provider. Follow directions carefully. Blood  pressure medicines must be taken as prescribed. Do not skip doses of blood pressure medicine. Doing this puts you at risk for problems and can make the medicine less effective. Ask your health care provider about side effects or reactions to medicines that you should watch for. Contact a health care provider if you: Think you are having a reaction to a medicine you are taking. Have headaches that keep coming back (recurring). Feel dizzy. Have swelling in your ankles. Have trouble with your vision. Get help right away if you: Develop a severe headache or confusion. Have unusual weakness or numbness. Feel faint. Have severe pain in your chest or abdomen. Vomit repeatedly. Have trouble breathing. These symptoms may be an emergency. Get help right away. Call 911. Do not wait to see if the symptoms will go away. Do not drive yourself to the hospital. Summary Hypertension is when the force of blood pumping through your arteries is too strong. If this condition is not controlled, it may put you at risk for serious complications. Your personal target blood pressure may vary depending on your medical conditions, your age, and other factors. For most people, a normal blood pressure is less than 120/80. Hypertension is treated with lifestyle changes, medicines, or a combination of both. Lifestyle changes include losing weight, eating a healthy,   low-sodium diet, exercising more, and limiting alcohol. This information is not intended to replace advice given to you by your health care provider. Make sure you discuss any questions you have with your health care provider. Document Revised: 08/24/2021 Document Reviewed: 08/24/2021 Elsevier Patient Education  2023 [employer]  

## 2022-12-01 NOTE — Progress Notes (Signed)
Office Visit Note   Patient: Danielle Cook           Date of Birth: February 22, 1976           MRN: [id number] Visit Date: 12/01/2022 Requested by: [name], MD [address] [place],  [place] [address] PCP: [name], MD  Subjective: Chief Complaint  Patient presents with   Left Knee - Pain    HPI: Danielle Cook is a 47 y.o. female who presents to the office reporting left knee pain.  Patient has history of left knee osteoarthritis.  She continues to complain of medial sided pain.  No new injury.  No instability.  She states that she has a stabbing pain in the medial side of her knee.  Not really much lateral pain.  She works doing 12-hour shifts which involves a lot of walking and standing and it aches at the end of the shift.  She has tried cortisone injections which helped for about 2 to 3 months and gel injections which have provided no relief for her..                ROS: All systems reviewed are negative as they relate to the chief complaint within the history of present illness.  Patient denies fevers or chills.  Assessment & Plan: Visit Diagnoses:  1. Unilateral primary osteoarthritis, left knee     Plan: Patient is a 47 year old female who returns for left knee injection.  She has history of left knee arthritis primarily localized to the medial compartment.  She has had meniscal debridement in the medial compartment from ACL surgery that she had several years ago.  Cortisone injections provide temporary relief and gel injections have not provided her really any relief.  She would like to retry repeat cortisone injection today.  Tolerated procedure well.  No effusion to be aspirated.  Plan to follow-up in 3 to 4 months for repeat injection.  She also would like to consider genicular nerve block with possible genicular nerve ablation to follow depending on her results from genicular nerve block.  She has read some about this and is interested in trying it as a  potential avenue to delay the need for knee arthroplasty in the future.  Plan to refer patient to Dr. Laurence Spates for genicular nerve block.  Will plan on having her do this at least 6 to 8 weeks from now so that she is more symptomatic and can better evaluate how much relief she gets.  Follow-Up Instructions: No follow-ups on file.   Orders:  No orders of the defined types were placed in this encounter.  No orders of the defined types were placed in this encounter.     Procedures: Large Joint Inj: L knee on 12/01/2022 10:47 AM Indications: diagnostic evaluation, joint swelling and pain Details: 18 G 1.5 in needle, superolateral approach  Arthrogram: No  Medications: 5 mL lidocaine 1 %; 40 mg methylPREDNISolone acetate 40 MG/ML; 4 mL bupivacaine 0.25 % Outcome: tolerated well, no immediate complications Procedure, treatment alternatives, risks and benefits explained, specific risks discussed. Consent was given by the patient. Immediately prior to procedure a time out was called to verify the correct patient, procedure, equipment, support staff and site/side marked as required. Patient was prepped and draped in the usual sterile fashion.       Clinical Data: No additional findings.  Objective: Vital Signs: There were no vitals taken for this visit.  Physical Exam:  Constitutional:  Patient appears well-developed HEENT:  Head: Normocephalic Eyes:EOM are normal Neck: Normal range of motion Cardiovascular: Normal rate Pulmonary/chest: Effort normal Neurologic: Patient is alert Skin: Skin is warm Psychiatric: Patient has normal mood and affect  Ortho Exam: Ortho exam demonstrates left knee with no effusion.  She has good quad strength.  Tenderness over the medial joint line.  No tenderness over the lateral joint line.  Very minimal tenderness over the pes anserine bursa.  She has well-healed incisions from prior ACL reconstruction.  No cellulitis or skin changes noted.  0 degrees  extension.  Specialty Comments:  No specialty comments available.  Imaging: No results found.   PMFS History: Patient Active Problem List   Diagnosis Date Noted   Morbid obesity ([facility]) 12/01/2022   Encounter for general adult medical examination with abnormal findings 12/01/2022   Smoker 09/01/2022   Moderate episode of recurrent major depressive disorder ([facility]) 01/06/2021   Greater trochanteric bursitis of both hips 01/06/2021   Low back pain 01/06/2021   Primary hypertension 08/24/2020   Right carpal tunnel syndrome 12/06/2019   Xerosis cutis 10/16/2019   Nicotine vapor product user 08/09/2018   Visit for screening mammogram 12/13/2016   Family history of premature CAD 12/13/2016   Radiculitis of left cervical region 03/23/2016   PPD positive, treated 03/22/2013   Routine general medical examination at a health care facility 03/22/2013   Allergic rhinitis, cause unspecified 03/22/2013   Depression with anxiety 10/31/2011   Past Medical History:  Diagnosis Date   Abnormal Pap smear    Allergy    Anxiety    Complication of anesthesia    Depression    History of chicken pox    Hx of TB skin testing    Hyperlipidemia    diet controlled - no meds   Migraine    PONV (postoperative nausea and vomiting)    PPD positive, treated 2004   currently no symptoms, chest xrays negative per pt 09/30/20    Family History  Problem Relation Age of Onset   Stroke Mother    Dementia Mother    Hyperlipidemia Mother    Early death Father    Heart disease Father    Hyperlipidemia Father    Cancer Neg Hx    Depression Neg Hx    Diabetes Neg Hx    Drug abuse Neg Hx    Hearing loss Neg Hx    Hypertension Neg Hx     Past Surgical History:  Procedure Laterality Date   ANTERIOR CRUCIATE LIGAMENT REPAIR Left 06/11/2020   Procedure: LEFT KNEE ANTERIOR CRUCIATE LIGAMENT (ACL) RECONSTRUCTION WITH WITH QUAD ALLOGRAFT, MENISCAL REPAIR vs DEBRIDEMENT;  Surgeon: Meredith Pel, MD;   Location: [facility];  Service: Orthopedics;  Laterality: Left;   CARPAL TUNNEL RELEASE Right 10/01/2020   Procedure: RIGHT CARPAL TUNNEL RELEASE;  Surgeon: Meredith Pel, MD;  Location: [facility];  Service: Orthopedics;  Laterality: Right;   CHOLECYSTECTOMY, LAPAROSCOPIC  07/13/2000   COLPOSCOPY  2000,    EYE SURGERY Bilateral 07/2017   Lasik   TONSILLECTOMY     WISDOM TOOTH EXTRACTION  1995 Jan   Social History   Occupational History   Not on file  Tobacco Use   Smoking status: Every Day    Packs/day: 0.25    Years: 15.00    Total pack years: 3.75    Types: E-cigarettes, Cigarettes   Smokeless tobacco: Never  Vaping Use   Vaping Use: Never used  Substance and Sexual Activity  Alcohol use: Not Currently    Comment: occasional   Drug use: No   Sexual activity: Yes    Partners: Male    Birth control/protection: I.U.D.    Comment: Mirena IUD

## 2022-12-01 NOTE — Progress Notes (Signed)
Subjective:  Patient ID: Danielle Cook, female    DOB: 08/26/1976  Age: 47 y.o. MRN: [id number]  CC: Annual Exam, Hypertension, Back Pain, and Osteoarthritis   HPI Anderia Lorenzo Hence presents for a CPX and f/up -     She is concerned that her blood pressure is not well-controlled because she has had headaches for quite a while.  She is active and denies chest pain, shortness of breath, diaphoresis, or edema.  Outpatient Medications Prior to Visit  Medication Sig Dispense Refill   augmented betamethasone dipropionate (DIPROLENE-AF) 0.05 % cream Apply 1 application topically 2 (two) times daily. 30 g 1   cyclobenzaprine (FLEXERIL) 5 MG tablet Take 1 tablet (5 mg total) by mouth 3 (three) times daily as needed. 40 tablet 1   levonorgestrel (MIRENA) 20 MCG/24HR IUD 1 Intra Uterine Device (1 each total) by Intrauterine route once. 1 each 0   Misc Natural Products (ELDERBERRY ZINC/VIT C/IMMUNE MT) Take 1 tablet by mouth daily.     busPIRone (BUSPAR) 10 MG tablet TAKE 1 TABLET BY MOUTH TWICE A DAY 180 tablet 0   DULoxetine (CYMBALTA) 60 MG capsule TAKE 1 CAPSULE BY MOUTH DAILY 90 capsule 3   meloxicam (MOBIC) 15 MG tablet Take 1 tablet (15 mg total) by mouth daily. 90 tablet 1   traMADol (ULTRAM) 50 MG tablet Take 1 tablet (50 mg total) by mouth every 6 (six) hours as needed. 30 tablet 0   No facility-administered medications prior to visit.    ROS Review of Systems  Constitutional:  Positive for unexpected weight change (wt gain). Negative for appetite change, chills, diaphoresis and fatigue.  HENT: Negative.    Eyes: Negative.  Negative for visual disturbance.  Respiratory:  Negative for apnea, cough, shortness of breath and wheezing.   Cardiovascular:  Negative for chest pain, palpitations and leg swelling.  Gastrointestinal:  Negative for abdominal pain, diarrhea, nausea and vomiting.  Endocrine: Negative.   Genitourinary: Negative.  Negative for difficulty urinating, dysuria and  hematuria.  Musculoskeletal:  Positive for arthralgias and back pain. Negative for joint swelling and myalgias.  Neurological:  Positive for headaches. Negative for dizziness, weakness and light-headedness.  Hematological:  Negative for adenopathy. Does not bruise/bleed easily.  Psychiatric/Behavioral:  Positive for dysphoric mood. Negative for confusion, decreased concentration, sleep disturbance and suicidal ideas. The patient is nervous/anxious.     Objective:  BP (!) 142/96 (BP Location: Left Arm, Patient Position: Sitting, Cuff Size: Large)   Pulse 72   Temp 97.8 F (36.6 C) (Oral)   Resp 16   Ht 5\' 4"  (1.626 m)   Wt 235 lb (106.6 kg)   LMP 11/17/2022 (Approximate)   SpO2 97%   BMI 40.34 kg/m   BP Readings from Last 3 Encounters:  12/01/22 (!) 142/96  09/01/22 130/78  10/06/21 128/86    Wt Readings from Last 3 Encounters:  12/01/22 235 lb (106.6 kg)  09/01/22 233 lb (105.7 kg)  10/06/21 216 lb (98 kg)    Physical Exam Vitals reviewed.  Constitutional:      Appearance: She is obese. She is not ill-appearing.  HENT:     Nose: Nose normal.     Mouth/Throat:     Mouth: Mucous membranes are moist.  Eyes:     General: No scleral icterus.    Conjunctiva/sclera: Conjunctivae normal.  Cardiovascular:     Rate and Rhythm: Normal rate. Occasional Extrasystoles are present.    Heart sounds: No murmur heard.    No  friction rub. No gallop.     Comments: EKG- SR with one PVC, 66 bpm No LVH  Isolated Q wave in III Pulmonary:     Effort: Pulmonary effort is normal.     Breath sounds: No stridor. No wheezing, rhonchi or rales.  Abdominal:     General: Abdomen is protuberant. Bowel sounds are normal. There is no distension.     Palpations: Abdomen is soft. There is no hepatomegaly, splenomegaly or mass.     Tenderness: There is no abdominal tenderness.  Musculoskeletal:     Cervical back: Neck supple.     Right lower leg: No edema.     Left lower leg: No edema.   Lymphadenopathy:     Cervical: No cervical adenopathy.  Skin:    General: Skin is warm and dry.  Neurological:     General: No focal deficit present.     Mental Status: She is alert. Mental status is at baseline.  Psychiatric:        Mood and Affect: Mood normal.        Behavior: Behavior normal.     Lab Results  Component Value Date   WBC 7.8 12/01/2022   HGB 14.4 12/01/2022   HCT 42.4 12/01/2022   PLT 249.0 12/01/2022   GLUCOSE 104 (H) 12/01/2022   CHOL 218 (H) 10/06/2021   TRIG 120.0 10/06/2021   HDL 41.10 10/06/2021   LDLCALC 153 (H) 10/06/2021   ALT 24 12/01/2022   AST 23 12/01/2022   NA 139 12/01/2022   K 4.4 12/01/2022   CL 102 12/01/2022   CREATININE 0.57 12/01/2022   BUN 12 12/01/2022   CO2 30 12/01/2022   TSH 1.39 12/01/2022   HGBA1C 5.7 12/01/2022   MICROALBUR <0.7 08/24/2020    No results found.  Assessment & Plan:   Laporche was seen today for annual exam, hypertension, back pain and osteoarthritis.  Diagnoses and all orders for this visit:  Morbid obesity ([facility])- Labs are negative for secondary causes or complications. -     TSH; Future -     Hepatic function panel; Future -     Hemoglobin A1c; Future -     Basic metabolic panel; Future -     Basic metabolic panel -     Hemoglobin A1c -     Hepatic function panel -     TSH  Depression with anxiety -     busPIRone (BUSPAR) 10 MG tablet; Take 1 tablet (10 mg total) by mouth 2 (two) times daily. -     DULoxetine (CYMBALTA) 60 MG capsule; TAKE 1 CAPSULE BY MOUTH DAILY  Greater trochanteric bursitis of both hips -     meloxicam (MOBIC) 15 MG tablet; Take 1 tablet (15 mg total) by mouth daily.  Chronic bilateral low back pain without sciatica -     DULoxetine (CYMBALTA) 60 MG capsule; TAKE 1 CAPSULE BY MOUTH DAILY  Primary hypertension- Her blood pressure is not at goal and she is symptomatic.  I recommended that she start taking an ARB and thiazide diuretic.  EKG is negative for LVH.  Will  check labs to screen for secondary causes and endorgan damage. -     Aldosterone + renin activity w/ ratio; Future -     TSH; Future -     Urinalysis, Routine w reflex microscopic; Future -     Hepatic function panel; Future -     CBC with Differential/Platelet; Future -     Basic metabolic  panel; Future -     EKG 12-Lead -     Basic metabolic panel -     CBC with Differential/Platelet -     Hepatic function panel -     Urinalysis, Routine w reflex microscopic -     TSH -     Aldosterone + renin activity w/ ratio -     Discontinue: olmesartan (BENICAR) 20 MG tablet; Take 1 tablet (20 mg total) by mouth daily. -     indapamide (LOZOL) 1.25 MG tablet; Take 1 tablet (1.25 mg total) by mouth daily.  Encounter for general adult medical examination with abnormal findings- Exam completed, labs reviewed, vaccines reviewed and updated, cancer screenings addressed, patient education was given.  Visit for screening mammogram -     MM DIGITAL SCREENING BILATERAL; Future  Low back pain, unspecified back pain laterality, unspecified chronicity, unspecified whether sciatica present -     meloxicam (MOBIC) 15 MG tablet; Take 1 tablet (15 mg total) by mouth daily.   I have discontinued Larry Sierras. [place]'s traMADol and olmesartan. I have also changed her busPIRone. Additionally, I am having her start on indapamide. Lastly, I am having her maintain her levonorgestrel, Misc Natural Products (ELDERBERRY ZINC/VIT C/IMMUNE MT), augmented betamethasone dipropionate, cyclobenzaprine, DULoxetine, and meloxicam.  Meds ordered this encounter  Medications   busPIRone (BUSPAR) 10 MG tablet    Sig: Take 1 tablet (10 mg total) by mouth 2 (two) times daily.    Dispense:  180 tablet    Refill:  1   DULoxetine (CYMBALTA) 60 MG capsule    Sig: TAKE 1 CAPSULE BY MOUTH DAILY    Dispense:  90 capsule    Refill:  1   DISCONTD: olmesartan (BENICAR) 20 MG tablet    Sig: Take 1 tablet (20 mg total) by mouth daily.     Dispense:  90 tablet    Refill:  1   indapamide (LOZOL) 1.25 MG tablet    Sig: Take 1 tablet (1.25 mg total) by mouth daily.    Dispense:  90 tablet    Refill:  1   meloxicam (MOBIC) 15 MG tablet    Sig: Take 1 tablet (15 mg total) by mouth daily.    Dispense:  90 tablet    Refill:  0     Follow-up: Return in about 6 months (around 06/01/2023).  Scarlette Calico, MD

## 2022-12-02 ENCOUNTER — Ambulatory Visit: Payer: Commercial Managed Care - PPO | Admitting: Surgical

## 2022-12-02 ENCOUNTER — Other Ambulatory Visit: Payer: Self-pay | Admitting: Internal Medicine

## 2022-12-02 ENCOUNTER — Encounter: Payer: Self-pay | Admitting: Internal Medicine

## 2022-12-02 DIAGNOSIS — M545 Low back pain, unspecified: Secondary | ICD-10-CM

## 2022-12-02 DIAGNOSIS — M7061 Trochanteric bursitis, right hip: Secondary | ICD-10-CM

## 2022-12-06 LAB — ALDOSTERONE + RENIN ACTIVITY W/ RATIO
ALDO / PRA Ratio: 5 Ratio (ref 0.9–28.9)
Aldosterone: 8 ng/dL
Renin Activity: 1.61 ng/mL/h (ref 0.25–5.82)

## 2022-12-07 ENCOUNTER — Encounter: Payer: Self-pay | Admitting: Internal Medicine

## 2022-12-08 ENCOUNTER — Other Ambulatory Visit: Payer: Self-pay | Admitting: Internal Medicine

## 2022-12-08 DIAGNOSIS — F411 Generalized anxiety disorder: Secondary | ICD-10-CM

## 2022-12-08 MED ORDER — CLONAZEPAM 0.5 MG PO TABS: 0.5000 mg | ORAL_TABLET | Freq: Two times a day (BID) | ORAL | 0 refills | Status: DC | PRN

## 2022-12-15 LAB — HM MAMMOGRAPHY: HM Mammogram: NORMAL (ref 0–4)

## 2023-02-07 LAB — HM PAP SMEAR

## 2023-02-16 ENCOUNTER — Telehealth: Payer: Self-pay | Admitting: Physical Medicine and Rehabilitation

## 2023-02-16 NOTE — Telephone Encounter (Signed)
Patient called. Would like an appointment with Dr. Alvester Morin her call back number is [phone]

## 2023-02-17 NOTE — Telephone Encounter (Signed)
Spoke with patient and scheduled OV for 02/21/23.  

## 2023-02-17 NOTE — Telephone Encounter (Signed)
LVM to return call to get more information 

## 2023-02-21 ENCOUNTER — Ambulatory Visit: Payer: Commercial Managed Care - PPO | Admitting: Physical Medicine and Rehabilitation

## 2023-02-21 ENCOUNTER — Encounter: Payer: Self-pay | Admitting: Physical Medicine and Rehabilitation

## 2023-02-21 VITALS — BP 128/84 | HR 73

## 2023-02-21 DIAGNOSIS — M25562 Pain in left knee: Secondary | ICD-10-CM

## 2023-02-21 DIAGNOSIS — G8929 Other chronic pain: Secondary | ICD-10-CM | POA: Diagnosis not present

## 2023-02-21 DIAGNOSIS — Z9889 Other specified postprocedural states: Secondary | ICD-10-CM

## 2023-02-21 DIAGNOSIS — M1712 Unilateral primary osteoarthritis, left knee: Secondary | ICD-10-CM

## 2023-02-21 NOTE — Progress Notes (Signed)
Functional Pain Scale - descriptive words and definitions  Moderate (4)   Constantly aware of pain, can complete ADLs with modification/sleep marginally affected at times/passive distraction is of no use, but active distraction gives some relief. Moderate range order  Average Pain 4-10, depending on the day's activities and the weather   +Driver, -BT, -Dye Allergies.

## 2023-02-21 NOTE — Progress Notes (Signed)
Danielle Cook - 47 y.o. female MRN [id number]  Date of birth: 1976-06-13  Office Visit Note: Visit Date: 02/21/2023 PCP: Etta Grandchild, MD Referred by: Etta Grandchild, MD  Subjective: Chief Complaint  Patient presents with   Left Knee - Pain   discuss genicular nerve block   HPI: Danielle Cook is a 47 y.o. female who comes in today per the request of Danielle Cook, [place] for evaluation of chronic, worsening and severe left sided knee pain. Pain ongoing for several years, worsens with prolonged sitting and cold weather. She describes her pain as sharp and aching, currently rates as 7 out of 10. Some relief of pain with home exercise regimen, rest and use of medications. History of physical therapy with some relief of pain. Left knee x-rays from October of 2023 exhibits medial joint space narrowing. Patient underwent left ACL reconstruction with meniscal repair with Dr. Dorene Grebe in 2021, reports her pain increased post surgery. Has tried cortisone injections in the past with short lived pain relief, no relief of pain with previous gel injections. Dr. August Saucer has discussed possible left knee replacement with her, however she is trying to hold on surgery at this time. Patient states she is interested in possible genicular nerve ablation. Patient denies recent trauma or falls.    Review of Systems  Musculoskeletal:  Positive for joint pain.  Neurological:  Negative for tingling, sensory change, focal weakness and weakness.  All other systems reviewed and are negative.  Otherwise per HPI.  Assessment & Plan: Visit Diagnoses:    ICD-10-CM   1. Chronic pain of left knee  M25.562 Ambulatory referral to Physical Medicine Rehab   G89.29     2. Unilateral primary osteoarthritis, left knee  M17.12 Ambulatory referral to Physical Medicine Rehab    3. S/P left knee arthroscopy  Z98.890 Ambulatory referral to Physical Medicine Rehab       Plan: Findings:  Chronic, worsening and severe left  knee pain. Patient continues to have severe pain despite good conservative therapies such as formal physical therapy, home exercise regimen, rest and use of medications. Patients clinical presentation and exam are consistent with left knee pathology, there is medial joint space narrowing. Next step is to perform diagnostic and hopefully therapeutic left genicular nerve blocks under fluoroscopic guidance. If good relief of pain with genicular nerve blocks we did discuss possibility of longer sustained pain relief with radiofrequency ablation procedure. I discussed injection procedure and RFA with patient in detail today, also provided her with information regarding ablation to take home and review. If her pain persists we would recommend follow up with [facility]/Dr. August Saucer to discuss surgical options. No red flag symptoms noted upon exam today.     Meds & Orders: No orders of the defined types were placed in this encounter.   Orders Placed This Encounter  Procedures   Ambulatory referral to Physical Medicine Rehab    Follow-up: Return for Left genicular nerve block.   Procedures: No procedures performed      Clinical History: 1-2 Views of Left Knee 08/26/2022  AP lateral radiographs left knee reviewed.  Medial joint space narrowing  is present compared to prior radiographs as well as compared to the right knee.  No acute fracture.  No bone tunnel widening.  Lateral compartment and patellofemoral compartments appear intact.  Alignment intact.   She reports that she has been smoking e-cigarettes and cigarettes. She has a 3.75 pack-year smoking history. She has never used smokeless  tobacco.  Recent Labs    12/01/22 1036  HGBA1C 5.7    Objective:  VS:  HT:    WT:   BMI:     BP:128/84  HR:73bpm  TEMP: ( )  RESP:  Physical Exam Vitals and nursing note reviewed.  HENT:     Head: Normocephalic and atraumatic.     Right Ear: External ear normal.     Left Ear: External ear normal.     Nose:  Nose normal.     Mouth/Throat:     Mouth: Mucous membranes are moist.  Eyes:     Extraocular Movements: Extraocular movements intact.  Cardiovascular:     Rate and Rhythm: Normal rate.     Pulses: Normal pulses.  Pulmonary:     Effort: Pulmonary effort is normal.  Abdominal:     General: Abdomen is flat. There is no distension.  Musculoskeletal:        General: Tenderness present.     Cervical back: Normal range of motion.     Comments: No swelling noted to left knee, tenderness upon palpation of medial joint line. She is ambulatory without assistive device.   Skin:    General: Skin is warm and dry.     Capillary Refill: Capillary refill takes less than 2 seconds.  Neurological:     General: No focal deficit present.     Mental Status: She is alert and oriented to person, place, and time.  Psychiatric:        Mood and Affect: Mood normal.        Behavior: Behavior normal.     Ortho Exam  Imaging: No results found.  Past Medical/Family/Surgical/Social History: Medications & Allergies reviewed per EMR, new medications updated. Patient Active Problem List   Diagnosis Date Noted   Morbid obesity 12/01/2022   Encounter for general adult medical examination with abnormal findings 12/01/2022   Smoker 09/01/2022   Moderate episode of recurrent major depressive disorder 01/06/2021   Greater trochanteric bursitis of both hips 01/06/2021   Low back pain 01/06/2021   Primary hypertension 08/24/2020   Right carpal tunnel syndrome 12/06/2019   Xerosis cutis 10/16/2019   Nicotine vapor product user 08/09/2018   Visit for screening mammogram 12/13/2016   Family history of premature CAD 12/13/2016   Radiculitis of left cervical region 03/23/2016   PPD positive, treated 03/22/2013   Routine general medical examination at a health care facility 03/22/2013   Allergic rhinitis, cause unspecified 03/22/2013   Depression with anxiety 10/31/2011   Past Medical History:  Diagnosis Date    Abnormal Pap smear    Allergy    Anxiety    Complication of anesthesia    Depression    History of chicken pox    Hx of TB skin testing    Hyperlipidemia    diet controlled - no meds   Migraine    PONV (postoperative nausea and vomiting)    PPD positive, treated 2004   currently no symptoms, chest xrays negative per pt 09/30/20   Family History  Problem Relation Age of Onset   Stroke Mother    Dementia Mother    Hyperlipidemia Mother    Early death Father    Heart disease Father    Hyperlipidemia Father    Cancer Neg Hx    Depression Neg Hx    Diabetes Neg Hx    Drug abuse Neg Hx    Hearing loss Neg Hx    Hypertension Neg Hx  Past Surgical History:  Procedure Laterality Date   ANTERIOR CRUCIATE LIGAMENT REPAIR Left 06/11/2020   Procedure: LEFT KNEE ANTERIOR CRUCIATE LIGAMENT (ACL) RECONSTRUCTION WITH WITH QUAD ALLOGRAFT, MENISCAL REPAIR vs DEBRIDEMENT;  Surgeon: Cammy Copa, MD;  Location: [facility];  Service: Orthopedics;  Laterality: Left;   CARPAL TUNNEL RELEASE Right 10/01/2020   Procedure: RIGHT CARPAL TUNNEL RELEASE;  Surgeon: Cammy Copa, MD;  Location: [facility] OR;  Service: Orthopedics;  Laterality: Right;   CHOLECYSTECTOMY, LAPAROSCOPIC  07/13/2000   COLPOSCOPY  2000,    EYE SURGERY Bilateral 07/2017   Lasik   TONSILLECTOMY     WISDOM TOOTH EXTRACTION  1995 Jan   Social History   Occupational History   Not on file  Tobacco Use   Smoking status: Every Day    Packs/day: 0.25    Years: 15.00    Additional pack years: 0.00    Total pack years: 3.75    Types: E-cigarettes, Cigarettes   Smokeless tobacco: Never  Vaping Use   Vaping Use: Never used  Substance and Sexual Activity   Alcohol use: Not Currently    Comment: occasional   Drug use: No   Sexual activity: Yes    Partners: Male    Birth control/protection: I.U.D.    Comment: Mirena IUD

## 2023-03-01 ENCOUNTER — Ambulatory Visit (INDEPENDENT_AMBULATORY_CARE_PROVIDER_SITE_OTHER): Payer: Commercial Managed Care - PPO | Admitting: Surgical

## 2023-03-01 DIAGNOSIS — M1712 Unilateral primary osteoarthritis, left knee: Secondary | ICD-10-CM | POA: Diagnosis not present

## 2023-03-01 DIAGNOSIS — R202 Paresthesia of skin: Secondary | ICD-10-CM

## 2023-03-01 DIAGNOSIS — R200 Anesthesia of skin: Secondary | ICD-10-CM | POA: Diagnosis not present

## 2023-03-05 ENCOUNTER — Encounter: Payer: Self-pay | Admitting: Surgical

## 2023-03-05 NOTE — Progress Notes (Signed)
Office Visit Note   Patient: Danielle Cook           Date of Birth: [birth year]           MRN: [id number] Visit Date: 03/01/2023 Requested by: Etta Grandchild, MD [address] [place],  [place] [address] PCP: Etta Grandchild, MD  Subjective: Chief Complaint  Patient presents with   Left Knee - Pain    HPI: Danielle Cook is a 47 y.o. female who presents to the office reporting left knee pain and left hand numbness.  Patient reports history of left knee osteoarthritis.  She has seen Aundra Millet, Dr.  Blas nurse practitioner and is planning on proceeding with genicular nerve block to see if this is helpful.  Waiting on insurance authorization currently.  Taking meloxicam 3-4 times a week for her knee pain which is helpful.  She also complains of left hand numbness.  This numbness bothers her in the left hand in the median nerve distribution.  She has just ulnar-sided numbness in the dorsal numbness.  Has history of carpal tunnel syndrome that primarily bothers her during her pregnancy years ago but most recently it started back with left hand numbness in the last week or 2.  Denies any history of recent injury.  She denies any decreased grip strength or dropping objects.  No neck pain or radicular pain down the arm.  She has not really tried anything to help her symptoms and has not tried bracing..                ROS: All systems reviewed are negative as they relate to the chief complaint within the history of present illness.  Patient denies fevers or chills.  Assessment & Plan: Visit Diagnoses:  1. Numbness and tingling in left hand   2. Unilateral primary osteoarthritis, left knee     Plan: Patient is a 47 year old female who presents for evaluation of left hand numbness primarily.  She has numbness in the median nerve distribution that has been primarily bothering her the last 1 to 2 weeks.  Has history of carpal tunnel syndrome that was surgically treated in the right hand with  good result by Dr. August Saucer back in December 2021.  Left carpal tunnel symptoms first began when she had her pregnancy years ago but subsided after she delivered and have returned in the last couple weeks.  No weakness on exam.  No atrophy.  After discussion of options, plan to try night bracing with volar wrist splint.  We will see her back 4 weeks after her genicular nerve block with Dr. Alvester Morin to see how her hand is doing and to see how the block helped her knee.  Hold off on knee injection today so that she can tell this difference with genicular nerve block.  If bracing does not help, plan to try nerve conduction study and discussion of surgical release just like she had on the right hand.  Follow-Up Instructions: No follow-ups on file.   Orders:  No orders of the defined types were placed in this encounter.  No orders of the defined types were placed in this encounter.     Procedures: No procedures performed   Clinical Data: No additional findings.  Objective: Vital Signs: There were no vitals taken for this visit.  Physical Exam:  Constitutional: Patient appears well-developed HEENT:  Head: Normocephalic Eyes:EOM are normal Neck: Normal range of motion Cardiovascular: Normal rate Pulmonary/chest: Effort normal Neurologic: Patient is alert Skin:  Skin is warm Psychiatric: Patient has normal mood and affect  Ortho Exam: Ortho exam demonstrates left hand with no atrophy noted.  Intact EPL, FPL, grip strength, finger abduction with excellent strength.  No tenderness throughout the left hand and specifically no tenderness over the first CMC, anatomic snuffbox, 3-4 portal, 4-5 portal, scaphoid tubercle, radial styloid.  Negative Finkelstein's test.  She has negative Tinel sign.  Weakly positive Durkan sign.  No subluxing ulnar nerve of the left upper extremity.  Specialty Comments:  1-2 Views of Left Knee 08/26/2022  AP lateral radiographs left knee reviewed.  Medial joint space  narrowing  is present compared to prior radiographs as well as compared to the right knee.  No acute fracture.  No bone tunnel widening.  Lateral compartment and patellofemoral compartments appear intact.  Alignment intact.  Imaging: No results found.   PMFS History: Patient Active Problem List   Diagnosis Date Noted   Morbid obesity (HCC) 12/01/2022   Encounter for general adult medical examination with abnormal findings 12/01/2022   Smoker 09/01/2022   Moderate episode of recurrent major depressive disorder (HCC) 01/06/2021   Greater trochanteric bursitis of both hips 01/06/2021   Low back pain 01/06/2021   Primary hypertension 08/24/2020   Right carpal tunnel syndrome 12/06/2019   Xerosis cutis 10/16/2019   Nicotine vapor product user 08/09/2018   Visit for screening mammogram 12/13/2016   Family history of premature CAD 12/13/2016   Radiculitis of left cervical region 03/23/2016   PPD positive, treated 03/22/2013   Routine general medical examination at a health care facility 03/22/2013   Allergic rhinitis, cause unspecified 03/22/2013   Depression with anxiety 10/31/2011   Past Medical History:  Diagnosis Date   Abnormal Pap smear    Allergy    Anxiety    Complication of anesthesia    Depression    History of chicken pox    Hx of TB skin testing    Hyperlipidemia    diet controlled - no meds   Migraine    PONV (postoperative nausea and vomiting)    PPD positive, treated 2004   currently no symptoms, chest xrays negative per pt 09/30/20    Family History  Problem Relation Age of Onset   Stroke Mother    Dementia Mother    Hyperlipidemia Mother    Early death Father    Heart disease Father    Hyperlipidemia Father    Cancer Neg Hx    Depression Neg Hx    Diabetes Neg Hx    Drug abuse Neg Hx    Hearing loss Neg Hx    Hypertension Neg Hx     Past Surgical History:  Procedure Laterality Date   ANTERIOR CRUCIATE LIGAMENT REPAIR Left 06/11/2020   Procedure:  LEFT KNEE ANTERIOR CRUCIATE LIGAMENT (ACL) RECONSTRUCTION WITH WITH QUAD ALLOGRAFT, MENISCAL REPAIR vs DEBRIDEMENT;  Surgeon: Cammy Copa, MD;  Location: [facility] OR;  Service: Orthopedics;  Laterality: Left;   CARPAL TUNNEL RELEASE Right 10/01/2020   Procedure: RIGHT CARPAL TUNNEL RELEASE;  Surgeon: Cammy Copa, MD;  Location: [facility] OR;  Service: Orthopedics;  Laterality: Right;   CHOLECYSTECTOMY, LAPAROSCOPIC  07/13/2000   COLPOSCOPY  2000,    EYE SURGERY Bilateral 07/2017   Lasik   TONSILLECTOMY     WISDOM TOOTH EXTRACTION  1995 Jan   Social History   Occupational History   Not on file  Tobacco Use   Smoking status: Every Day    Packs/day: 0.25  Years: 15.00    Additional pack years: 0.00    Total pack years: 3.75    Types: E-cigarettes, Cigarettes   Smokeless tobacco: Never  Vaping Use   Vaping Use: Never used  Substance and Sexual Activity   Alcohol use: Not Currently    Comment: occasional   Drug use: No   Sexual activity: Yes    Partners: Male    Birth control/protection: I.U.D.    Comment: Mirena IUD

## 2023-03-13 ENCOUNTER — Other Ambulatory Visit: Payer: Self-pay

## 2023-03-13 ENCOUNTER — Encounter: Payer: Self-pay | Admitting: Physical Medicine and Rehabilitation

## 2023-03-13 ENCOUNTER — Ambulatory Visit: Payer: Commercial Managed Care - PPO | Admitting: Physical Medicine and Rehabilitation

## 2023-03-13 VITALS — BP 156/96 | HR 66

## 2023-03-13 DIAGNOSIS — M25562 Pain in left knee: Secondary | ICD-10-CM

## 2023-03-13 DIAGNOSIS — G8929 Other chronic pain: Secondary | ICD-10-CM

## 2023-03-13 DIAGNOSIS — M1712 Unilateral primary osteoarthritis, left knee: Secondary | ICD-10-CM

## 2023-03-13 NOTE — Patient Instructions (Signed)

## 2023-03-13 NOTE — Progress Notes (Signed)
Danielle Cook - 47 y.o. female MRN [id number]  Date of birth: [birth year]  Office Visit Note: Visit Date: 03/13/2023 PCP: Etta Grandchild, MD Referred by: Etta Grandchild, MD  Subjective: Chief Complaint  Patient presents with   Left Knee - Pain   HPI:  Danielle Cook is a 47 y.o. female who comes in today at the request of Danielle Goodie, FNP and Danielle Cai, PA-C for planned Left  Genicular nerve block block with fluoroscopic guidance.  The patient has failed conservative care including injections, home exercise, medications, time and activity modification.  Nerve block will be performed in a double block paradigm with goal towards radiofrequency ablation of the same genicular nerves for longer term definitive care.  Please see requesting physician notes for further details and justification.    ROS Otherwise per HPI.  Assessment & Plan: Visit Diagnoses:    ICD-10-CM   1. Chronic pain of left knee  M25.562 XR C-ARM NO REPORT   G89.29 Large Joint Inj: L knee    2. Unilateral primary osteoarthritis, left knee  M17.12 XR C-ARM NO REPORT    Large Joint Inj: L knee      Plan: No additional findings.   Meds & Orders: No orders of the defined types were placed in this encounter.   Orders Placed This Encounter  Procedures   Large Joint Inj: L knee   XR C-ARM NO REPORT    Follow-up: Return for Review Pain Diary.   Procedures: Large Joint Inj: L knee on 03/13/2023 8:34 AM Indications: pain and diagnostic evaluation Details: 25 G 3.5 in needle, fluoroscopy-guided anterior approach Medications: 5 mL bupivacaine 0.5 % Outcome: tolerated well, no immediate complications  Genicular Nerve Blockade with Fluoroscopic Guidance  Patient: Danielle Cook      Date of Birth: 03-29-1976 MRN: [id number] PCP: Etta Grandchild, MD      Visit Date: @ENCDATE @   Universal Protocol:    Date/Time: 05/13/248:34 AM  Consent Given By: the patient  Position: SUPINE  Additional  Comments: Vital signs were monitored before and after the procedure. Patient was prepped and draped in the usual sterile fashion. The correct patient, procedure, and site was verified.   Injection Procedure Details:  Procedure Site One Meds Administered: No orders of the defined types were placed in this encounter.    Laterality: Left  Location/Site:  Superior medial, superior lateral and inferior medial genicular nerve  Needle size: 3.5 inch 25-gauge spinal needle  Needle Placement: Just posterior to midline on the lateral view at the locations noted in the procedure note.   Findings:    -Comments: Excellent position of the needle tip using biplanar imaging with low contrast showing no vascular flow.  Procedure Details: The fluoroscope was positioned in a 90 degree anteroposterior (AP) fluoroscopic view and then tilted to obtain a true AP view of the knee joint.  The target areas are the mid-point of the curve formed where the femoral shaft and epicondyles meet as well as the midpoint of the curve on the medial side where the tibial shaft meets the plateau.  Using fluoroscopic guidance the target areas were infiltrated using a 27-gauge needle and 1-2 mL of 1% lidocaine without epinephrine to provide local anesthetic.  Then, for each target area a 3-1/2 inch 25-gauge spinal needle was driven down under intermittent fluoroscopic guidance to the periosteum of the target site.  Once all 3 needles were in place in the AP view, a lateral view was  obtained at each needle was driven control to the midpoint of the shafts respectively.  A spot film was obtained of the lateral view and saved.  Next, an AP view was obtained once again to confirm placement.  A spot film then was obtained and saved.  After fluoroscopic imaging was used to determine and confirm placement, 0.5-1 mL of injectate was delivered at each target location.  The patient was given a pain diary to complete and return to the  office.      Additional Comments:  The patient tolerated the procedure well Dressing: Band-Aid    Post-procedure details: Patient was observed during the procedure. Post-procedure instructions were reviewed.  Patient left the clinic in stable condition.         Consent was given by the patient. Immediately prior to procedure a time out was called to verify the correct patient, procedure, equipment, support staff and site/side marked as required. Patient was prepped and draped in the usual sterile fashion.          Clinical History: 1-2 Views of Left Knee 08/26/2022  AP lateral radiographs left knee reviewed.  Medial joint space narrowing  is present compared to prior radiographs as well as compared to the right knee.  No acute fracture.  No bone tunnel widening.  Lateral compartment and patellofemoral compartments appear intact.  Alignment intact.     Objective:  VS:  HT:    WT:   BMI:     BP:(!) 156/96  HR:66bpm  TEMP: ( )  RESP:  Physical Exam   Imaging: No results found.

## 2023-03-13 NOTE — Progress Notes (Signed)
Functional Pain Scale - descriptive words and definitions  Uncomfortable (3)  Pain is present but can complete all ADL's/sleep is slightly affected and passive distraction only gives marginal relief. Mild range order  Average Pain 6   +Driver, -BT, -Dye Allergies.  Left knee pain

## 2023-03-14 ENCOUNTER — Other Ambulatory Visit: Payer: Self-pay | Admitting: Physical Medicine and Rehabilitation

## 2023-03-14 DIAGNOSIS — M1712 Unilateral primary osteoarthritis, left knee: Secondary | ICD-10-CM

## 2023-03-14 DIAGNOSIS — G8929 Other chronic pain: Secondary | ICD-10-CM

## 2023-03-14 NOTE — Progress Notes (Signed)
Per pain diary, patient reports 100% relief of pain after first set of genicular nerve blocks.  We will proceed with second set of blocks.

## 2023-03-21 MED ORDER — BUPIVACAINE HCL 0.5 % IJ SOLN
5.0000 mL | INTRAMUSCULAR | Status: AC | PRN
  Administered 2023-03-13: 5 mL via INTRA_ARTICULAR

## 2023-04-05 ENCOUNTER — Ambulatory Visit: Payer: Commercial Managed Care - PPO | Admitting: Physical Medicine and Rehabilitation

## 2023-04-05 ENCOUNTER — Other Ambulatory Visit: Payer: Self-pay

## 2023-04-05 ENCOUNTER — Encounter: Payer: Self-pay | Admitting: Physical Medicine and Rehabilitation

## 2023-04-05 VITALS — BP 138/90 | HR 74

## 2023-04-05 DIAGNOSIS — M25562 Pain in left knee: Secondary | ICD-10-CM

## 2023-04-05 DIAGNOSIS — G8929 Other chronic pain: Secondary | ICD-10-CM

## 2023-04-05 NOTE — Progress Notes (Signed)
Functional Pain Scale - descriptive words and definitions  Distracting (5)    Aware of pain/able to complete some ADL's but limited by pain/sleep is affected and active distractions are only slightly useful. Moderate range order  Average Pain  varies   +Driver, -BT, -Dye Allergies.  Left knee pain

## 2023-04-06 ENCOUNTER — Other Ambulatory Visit: Payer: Self-pay | Admitting: Physical Medicine and Rehabilitation

## 2023-04-06 DIAGNOSIS — G8929 Other chronic pain: Secondary | ICD-10-CM

## 2023-04-06 DIAGNOSIS — M1712 Unilateral primary osteoarthritis, left knee: Secondary | ICD-10-CM

## 2023-04-06 MED ORDER — DIAZEPAM 5 MG PO TABS: ORAL_TABLET | ORAL | 0 refills | Status: DC

## 2023-04-06 NOTE — Progress Notes (Signed)
Per patient, 100% relief of pain with second left genicular nerve block. We will proceed with radiofrequency ablation. I will also call in pre-procedure Valium.

## 2023-04-11 MED ORDER — BUPIVACAINE HCL 0.5 % IJ SOLN
5.0000 mL | INTRAMUSCULAR | Status: AC | PRN
  Administered 2023-04-05: 5 mL via INTRA_ARTICULAR

## 2023-04-11 NOTE — Progress Notes (Signed)
Danielle Cook - 47 y.o. female MRN [id number]  Date of birth: 09-17-1976  Office Visit Note: Visit Date: 04/05/2023 PCP: Etta Grandchild, MD Referred by: Etta Grandchild, MD  Subjective: Chief Complaint  Patient presents with   Left Knee - Pain   HPI:  ISYSS ESPINAL is a 48 y.o. female who comes in today at the request of Ellin Goodie, FNP for planned Left  Genicular nerve block block with fluoroscopic guidance.  The patient has failed conservative care including injections, home exercise, medications, time and activity modification.  Nerve block will be performed in a double block paradigm with goal towards radiofrequency ablation of the same genicular nerves for longer term definitive care.  This is the second block and she did have more than 80% relief with the first.  Please see requesting physician notes for further details and justification.     ROS Otherwise per HPI.  Assessment & Plan: Visit Diagnoses:    ICD-10-CM   1. Chronic pain of left knee  M25.562 XR C-ARM NO REPORT   G89.29       Plan: No additional findings.   Meds & Orders: No orders of the defined types were placed in this encounter.   Orders Placed This Encounter  Procedures   Large Joint Inj   XR C-ARM NO REPORT    Follow-up: Return for Review Pain Diary.   Procedures: Large Joint Inj: L knee on 04/05/2023 3:00 PM Indications: pain and diagnostic evaluation Details: 25 G 3.5 in needle, fluoroscopy-guided anterior approach Medications: 5 mL bupivacaine 0.5 % Outcome: tolerated well, no immediate complications  Genicular Nerve Blockade with Fluoroscopic Guidance  Patient: Danielle Cook      Date of Birth: 06-23-1976 MRN: [id number] PCP: Etta Grandchild, MD      Visit Date: @ENCDATE @   Universal Protocol:    Date/Time: 04/10/2409:49 AM  Consent Given By: the patient  Position: SUPINE  Additional Comments: Vital signs were monitored before and after the procedure. Patient was  prepped and draped in the usual sterile fashion. The correct patient, procedure, and site was verified.   Injection Procedure Details:  Procedure Site One Meds Administered: No orders of the defined types were placed in this encounter.    Laterality: Left  Location/Site:  Superior medial, superior lateral and inferior medial genicular nerve  Needle size: 3.5 inch 25-gauge spinal needle  Needle Placement: Just posterior to midline on the lateral view at the locations noted in the procedure note.   Findings:    -Comments: Excellent position of the needle tip using biplanar imaging with low contrast showing no vascular flow.  Procedure Details: The fluoroscope was positioned in a 90 degree anteroposterior (AP) fluoroscopic view and then tilted to obtain a true AP view of the knee joint.  The target areas are the mid-point of the curve formed where the femoral shaft and epicondyles meet as well as the midpoint of the curve on the medial side where the tibial shaft meets the plateau.  Using fluoroscopic guidance the target areas were infiltrated using a 27-gauge needle and 1-2 mL of 1% lidocaine without epinephrine to provide local anesthetic.  Then, for each target area a 3-1/2 inch 25-gauge spinal needle was driven down under intermittent fluoroscopic guidance to the periosteum of the target site.  Once all 3 needles were in place in the AP view, a lateral view was obtained at each needle was driven control to the midpoint of the shafts respectively.  A spot film was obtained of the lateral view and saved.  Next, an AP view was obtained once again to confirm placement.  A spot film then was obtained and saved.  After fluoroscopic imaging was used to determine and confirm placement, 0.5-1 mL of injectate was delivered at each target location.  The patient was given a pain diary to complete and return to the office.      Additional Comments:  The patient tolerated the procedure  well Dressing: Band-Aid    Post-procedure details: Patient was observed during the procedure. Post-procedure instructions were reviewed.  Patient left the clinic in stable condition.         Consent was given by the patient. Immediately prior to procedure a time out was called to verify the correct patient, procedure, equipment, support staff and site/side marked as required. Patient was prepped and draped in the usual sterile fashion.          Clinical History: 1-2 Views of Left Knee 08/26/2022  AP lateral radiographs left knee reviewed.  Medial joint space narrowing  is present compared to prior radiographs as well as compared to the right knee.  No acute fracture.  No bone tunnel widening.  Lateral compartment and patellofemoral compartments appear intact.  Alignment intact.     Objective:  VS:  HT:    WT:   BMI:     BP:(!) 138/90  HR:74bpm  TEMP: ( )  RESP:  Physical Exam   Imaging: No results found.

## 2023-04-20 ENCOUNTER — Other Ambulatory Visit: Payer: Self-pay | Admitting: Internal Medicine

## 2023-04-20 DIAGNOSIS — I10 Essential (primary) hypertension: Secondary | ICD-10-CM

## 2023-04-25 ENCOUNTER — Ambulatory Visit: Payer: Commercial Managed Care - PPO | Admitting: Physical Medicine and Rehabilitation

## 2023-04-25 ENCOUNTER — Other Ambulatory Visit: Payer: Self-pay

## 2023-04-25 VITALS — BP 121/81 | HR 75

## 2023-04-25 DIAGNOSIS — M1712 Unilateral primary osteoarthritis, left knee: Secondary | ICD-10-CM | POA: Diagnosis not present

## 2023-04-25 DIAGNOSIS — M25562 Pain in left knee: Secondary | ICD-10-CM | POA: Diagnosis not present

## 2023-04-25 DIAGNOSIS — G8929 Other chronic pain: Secondary | ICD-10-CM

## 2023-04-25 MED ORDER — METHYLPREDNISOLONE ACETATE 80 MG/ML IJ SUSP
80.0000 mg | Freq: Once | INTRAMUSCULAR | Status: AC
Start: 2023-04-25 — End: 2023-04-25
  Administered 2023-04-25: 80 mg

## 2023-04-25 NOTE — Patient Instructions (Signed)

## 2023-04-25 NOTE — Progress Notes (Signed)
Functional Pain Scale - descriptive words and definitions  Distracting (5)    Aware of pain/able to complete some ADL's but limited by pain/sleep is affected and active distractions are only slightly useful. Moderate range order  Average Pain 4   +Driver, -BT, -Dye Allergies.  Left knee pain

## 2023-04-28 NOTE — Procedures (Signed)
Genicular Nerve Neurotomy with Fluoroscopic Guidance  Patient: Danielle Cook      Date of Birth: 10-09-1976 MRN: [id number] PCP: Etta Grandchild, MD      Visit Date: 04/25/2023   Universal Protocol:    Date/Time: 06/28/245:39 AM  Consent Given By: the patient  Position: SUPINE  Additional Comments: Vital signs were monitored before and after the procedure. Patient was prepped and draped in the usual sterile fashion. The correct patient, procedure, and site was verified.  Injection Procedure Details:   Procedure diagnoses: Chronic pain of left knee [M25.562, G89.29]    Meds Administered:  Meds ordered this encounter  Medications   methylPREDNISolone acetate (DEPO-MEDROL) injection 80 mg    Laterality: Left  Location/Site:  Knee: respective superior medial, superior lateral and inferior medial genicular nerves  Needle size: 18g, 10mm active tip  Needle type: Cosman/[employer] RF Cannula  Findings:   -Comments: Biplanar orthogonal views demonstrated excellent placement of the cannulas along the genicular nerve paths.  Procedure Details: The fluoroscope was positioned in a 90 degree anteroposterior (AP) fluoroscopic view and then tilted to obtain a true AP view of the knee joint.  The target areas for the superior medial, superior lateral and inferior medial genicular nerves are the mid-point of the curve formed where the femoral shaft and epicondyles meet as well as the midpoint of the curve on the medial side where the tibial shaft meets the plateau.  Using fluoroscopic guidance the target areas were infiltrated using a 27-gauge needle and 1-2 mL of 1% lidocaine without epinephrine to provide local anesthetic.  Then, for each target area a radiofrequency cannula was driven down under intermittent fluoroscopic guidance to the periosteum of the target site.  Once all 3 needles were in place in the AP view, a lateral view was obtained and each needle was driven under  control to the midpoint of the shafts respectively.  A spot film was obtained of the lateral view and saved.  Next, an AP view was obtained once again to confirm placement.  A spot film then was obtained and saved.  After fluoroscopic imaging was used to determine and confirm placement, motor stimulation was provided to confirm that there was no inadvertent motor stimulation.  Subsequently, each target area was infiltrated with 1-2 mL of the anesthetizing solution documented above.  After 2 minutes to allow for good anesthesia, a 90 second lesion was performed with the temperature maintained at 80C.  This was repeated for each target area.   Additional Comments:  No complications occurred Dressing: band-aid    Post-procedure details: Patient was observed during the procedure. Post-procedure instructions were reviewed. Follow-Up Instructions:  Patient left the clinic in stable condition.

## 2023-04-28 NOTE — Progress Notes (Signed)
[name] - 47 y.o. Cook MRN [id number]  Date of birth: Mar 17, 1976  Office Visit Note: Visit Date: 04/25/2023 PCP: Etta Grandchild, MD Referred by: Etta Grandchild, MD  Subjective: Chief Complaint  Patient presents with   Left Knee - Pain   HPI:  Danielle Cook is a 47 y.o. Cook who comes in today at the request of Ellin Goodie, FNP and Karenann Cai, PA-C for planned Left  Genicular nerve radiofrequency ablation.  The patient has failed conservative care including injections, home exercise, medications, time and activity modification.  She has had genicular nerve block performed and a double block paradigm with greater than 80% relief during the anesthetic phase.  Please see requesting physician notes for further details and justification.     ROS Otherwise per HPI.  Assessment & Plan: Visit Diagnoses:    ICD-10-CM   1. Chronic pain of left knee  M25.562 XR C-ARM NO REPORT   G89.29 Nerve Block    methylPREDNISolone acetate (DEPO-MEDROL) injection 80 mg    2. Unilateral primary osteoarthritis, left knee  M17.12 XR C-ARM NO REPORT    Nerve Block    methylPREDNISolone acetate (DEPO-MEDROL) injection 80 mg      Plan: No additional findings.   Meds & Orders:  Meds ordered this encounter  Medications   methylPREDNISolone acetate (DEPO-MEDROL) injection 80 mg    Orders Placed This Encounter  Procedures   Nerve Block   XR C-ARM NO REPORT    Follow-up: Return if symptoms worsen or fail to improve.   Procedures: No procedures performed  Genicular Nerve Neurotomy with Fluoroscopic Guidance  Patient: Danielle Cook      Date of Birth: February 05, 1976 MRN: [id number] PCP: Etta Grandchild, MD      Visit Date: 04/25/2023   Universal Protocol:    Date/Time: 06/28/245:39 AM  Consent Given By: the patient  Position: SUPINE  Additional Comments: Vital signs were monitored before and after the procedure. Patient was prepped and draped in the usual sterile  fashion. The correct patient, procedure, and site was verified.  Injection Procedure Details:   Procedure diagnoses: Chronic pain of left knee [M25.562, G89.29]    Meds Administered:  Meds ordered this encounter  Medications   methylPREDNISolone acetate (DEPO-MEDROL) injection 80 mg    Laterality: Left  Location/Site:  Knee: respective superior medial, superior lateral and inferior medial genicular nerves  Needle size: 18g, 10mm active tip  Needle type: Cosman/[employer] RF Cannula  Findings:   -Comments: Biplanar orthogonal views demonstrated excellent placement of the cannulas along the genicular nerve paths.  Procedure Details: The fluoroscope was positioned in a 90 degree anteroposterior (AP) fluoroscopic view and then tilted to obtain a true AP view of the knee joint.  The target areas for the superior medial, superior lateral and inferior medial genicular nerves are the mid-point of the curve formed where the femoral shaft and epicondyles meet as well as the midpoint of the curve on the medial side where the tibial shaft meets the plateau.  Using fluoroscopic guidance the target areas were infiltrated using a 27-gauge needle and 1-2 mL of 1% lidocaine without epinephrine to provide local anesthetic.  Then, for each target area a radiofrequency cannula was driven down under intermittent fluoroscopic guidance to the periosteum of the target site.  Once all 3 needles were in place in the AP view, a lateral view was obtained and each needle was driven under control to the midpoint of the shafts respectively.  A spot film was obtained of the lateral view and saved.  Next, an AP view was obtained once again to confirm placement.  A spot film then was obtained and saved.  After fluoroscopic imaging was used to determine and confirm placement, motor stimulation was provided to confirm that there was no inadvertent motor stimulation.  Subsequently, each target area was infiltrated  with 1-2 mL of the anesthetizing solution documented above.  After 2 minutes to allow for good anesthesia, a 90 second lesion was performed with the temperature maintained at 80C.  This was repeated for each target area.   Additional Comments:  No complications occurred Dressing: band-aid    Post-procedure details: Patient was observed during the procedure. Post-procedure instructions were reviewed. Follow-Up Instructions:  Patient left the clinic in stable condition.    Clinical History: 1-2 Views of Left Knee 08/26/2022  AP lateral radiographs left knee reviewed.  Medial joint space narrowing  is present compared to prior radiographs as well as compared to the right knee.  No acute fracture.  No bone tunnel widening.  Lateral compartment and patellofemoral compartments appear intact.  Alignment intact.     Objective:  VS:  HT:    WT:   BMI:     BP:121/81  HR:75bpm  TEMP: ( )  RESP:  Physical Exam Vitals and nursing note reviewed.  Constitutional:      General: She is not in acute distress.    Appearance: Normal appearance. She is well-developed. She is not ill-appearing.  HENT:     Head: Normocephalic and atraumatic.  Eyes:     Conjunctiva/sclera: Conjunctivae normal.     Pupils: Pupils are equal, round, and reactive to light.  Cardiovascular:     Rate and Rhythm: Normal rate.     Pulses: Normal pulses.  Pulmonary:     Effort: Pulmonary effort is normal.  Musculoskeletal:        General: Tenderness present.     Right lower leg: No edema.     Left lower leg: No edema.     Comments: Positive joint line tenderness with good varus and valgus stability.  Skin:    General: Skin is warm and dry.     Findings: No erythema or rash.  Neurological:     General: No focal deficit present.     Mental Status: She is alert and oriented to person, place, and time.     Sensory: No sensory deficit.     Motor: No abnormal muscle tone.     Coordination: Coordination normal.      Gait: Gait normal.  Psychiatric:        Mood and Affect: Mood normal.        Behavior: Behavior normal.      Imaging: No results found.

## 2023-06-01 ENCOUNTER — Encounter: Payer: Self-pay | Admitting: Internal Medicine

## 2023-06-01 ENCOUNTER — Ambulatory Visit: Payer: Commercial Managed Care - PPO | Admitting: Internal Medicine

## 2023-06-01 VITALS — BP 132/84 | HR 87 | Temp 98.2°F | Resp 16 | Ht 64.0 in | Wt 233.0 lb

## 2023-06-01 DIAGNOSIS — M7061 Trochanteric bursitis, right hip: Secondary | ICD-10-CM | POA: Diagnosis not present

## 2023-06-01 DIAGNOSIS — E785 Hyperlipidemia, unspecified: Secondary | ICD-10-CM | POA: Diagnosis not present

## 2023-06-01 DIAGNOSIS — E7841 Elevated Lipoprotein(a): Secondary | ICD-10-CM

## 2023-06-01 DIAGNOSIS — I10 Essential (primary) hypertension: Secondary | ICD-10-CM

## 2023-06-01 DIAGNOSIS — M545 Low back pain, unspecified: Secondary | ICD-10-CM

## 2023-06-01 DIAGNOSIS — M7062 Trochanteric bursitis, left hip: Secondary | ICD-10-CM

## 2023-06-01 DIAGNOSIS — Z8249 Family history of ischemic heart disease and other diseases of the circulatory system: Secondary | ICD-10-CM

## 2023-06-01 LAB — LIPID PANEL
Cholesterol: 239 mg/dL — ABNORMAL HIGH (ref 0–200)
HDL: 41.8 mg/dL (ref >39.00)
LDL Cholesterol: 168 mg/dL — ABNORMAL HIGH (ref 0–99)
NonHDL: 197.6
Total CHOL/HDL Ratio: 6
Triglycerides: 147 mg/dL (ref 0.0–149.0)
VLDL: 29.4 mg/dL (ref 0.0–40.0)

## 2023-06-01 LAB — BASIC METABOLIC PANEL
BUN: 12 mg/dL (ref 6–23)
CO2: 31 mEq/L (ref 19–32)
Calcium: 9.9 mg/dL (ref 8.4–10.5)
Chloride: 97 mEq/L (ref 96–112)
Creatinine, Ser: 0.7 mg/dL (ref 0.40–1.20)
GFR: 102.96 mL/min (ref >60.00)
Glucose, Bld: 122 mg/dL — ABNORMAL HIGH (ref 70–99)
Potassium: 3.5 mEq/L (ref 3.5–5.1)
Sodium: 137 mEq/L (ref 135–145)

## 2023-06-01 MED ORDER — MELOXICAM 15 MG PO TABS: 15.0000 mg | ORAL_TABLET | Freq: Every day | ORAL | 1 refills | Status: DC

## 2023-06-01 MED ORDER — INDAPAMIDE 1.25 MG PO TABS: 1.2500 mg | ORAL_TABLET | Freq: Every day | ORAL | 1 refills | Status: DC

## 2023-06-01 MED ORDER — OLMESARTAN MEDOXOMIL 20 MG PO TABS
20.0000 mg | ORAL_TABLET | Freq: Every day | ORAL | 1 refills | Status: DC
Start: 2023-06-01 — End: 2023-12-22

## 2023-06-01 NOTE — Progress Notes (Signed)
Subjective:  Patient ID: Danielle Cook, female    DOB: 01-23-1976  Age: 47 y.o. MRN: [id number]  CC: Hypertension   HPI Danielle Cook presents for f/up ----  Discussed the use of AI scribe software for clinical note transcription with the patient, who gave verbal consent to proceed.  History of Present Illness   The patient, with a history of chronic hypertension, reports improved blood pressure readings since starting Olmesartan in June, in addition to their ongoing indapamide therapy. They deny any symptoms of hypotension or hypertension but do note occasional episodes of dizziness.       Outpatient Medications Prior to Visit  Medication Sig Dispense Refill   augmented betamethasone dipropionate (DIPROLENE-AF) 0.05 % cream Apply 1 application topically 2 (two) times daily. 30 g 1   busPIRone (BUSPAR) 10 MG tablet Take 1 tablet (10 mg total) by mouth 2 (two) times daily. 180 tablet 1   clonazePAM (KLONOPIN) 0.5 MG tablet Take 1 tablet (0.5 mg total) by mouth 2 (two) times daily as needed for anxiety. 180 tablet 0   cyclobenzaprine (FLEXERIL) 5 MG tablet Take 1 tablet (5 mg total) by mouth 3 (three) times daily as needed. 40 tablet 1   DULoxetine (CYMBALTA) 60 MG capsule TAKE 1 CAPSULE BY MOUTH DAILY 90 capsule 1   levonorgestrel (MIRENA) 20 MCG/24HR IUD 1 Intra Uterine Device (1 each total) by Intrauterine route once. 1 each 0   Misc Natural Products (ELDERBERRY ZINC/VIT C/IMMUNE MT) Take 1 tablet by mouth daily.     diazepam (VALIUM) 5 MG tablet Take one tablet by mouth with food one hour prior to procedure. May repeat 30 minutes prior if needed. 2 tablet 0   indapamide (LOZOL) 1.25 MG tablet TAKE 1 TABLET BY MOUTH DAILY 90 tablet 0   meloxicam (MOBIC) 15 MG tablet TAKE 1 TABLET BY MOUTH DAILY 90 tablet 1   No facility-administered medications prior to visit.    ROS Review of Systems  Constitutional: Negative.  Negative for appetite change, fatigue and unexpected weight  change.  HENT: Negative.    Eyes: Negative.   Respiratory: Negative.  Negative for apnea, cough, shortness of breath and wheezing.   Cardiovascular:  Negative for chest pain, palpitations and leg swelling.  Gastrointestinal:  Negative for abdominal pain, constipation, diarrhea and vomiting.  Genitourinary: Negative.  Negative for difficulty urinating.  Musculoskeletal:  Positive for arthralgias and back pain. Negative for myalgias.  Skin: Negative.  Negative for color change and pallor.  Neurological:  Negative for dizziness.  Hematological:  Negative for adenopathy. Does not bruise/bleed easily.  Psychiatric/Behavioral:  Negative for decreased concentration, dysphoric mood and suicidal ideas. The patient is nervous/anxious.     Objective:  BP 132/84 (BP Location: Left Arm, Patient Position: Sitting, Cuff Size: Large)   Pulse 87   Temp 98.2 F (36.8 C) (Oral)   Resp 16   Ht 5\' 4"  (1.626 m)   Wt 233 lb (105.7 kg)   LMP 05/18/2023 (Approximate)   SpO2 96%   BMI 39.99 kg/m   BP Readings from Last 3 Encounters:  06/01/23 132/84  04/25/23 121/81  04/05/23 (!) 138/90    Wt Readings from Last 3 Encounters:  06/01/23 233 lb (105.7 kg)  12/01/22 235 lb (106.6 kg)  09/01/22 233 lb (105.7 kg)    Physical Exam Vitals reviewed.  Constitutional:      Appearance: Normal appearance.  HENT:     Nose: Nose normal.     Mouth/Throat:  Mouth: Mucous membranes are moist.  Eyes:     General: No scleral icterus.    Conjunctiva/sclera: Conjunctivae normal.  Cardiovascular:     Rate and Rhythm: Normal rate and regular rhythm.     Heart sounds: No murmur heard. Pulmonary:     Effort: Pulmonary effort is normal.     Breath sounds: No stridor. No wheezing, rhonchi or rales.  Abdominal:     General: Abdomen is flat.     Palpations: There is no mass.     Tenderness: There is no abdominal tenderness. There is no guarding.     Hernia: No hernia is present.  Musculoskeletal:         General: Normal range of motion.     Cervical back: Neck supple.     Right lower leg: No edema.     Left lower leg: No edema.  Lymphadenopathy:     Cervical: No cervical adenopathy.  Skin:    General: Skin is warm and dry.  Neurological:     General: No focal deficit present.     Mental Status: She is alert. Mental status is at baseline.  Psychiatric:        Mood and Affect: Mood normal.        Behavior: Behavior normal.     Lab Results  Component Value Date   WBC 7.8 12/01/2022   HGB 14.4 12/01/2022   HCT 42.4 12/01/2022   PLT 249.0 12/01/2022   GLUCOSE 122 (H) 06/01/2023   CHOL 239 (H) 06/01/2023   TRIG 147.0 06/01/2023   HDL 41.80 06/01/2023   LDLCALC 168 (H) 06/01/2023   ALT 24 12/01/2022   AST 23 12/01/2022   NA 137 06/01/2023   K 3.5 06/01/2023   CL 97 06/01/2023   CREATININE 0.70 06/01/2023   BUN 12 06/01/2023   CO2 31 06/01/2023   TSH 1.39 12/01/2022   HGBA1C 5.7 12/01/2022   MICROALBUR <0.7 08/24/2020    No results found.  Assessment & Plan:  Dyslipidemia, goal LDL below 130 -     Lipoprotein A (LPA); Future -     Lipid panel; Future -     Rosuvastatin Calcium; Take 1 tablet (20 mg total) by mouth daily.  Dispense: 90 tablet; Refill: 1 -     CT CARDIAC SCORING (SELF PAY ONLY); Future -     Aspirin; Take 1 tablet (81 mg total) by mouth daily. Swallow whole.  Dispense: 90 tablet; Refill: 1  Primary hypertension- Her blood pressure is adequately well-controlled. -     Basic metabolic panel; Future -     Indapamide; Take 1 tablet (1.25 mg total) by mouth daily.  Dispense: 90 tablet; Refill: 1 -     Olmesartan Medoxomil; Take 1 tablet (20 mg total) by mouth daily.  Dispense: 90 tablet; Refill: 1  Greater trochanteric bursitis of both hips -     Meloxicam; Take 1 tablet (15 mg total) by mouth daily.  Dispense: 90 tablet; Refill: 1  Low back pain, unspecified back pain laterality, unspecified chronicity, unspecified whether sciatica present -      Meloxicam; Take 1 tablet (15 mg total) by mouth daily.  Dispense: 90 tablet; Refill: 1  High serum lipoprotein(a)- Will evaluate for CAD with a CCS. -     Rosuvastatin Calcium; Take 1 tablet (20 mg total) by mouth daily.  Dispense: 90 tablet; Refill: 1 -     CT CARDIAC SCORING (SELF PAY ONLY); Future -     Aspirin; Take  1 tablet (81 mg total) by mouth daily. Swallow whole.  Dispense: 90 tablet; Refill: 1  Family history of premature CAD -     CT CARDIAC SCORING (SELF PAY ONLY); Future     Follow-up: Return in about 6 months (around 12/02/2023).  Sanda Linger, MD

## 2023-06-01 NOTE — Patient Instructions (Signed)
Hypertension, Adult High blood pressure (hypertension) is when the force of blood pumping through the arteries is too strong. The arteries are the blood vessels that carry blood from the heart throughout the body. Hypertension forces the heart to work harder to pump blood and may cause arteries to become narrow or stiff. Untreated or uncontrolled hypertension can lead to a heart attack, heart failure, a stroke, kidney disease, and other problems. A blood pressure reading consists of a higher number over a lower number. Ideally, your blood pressure should be below 120/80. The first ("top") number is called the systolic pressure. It is a measure of the pressure in your arteries as your heart beats. The second ("bottom") number is called the diastolic pressure. It is a measure of the pressure in your arteries as the heart relaxes. What are the causes? The exact cause of this condition is not known. There are some conditions that result in high blood pressure. What increases the risk? Certain factors may make you more likely to develop high blood pressure. Some of these risk factors are under your control, including: Smoking. Not getting enough exercise or physical activity. Being overweight. Having too much fat, sugar, calories, or salt (sodium) in your diet. Drinking too much alcohol. Other risk factors include: Having a personal history of heart disease, diabetes, high cholesterol, or kidney disease. Stress. Having a family history of high blood pressure and high cholesterol. Having obstructive sleep apnea. Age. The risk increases with age. What are the signs or symptoms? High blood pressure may not cause symptoms. Very high blood pressure (hypertensive crisis) may cause: Headache. Fast or irregular heartbeats (palpitations). Shortness of breath. Nosebleed. Nausea and vomiting. Vision changes. Severe chest pain, dizziness, and seizures. How is this diagnosed? This condition is diagnosed by  measuring your blood pressure while you are seated, with your arm resting on a flat surface, your legs uncrossed, and your feet flat on the floor. The cuff of the blood pressure monitor will be placed directly against the skin of your upper arm at the level of your heart. Blood pressure should be measured at least twice using the same arm. Certain conditions can cause a difference in blood pressure between your right and left arms. If you have a high blood pressure reading during one visit or you have normal blood pressure with other risk factors, you may be asked to: Return on a different day to have your blood pressure checked again. Monitor your blood pressure at home for 1 week or longer. If you are diagnosed with hypertension, you may have other blood or imaging tests to help your health care provider understand your overall risk for other conditions. How is this treated? This condition is treated by making healthy lifestyle changes, such as eating healthy foods, exercising more, and reducing your alcohol intake. You may be referred for counseling on a healthy diet and physical activity. Your health care provider may prescribe medicine if lifestyle changes are not enough to get your blood pressure under control and if: Your systolic blood pressure is above 130. Your diastolic blood pressure is above 80. Your personal target blood pressure may vary depending on your medical conditions, your age, and other factors. Follow these instructions at home: Eating and drinking  Eat a diet that is high in fiber and potassium, and low in sodium, added sugar, and fat. An example of this eating plan is called the DASH diet. DASH stands for Dietary Approaches to Stop Hypertension. To eat this way: Eat   plenty of fresh fruits and vegetables. Try to fill one half of your plate at each meal with fruits and vegetables. Eat whole grains, such as whole-wheat pasta, brown rice, or whole-grain bread. Fill about one  fourth of your plate with whole grains. Eat or drink low-fat dairy products, such as skim milk or low-fat yogurt. Avoid fatty cuts of meat, processed or cured meats, and poultry with skin. Fill about one fourth of your plate with lean proteins, such as fish, chicken without skin, beans, eggs, or tofu. Avoid pre-made and processed foods. These tend to be higher in sodium, added sugar, and fat. Reduce your daily sodium intake. Many people with hypertension should eat less than 1,500 mg of sodium a day. Do not drink alcohol if: Your health care provider tells you not to drink. You are pregnant, may be pregnant, or are planning to become pregnant. If you drink alcohol: Limit how much you have to: 0-1 drink a day for women. 0-2 drinks a day for men. Know how much alcohol is in your drink. In the [place] one drink equals one 12 oz bottle of beer (355 mL), one 5 oz glass of wine (148 mL), or one 1 oz glass of hard liquor (44 mL). Lifestyle  Work with your health care provider to maintain a healthy body weight or to lose weight. Ask what an ideal weight is for you. Get at least 30 minutes of exercise that causes your heart to beat faster (aerobic exercise) most days of the week. Activities may include walking, swimming, or biking. Include exercise to strengthen your muscles (resistance exercise), such as Pilates or lifting weights, as part of your weekly exercise routine. Try to do these types of exercises for 30 minutes at least 3 days a week. Do not use any products that contain nicotine or tobacco. These products include cigarettes, chewing tobacco, and vaping devices, such as e-cigarettes. If you need help quitting, ask your health care provider. Monitor your blood pressure at home as told by your health care provider. Keep all follow-up visits. This is important. Medicines Take over-the-counter and prescription medicines only as told by your health care provider. Follow directions carefully. Blood  pressure medicines must be taken as prescribed. Do not skip doses of blood pressure medicine. Doing this puts you at risk for problems and can make the medicine less effective. Ask your health care provider about side effects or reactions to medicines that you should watch for. Contact a health care provider if you: Think you are having a reaction to a medicine you are taking. Have headaches that keep coming back (recurring). Feel dizzy. Have swelling in your ankles. Have trouble with your vision. Get help right away if you: Develop a severe headache or confusion. Have unusual weakness or numbness. Feel faint. Have severe pain in your chest or abdomen. Vomit repeatedly. Have trouble breathing. These symptoms may be an emergency. Get help right away. Call 911. Do not wait to see if the symptoms will go away. Do not drive yourself to the hospital. Summary Hypertension is when the force of blood pumping through your arteries is too strong. If this condition is not controlled, it may put you at risk for serious complications. Your personal target blood pressure may vary depending on your medical conditions, your age, and other factors. For most people, a normal blood pressure is less than 120/80. Hypertension is treated with lifestyle changes, medicines, or a combination of both. Lifestyle changes include losing weight, eating a healthy,   low-sodium diet, exercising more, and limiting alcohol. This information is not intended to replace advice given to you by your health care provider. Make sure you discuss any questions you have with your health care provider. Document Revised: 08/24/2021 Document Reviewed: 08/24/2021 Elsevier Patient Education  2024 [employer]  

## 2023-06-04 DIAGNOSIS — E7841 Elevated Lipoprotein(a): Secondary | ICD-10-CM | POA: Insufficient documentation

## 2023-06-04 MED ORDER — ROSUVASTATIN CALCIUM 20 MG PO TABS: 20.0000 mg | ORAL_TABLET | Freq: Every day | ORAL | 1 refills | Status: DC

## 2023-06-04 MED ORDER — ASPIRIN 81 MG PO TBEC
81.0000 mg | DELAYED_RELEASE_TABLET | Freq: Every day | ORAL | 1 refills | Status: AC
Start: 2023-06-04 — End: ?

## 2023-07-04 ENCOUNTER — Encounter: Payer: Self-pay | Admitting: Internal Medicine

## 2023-07-04 ENCOUNTER — Other Ambulatory Visit: Payer: Self-pay | Admitting: Internal Medicine

## 2023-07-04 DIAGNOSIS — M545 Low back pain, unspecified: Secondary | ICD-10-CM

## 2023-07-04 DIAGNOSIS — F418 Other specified anxiety disorders: Secondary | ICD-10-CM

## 2023-07-04 MED ORDER — BUSPIRONE HCL 10 MG PO TABS
10.0000 mg | ORAL_TABLET | Freq: Two times a day (BID) | ORAL | 0 refills | Status: DC
Start: 2023-07-04 — End: 2023-07-04

## 2023-07-04 MED ORDER — DULOXETINE HCL 60 MG PO CPEP
60.0000 mg | ORAL_CAPSULE | Freq: Every day | ORAL | 0 refills | Status: DC
Start: 2023-07-04 — End: 2023-07-04

## 2023-07-06 ENCOUNTER — Other Ambulatory Visit: Payer: Self-pay | Admitting: Internal Medicine

## 2023-07-06 DIAGNOSIS — G8929 Other chronic pain: Secondary | ICD-10-CM

## 2023-07-06 DIAGNOSIS — F418 Other specified anxiety disorders: Secondary | ICD-10-CM

## 2023-08-16 ENCOUNTER — Encounter: Payer: Self-pay | Admitting: Internal Medicine

## 2023-09-14 ENCOUNTER — Other Ambulatory Visit: Payer: Self-pay | Admitting: Internal Medicine

## 2023-09-14 DIAGNOSIS — F418 Other specified anxiety disorders: Secondary | ICD-10-CM

## 2023-09-14 MED ORDER — BUSPIRONE HCL 10 MG PO TABS
10.0000 mg | ORAL_TABLET | Freq: Two times a day (BID) | ORAL | 0 refills | Status: DC
Start: 2023-09-14 — End: 2023-12-14

## 2023-09-18 ENCOUNTER — Other Ambulatory Visit: Payer: Self-pay

## 2023-09-18 ENCOUNTER — Encounter: Payer: Self-pay | Admitting: Internal Medicine

## 2023-09-18 DIAGNOSIS — F418 Other specified anxiety disorders: Secondary | ICD-10-CM

## 2023-10-09 ENCOUNTER — Encounter: Payer: Self-pay | Admitting: Internal Medicine

## 2023-10-16 ENCOUNTER — Other Ambulatory Visit: Payer: Self-pay | Admitting: Internal Medicine

## 2023-10-16 DIAGNOSIS — Z111 Encounter for screening for respiratory tuberculosis: Secondary | ICD-10-CM

## 2023-10-23 ENCOUNTER — Other Ambulatory Visit: Payer: Commercial Managed Care - PPO

## 2023-10-23 DIAGNOSIS — Z111 Encounter for screening for respiratory tuberculosis: Secondary | ICD-10-CM

## 2023-10-26 LAB — QUANTIFERON-TB GOLD PLUS
Mitogen-NIL: >10 [IU]/mL
NIL: 0.04 [IU]/mL
QuantiFERON-TB Gold Plus: NEGATIVE
TB1-NIL: 0 [IU]/mL
TB2-NIL: <0 [IU]/mL

## 2023-11-06 ENCOUNTER — Encounter: Payer: Self-pay | Admitting: Internal Medicine

## 2023-12-04 ENCOUNTER — Other Ambulatory Visit: Payer: Self-pay | Admitting: Internal Medicine

## 2023-12-04 DIAGNOSIS — M545 Low back pain, unspecified: Secondary | ICD-10-CM

## 2023-12-04 DIAGNOSIS — M7061 Trochanteric bursitis, right hip: Secondary | ICD-10-CM

## 2023-12-04 DIAGNOSIS — E7841 Elevated Lipoprotein(a): Secondary | ICD-10-CM

## 2023-12-04 DIAGNOSIS — F418 Other specified anxiety disorders: Secondary | ICD-10-CM

## 2023-12-04 DIAGNOSIS — E785 Hyperlipidemia, unspecified: Secondary | ICD-10-CM

## 2023-12-04 DIAGNOSIS — I10 Essential (primary) hypertension: Secondary | ICD-10-CM

## 2023-12-04 NOTE — Telephone Encounter (Signed)
Spoke with patient, she is scheduled for a visit. She is asking if she can receive refills to last until appt at least?

## 2023-12-14 ENCOUNTER — Encounter: Payer: Self-pay | Admitting: Internal Medicine

## 2023-12-14 ENCOUNTER — Ambulatory Visit (INDEPENDENT_AMBULATORY_CARE_PROVIDER_SITE_OTHER): Payer: Commercial Managed Care - PPO | Admitting: Internal Medicine

## 2023-12-14 DIAGNOSIS — M7061 Trochanteric bursitis, right hip: Secondary | ICD-10-CM

## 2023-12-14 DIAGNOSIS — R0681 Apnea, not elsewhere classified: Secondary | ICD-10-CM

## 2023-12-14 DIAGNOSIS — E785 Hyperlipidemia, unspecified: Secondary | ICD-10-CM

## 2023-12-14 DIAGNOSIS — Z6841 Body Mass Index (BMI) 40.0 and over, adult: Secondary | ICD-10-CM

## 2023-12-14 DIAGNOSIS — Z0000 Encounter for general adult medical examination without abnormal findings: Secondary | ICD-10-CM

## 2023-12-14 DIAGNOSIS — F411 Generalized anxiety disorder: Secondary | ICD-10-CM | POA: Diagnosis not present

## 2023-12-14 DIAGNOSIS — G8929 Other chronic pain: Secondary | ICD-10-CM

## 2023-12-14 DIAGNOSIS — I950 Idiopathic hypotension: Secondary | ICD-10-CM | POA: Diagnosis not present

## 2023-12-14 DIAGNOSIS — M7062 Trochanteric bursitis, left hip: Secondary | ICD-10-CM

## 2023-12-14 DIAGNOSIS — M545 Low back pain, unspecified: Secondary | ICD-10-CM

## 2023-12-14 DIAGNOSIS — I10 Essential (primary) hypertension: Secondary | ICD-10-CM | POA: Diagnosis not present

## 2023-12-14 DIAGNOSIS — E7841 Elevated Lipoprotein(a): Secondary | ICD-10-CM | POA: Diagnosis not present

## 2023-12-14 DIAGNOSIS — Z0001 Encounter for general adult medical examination with abnormal findings: Secondary | ICD-10-CM

## 2023-12-14 DIAGNOSIS — F418 Other specified anxiety disorders: Secondary | ICD-10-CM

## 2023-12-14 HISTORY — DX: Apnea, not elsewhere classified: R06.81

## 2023-12-14 LAB — LIPID PANEL
Cholesterol: 242 mg/dL — ABNORMAL HIGH (ref 0–200)
HDL: 42.9 mg/dL (ref >39.00)
LDL Cholesterol: 160 mg/dL — ABNORMAL HIGH (ref 0–99)
NonHDL: 199.04
Total CHOL/HDL Ratio: 6
Triglycerides: 197 mg/dL — ABNORMAL HIGH (ref 0.0–149.0)
VLDL: 39.4 mg/dL (ref 0.0–40.0)

## 2023-12-14 LAB — URINALYSIS, ROUTINE W REFLEX MICROSCOPIC
Bilirubin Urine: NEGATIVE
Hgb urine dipstick: NEGATIVE
Ketones, ur: NEGATIVE
Leukocytes,Ua: NEGATIVE
Nitrite: NEGATIVE
RBC / HPF: NONE SEEN (ref 0–?)
Specific Gravity, Urine: 1.025 (ref 1.000–1.030)
Total Protein, Urine: NEGATIVE
Urine Glucose: NEGATIVE
Urobilinogen, UA: 0.2 (ref 0.0–1.0)
pH: 6 (ref 5.0–8.0)

## 2023-12-14 LAB — CBC WITH DIFFERENTIAL/PLATELET
Basophils Absolute: 0.1 10*3/uL (ref 0.0–0.1)
Basophils Relative: 0.8 % (ref 0.0–3.0)
Eosinophils Absolute: 0.2 10*3/uL (ref 0.0–0.7)
Eosinophils Relative: 2.5 % (ref 0.0–5.0)
HCT: 44.8 % (ref 36.0–46.0)
Hemoglobin: 14.8 g/dL (ref 12.0–15.0)
Lymphocytes Relative: 25.9 % (ref 12.0–46.0)
Lymphs Abs: 1.9 10*3/uL (ref 0.7–4.0)
MCHC: 33.1 g/dL (ref 30.0–36.0)
MCV: 89.4 fL (ref 78.0–100.0)
Monocytes Absolute: 0.6 10*3/uL (ref 0.1–1.0)
Monocytes Relative: 8.6 % (ref 3.0–12.0)
Neutro Abs: 4.6 10*3/uL (ref 1.4–7.7)
Neutrophils Relative %: 62.2 % (ref 43.0–77.0)
Platelets: 295 10*3/uL (ref 150.0–400.0)
RBC: 5.01 Mil/uL (ref 3.87–5.11)
RDW: 14.2 % (ref 11.5–15.5)
WBC: 7.4 10*3/uL (ref 4.0–10.5)

## 2023-12-14 LAB — BASIC METABOLIC PANEL
BUN: 11 mg/dL (ref 6–23)
CO2: 32 meq/L (ref 19–32)
Calcium: 9.4 mg/dL (ref 8.4–10.5)
Chloride: 97 meq/L (ref 96–112)
Creatinine, Ser: 0.64 mg/dL (ref 0.40–1.20)
GFR: 104.81 mL/min (ref >60.00)
Glucose, Bld: 133 mg/dL — ABNORMAL HIGH (ref 70–99)
Potassium: 3.2 meq/L — ABNORMAL LOW (ref 3.5–5.1)
Sodium: 137 meq/L (ref 135–145)

## 2023-12-14 LAB — HEPATIC FUNCTION PANEL
ALT: 23 U/L (ref 0–35)
AST: 23 U/L (ref 0–37)
Albumin: 4.4 g/dL (ref 3.5–5.2)
Alkaline Phosphatase: 60 U/L (ref 39–117)
Bilirubin, Direct: 0.1 mg/dL (ref 0.0–0.3)
Total Bilirubin: 0.8 mg/dL (ref 0.2–1.2)
Total Protein: 7.2 g/dL (ref 6.0–8.3)

## 2023-12-14 LAB — CK: Total CK: 156 U/L (ref 7–177)

## 2023-12-14 LAB — HEMOGLOBIN A1C: Hgb A1c MFr Bld: 6.1 % (ref 4.6–6.5)

## 2023-12-14 LAB — CORTISOL: Cortisol, Plasma: 10.1 ug/dL

## 2023-12-14 LAB — TSH: TSH: 1.74 u[IU]/mL (ref 0.35–5.50)

## 2023-12-14 MED ORDER — DULOXETINE HCL 60 MG PO CPEP: 60.0000 mg | ORAL_CAPSULE | Freq: Every day | ORAL | 1 refills | Status: DC

## 2023-12-14 MED ORDER — BUSPIRONE HCL 10 MG PO TABS: 10.0000 mg | ORAL_TABLET | Freq: Two times a day (BID) | ORAL | 0 refills | Status: DC

## 2023-12-14 MED ORDER — CYCLOBENZAPRINE HCL 5 MG PO TABS: 5.0000 mg | ORAL_TABLET | Freq: Three times a day (TID) | ORAL | 1 refills | Status: DC | PRN

## 2023-12-14 MED ORDER — ROSUVASTATIN CALCIUM 20 MG PO TABS
20.0000 mg | ORAL_TABLET | Freq: Every day | ORAL | 2 refills | Status: DC
Start: 2023-12-14 — End: 2023-12-16

## 2023-12-14 MED ORDER — CLONAZEPAM 0.5 MG PO TABS: 0.5000 mg | ORAL_TABLET | Freq: Two times a day (BID) | ORAL | 0 refills | Status: DC | PRN

## 2023-12-14 NOTE — Patient Instructions (Signed)

## 2023-12-14 NOTE — Progress Notes (Unsigned)
Subjective:  Patient ID: Danielle Cook, female    DOB: [birth year]  Age: 48 y.o. MRN: [id number]  CC: Annual Exam, Hypertension, and Hyperlipidemia   HPI Danielle Cook presents for a CPX and f/up ---   Discussed the use of AI scribe software for clinical note transcription with the patient, who gave verbal consent to proceed.  History of Present Illness   Danielle Cook is a 48 year old female who presents with low blood pressure.  She has been experiencing low blood pressure with recent readings as low as 90/50 mmHg. 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 Her blood pressure was 102/68 mmHg after taking her medication and before having coffee. No chest pain, shortness of breath, dizziness, or lightheadedness.  She is currently taking duloxetine for pain and occasionally uses ibuprofen. She was previously taking meloxicam but has not used it in almost two weeks. She has stopped taking Crestor due to memory issues and is trying citrus bergamot instead. No muscle or joint aches from Crestor, which she is not currently taking.  No swelling in her legs or feet.  Her family history is significant for heart disease; her father died of a heart attack at age 70, and her mother has had strokes.  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 She reports improved mental health since leaving her hospital job. She has an IUD and experiences irregular menstrual cycles, with the last cycle ending two days ago. She has not had a recent mammogram and plans to schedule one with her OB GYN.       Outpatient Medications Prior to Visit  Medication Sig Dispense Refill   aspirin EC 81 MG tablet Take 1 tablet (81 mg total) by mouth daily. Swallow whole. 90 tablet 1   augmented betamethasone dipropionate  (DIPROLENE-AF) 0.05 % cream Apply 1 application topically 2 (two) times daily. 30 g 1   indapamide (LOZOL) 1.25 MG tablet Take 1 tablet (1.25 mg total) by mouth daily. 90 tablet 1   levonorgestrel (MIRENA) 20 MCG/24HR IUD 1 Intra Uterine Device (1 each total) by Intrauterine route once. 1 each 0   meloxicam (MOBIC) 15 MG tablet Take 1 tablet (15 mg total) by mouth daily. 90 tablet 1   Misc Natural Products (ELDERBERRY ZINC/VIT C/IMMUNE MT) Take 1 tablet by mouth daily.     olmesartan (BENICAR) 20 MG tablet Take 1 tablet (20 mg total) by mouth daily. 90 tablet 1   busPIRone (BUSPAR) 10 MG tablet Take 1 tablet (10 mg total) by mouth 2 (two) times daily. 180 tablet 0   clonazePAM (KLONOPIN) 0.5 MG tablet Take 1 tablet (0.5 mg total) by mouth 2 (two) times daily as needed for anxiety. 180 tablet 0   cyclobenzaprine (FLEXERIL) 5 MG tablet Take 1 tablet (5 mg total) by mouth 3 (three) times daily as needed. 40 tablet 1   DULoxetine (CYMBALTA) 60 MG capsule TAKE 1 CAPSULE BY MOUTH DAILY 90 capsule 1   rosuvastatin (CRESTOR) 20 MG tablet Take 1 tablet (20 mg total) by mouth daily. 90 tablet 1   No facility-administered medications prior to visit.    ROS Review of Systems  Constitutional:  Positive for unexpected weight change (wt gain). Negative for appetite change, chills, diaphoresis, fatigue and fever.  HENT: Negative.    Respiratory:  Positive for apnea. Negative for cough, chest tightness, shortness of breath and wheezing.        Her husband has witnessed apneic episodes   Cardiovascular:  Negative for chest pain, palpitations and leg swelling.  Gastrointestinal:  Negative for abdominal pain, constipation, diarrhea, nausea and vomiting.  Genitourinary: Negative.  Negative for difficulty urinating and dysuria.  Musculoskeletal:  Positive for arthralgias and back pain. Negative for gait problem and myalgias.  Skin: Negative.  Negative for color change and pallor.  Neurological: Negative.   Negative for dizziness, weakness and light-headedness.  Hematological:  Negative for adenopathy. Does not bruise/bleed easily.  Psychiatric/Behavioral:  Negative for decreased concentration, dysphoric mood, sleep disturbance and suicidal ideas. The patient is nervous/anxious.     Objective:  BP 110/62 (BP Location: Left Arm, Patient Position: Sitting, Cuff Size: Normal) Comment: BP (R) 102/68 (L) 100/62  Pulse 74   Temp 98 F (36.7 C) (Temporal)   Resp 16   Ht 5\' 4"  (1.626 m)   Wt 241 lb 6 oz (109.5 kg)   LMP 12/12/2023 (Exact Date)   SpO2 97%   BMI 41.43 kg/m   BP Readings from Last 3 Encounters:  12/14/23 110/62  06/01/23 132/84  04/25/23 121/81    Wt Readings from Last 3 Encounters:  12/14/23 241 lb 6 oz (109.5 kg)  06/01/23 233 lb (105.7 kg)  12/01/22 235 lb (106.6 kg)    Physical Exam Vitals reviewed.  Constitutional:      Appearance: She is obese.  HENT:     Nose: Nose normal.     Mouth/Throat:     Mouth: Mucous membranes are moist.  Eyes:     General: No scleral icterus.    Conjunctiva/sclera: Conjunctivae normal.  Cardiovascular:     Rate and Rhythm: Normal rate and regular rhythm.     Pulses: Normal pulses.     Heart sounds: No murmur heard.    No friction rub. No gallop.     Comments: EKG ----  NSR, 65 bpm No LVH, Q waves, or ST/T wave changes  Pulmonary:     Effort: Pulmonary effort is normal.     Breath sounds: No stridor. No wheezing, rhonchi or rales.  Abdominal:     General: Abdomen is flat.     Palpations: There is no mass.     Tenderness: There is no abdominal tenderness. There is no guarding.     Hernia: No hernia is present.  Musculoskeletal:     Cervical back: Neck supple.     Right lower leg: No edema.     Left lower leg: No edema.  Lymphadenopathy:     Cervical: No cervical adenopathy.  Skin:    General: Skin is warm and dry.  Neurological:     General: No focal deficit present.     Mental Status: She is alert. Mental status is  at baseline.  Psychiatric:        Mood and Affect: Mood normal.        Behavior: Behavior normal.     Lab Results  Component Value Date   WBC 7.4 12/14/2023   HGB 14.8 12/14/2023   HCT 44.8 12/14/2023   PLT 295.0 12/14/2023   GLUCOSE 133 (H) 12/14/2023   CHOL 242 (H) 12/14/2023   TRIG 197.0 (H) 12/14/2023   HDL 42.90 12/14/2023   LDLCALC 160 (H) 12/14/2023   ALT 23 12/14/2023   AST 23 12/14/2023   NA 137 12/14/2023   K 3.2 (L) 12/14/2023  CL 97 12/14/2023   CREATININE 0.64 12/14/2023   BUN 11 12/14/2023   CO2 32 12/14/2023   TSH 1.74 12/14/2023   HGBA1C 6.1 12/14/2023   MICROALBUR <0.7 08/24/2020    No results found.  Assessment & Plan:   Morbid obesity (HCC)- She has prediabetes. -     Urinalysis, Routine w reflex microscopic; Future -     Hepatic function panel; Future -     Hemoglobin A1c; Future  Dyslipidemia, goal LDL below 130- Will risk stratify with a coronary calcium score.  -     Lipid panel; Future -     Hepatic function panel; Future -     CK; Future -     CT CARDIAC SCORING ([facility] LOCATIONS ONLY); Future  High serum lipoprotein(a) -     Lipid panel; Future -     CT CARDIAC SCORING ([facility] LOCATIONS ONLY); Future  Primary hypertension- Her blood pressure is overcontrolled and she is hypokalemic.  Will discontinue the ARB and thiazide diuretic. -     CBC with Differential/Platelet; Future -     Urinalysis, Routine w reflex microscopic; Future  Greater trochanteric bursitis of both hips  Low back pain, unspecified back pain laterality, unspecified chronicity, unspecified whether sciatica present  Depression with anxiety -     DULoxetine HCl; Take 1 capsule (60 mg total) by mouth daily.  Dispense: 90 capsule; Refill: 1 -     busPIRone HCl; Take 1 tablet (10 mg total) by mouth 2 (two) times daily.  Dispense: 180 tablet; Refill: 0  Chronic bilateral low back pain without sciatica -     DULoxetine HCl; Take 1 capsule (60 mg total) by mouth daily.   Dispense: 90 capsule; Refill: 1  GAD (generalized anxiety disorder) -     clonazePAM; Take 1 tablet (0.5 mg total) by mouth 2 (two) times daily as needed for anxiety.  Dispense: 180 tablet; Refill: 0 -     TSH; Future  Idiopathic hypotension- Cortisol is normal. -     CBC with Differential/Platelet; Future -     Basic metabolic panel; Future -     Cortisol; Future -     TSH; Future -     Urinalysis, Routine w reflex microscopic; Future -     EKG 12-Lead  Witnessed episode of apnea -     Ambulatory referral to Sleep Studies  Other orders -     Cyclobenzaprine HCl; Take 1 tablet (5 mg total) by mouth 3 (three) times daily as needed.  Dispense: 40 tablet; Refill: 1     Follow-up: Return in about 3 months (around 03/12/2024).  Sanda Linger, MD

## 2023-12-15 ENCOUNTER — Encounter: Payer: Self-pay | Admitting: Internal Medicine

## 2023-12-17 ENCOUNTER — Encounter: Payer: Self-pay | Admitting: Internal Medicine

## 2023-12-22 ENCOUNTER — Other Ambulatory Visit: Payer: Self-pay | Admitting: Internal Medicine

## 2023-12-22 DIAGNOSIS — M7061 Trochanteric bursitis, right hip: Secondary | ICD-10-CM

## 2023-12-22 DIAGNOSIS — M545 Low back pain, unspecified: Secondary | ICD-10-CM

## 2023-12-22 DIAGNOSIS — I10 Essential (primary) hypertension: Secondary | ICD-10-CM

## 2024-01-19 ENCOUNTER — Ambulatory Visit
Admission: RE | Admit: 2024-01-19 | Discharge: 2024-01-19 | Disposition: A | Discharge status: Home / Self Care | Payer: Commercial Managed Care - PPO | Source: Ambulatory Visit | Attending: Internal Medicine | Admitting: Internal Medicine

## 2024-01-19 DIAGNOSIS — E7841 Elevated Lipoprotein(a): Secondary | ICD-10-CM

## 2024-01-19 DIAGNOSIS — E785 Hyperlipidemia, unspecified: Secondary | ICD-10-CM

## 2024-01-23 ENCOUNTER — Encounter: Payer: Self-pay | Admitting: Internal Medicine

## 2024-03-10 ENCOUNTER — Telehealth: Admitting: Physician Assistant

## 2024-03-10 DIAGNOSIS — B029 Zoster without complications: Secondary | ICD-10-CM

## 2024-03-10 MED ORDER — GABAPENTIN 100 MG PO CAPS: 100.0000 mg | ORAL_CAPSULE | Freq: Three times a day (TID) | ORAL | 0 refills | Status: DC

## 2024-03-10 MED ORDER — VALACYCLOVIR HCL 1 G PO TABS: 1000.0000 mg | ORAL_TABLET | Freq: Three times a day (TID) | ORAL | 0 refills | Status: AC

## 2024-03-10 NOTE — Patient Instructions (Signed)
 Danielle Cook, thank you for joining Malcom Scriver, PA-C for today's virtual visit.  While this provider is not your primary care provider (PCP), if your PCP is located in our provider database this encounter information will be shared with them immediately following your visit.   A [facility] MyChart account gives you access to today's visit and all your visits, tests, and labs performed at [facility] " click here if you don't have a [facility] MyChart account or go to [email or web address]  Consent: (Patient) Danielle Cook provided verbal consent for this virtual visit at the beginning of the encounter.  Current Medications:  Current Outpatient Medications:    gabapentin  (NEURONTIN ) 100 MG capsule, Take 1 capsule (100 mg total) by mouth 3 (three) times daily., Disp: 30 capsule, Rfl: 0   valACYclovir (VALTREX) 1000 MG tablet, Take 1 tablet (1,000 mg total) by mouth 3 (three) times daily for 10 days., Disp: 30 tablet, Rfl: 0   aspirin  EC 81 MG tablet, Take 1 tablet (81 mg total) by mouth daily. Swallow whole., Disp: 90 tablet, Rfl: 1   augmented betamethasone  dipropionate (DIPROLENE -AF) 0.05 % cream, Apply 1 application topically 2 (two) times daily., Disp: 30 g, Rfl: 1   busPIRone  (BUSPAR ) 10 MG tablet, Take 1 tablet (10 mg total) by mouth 2 (two) times daily., Disp: 180 tablet, Rfl: 0   clonazePAM  (KLONOPIN ) 0.5 MG tablet, Take 1 tablet (0.5 mg total) by mouth 2 (two) times daily as needed for anxiety., Disp: 180 tablet, Rfl: 0   cyclobenzaprine  (FLEXERIL ) 5 MG tablet, Take 1 tablet (5 mg total) by mouth 3 (three) times daily as needed., Disp: 40 tablet, Rfl: 1   DULoxetine  (CYMBALTA ) 60 MG capsule, Take 1 capsule (60 mg total) by mouth daily., Disp: 90 capsule, Rfl: 1   indapamide  (LOZOL ) 1.25 MG tablet, TAKE 1 TABLET BY MOUTH DAILY, Disp: 90 tablet, Rfl: 3   levonorgestrel (MIRENA) 20 MCG/24HR IUD, 1 Intra Uterine Device (1 each total) by Intrauterine route once.,  Disp: 1 each, Rfl: 0   meloxicam  (MOBIC ) 15 MG tablet, TAKE 1 TABLET BY MOUTH DAILY, Disp: 90 tablet, Rfl: 3   Misc Natural Products (ELDERBERRY ZINC/VIT C/IMMUNE MT), Take 1 tablet by mouth daily., Disp: , Rfl:    olmesartan  (BENICAR ) 20 MG tablet, TAKE 1 TABLET BY MOUTH DAILY, Disp: 90 tablet, Rfl: 3   Medications ordered in this encounter:  Meds ordered this encounter  Medications   gabapentin  (NEURONTIN ) 100 MG capsule    Sig: Take 1 capsule (100 mg total) by mouth 3 (three) times daily.    Dispense:  30 capsule    Refill:  0    Supervising Provider:   LAMPTEY, PHILIP O [[id number]]   valACYclovir (VALTREX) 1000 MG tablet    Sig: Take 1 tablet (1,000 mg total) by mouth 3 (three) times daily for 10 days.    Dispense:  30 tablet    Refill:  0    Supervising Provider:   LAMPTEY, PHILIP O [[id number]]     *If you need refills on other medications prior to your next appointment, please contact your pharmacy*  Follow-Up: Call back or seek an in-person evaluation if the symptoms worsen or if the condition fails to improve as anticipated.  [facility] [phone]  Other Instructions Your infection can cause chickenpox in others. If you have blisters that aren't scabs yet, stay away from: Babies. Pregnant people. Children who have eczema. Older people who have organ transplants.  People who have a long-term, or chronic, illness. Anyone who hasn't had chickenpox before. Anyone who hasn't gotten the chickenpox vaccine.      Shingles  Shingles, or herpes zoster, is an infection. It gives you a skin rash and blisters. These infected areas may hurt a lot. Shingles only happens if: You've had chickenpox. You've been given a shot called a vaccine to protect you from getting chickenpox. Shingles is rare in this case. What are the causes? Shingles is caused by a germ called the varicella-zoster virus. This is the same germ that causes chickenpox. After you're exposed to the  germ, it stays in your body but is dormant. This means it isn't active. Shingles happens if the germ becomes active again. This can happen years after you're first exposed to the germ. What increases the risk? You may be more likely to get shingles if: You're older than 48 years of age. You're under a lot of stress. You have a weak immune system. The immune system is your body's defense system. It may be weak if: You have human immunodeficiency virus (HIV). You have acquired immunodeficiency syndrome (AIDS). You have cancer. You take medicines that weaken your immune system. These include organ transplant medicines. What are the signs or symptoms? The first symptoms of shingles may be itching, tingling, or pain. Your skin may feel like it's burning. A few days or weeks later, you'll get a rash. Here's what you can expect: The rash is likely to be on one side of your body. The rash may be shaped like a belt or a band. Over time, it will turn into blisters filled with fluid. The blisters will break open and change into scabs. The scabs will dry up in about 2-3 weeks. You may also have: A fever. Chills. A headache. Nausea. How is this diagnosed? Shingles is diagnosed with a skin exam. A sample called a culture may be taken from one of your blisters and sent to a lab. This will show if you have shingles. How is this treated? The rash may last for several weeks. There's no cure for shingles, but your health care provider may give you medicines. These medicines may: Help with pain. Help with itching. Help with irritation and swelling. Help you get better sooner. Help to prevent long-term problems. If the rash is on your face, you may need to see an eye doctor or an ear, nose, and throat (ENT) doctor. Follow these instructions at home: Medicines Take your medicines only as told by your provider. Put an anti-itch cream or numbing cream on the rash or blisters as told by your  provider. Relieving itching and discomfort  To help with itching: Put cold, wet cloths called cold compresses on the rash or blisters. Take a cool bath. Try adding baking soda or dry oatmeal to the water. Do not bathe in hot water. Use calamine lotion on the rash or blisters. You can get this type of lotion at the store. Blister and rash care Keep your rash covered with a loose bandage. Wear loose clothes that don't rub on your rash. Take care of your rash as told by your provider. Make sure you: Wash your hands with soap and water for at least 20 seconds before and after you change your bandage. If you can't use soap and water, use hand sanitizer. Keep your rash and blisters clean by washing them with mild soap and cool water. Change your bandage. Check your rash every day for signs of  infection. Check for: More redness, swelling, or pain. Fluid or blood. Warmth. Pus or a bad smell. Do not scratch your rash. Do not pick at your blisters. To help you not scratch: Keep your fingernails clean and cut short. Try to wear gloves or mittens when you sleep. General instructions Rest. Wash your hands often with soap and water for at least 20 seconds. If you can't use soap and water, use hand sanitizer. Washing your hands lowers your chance of getting a skin infection. Your infection can cause chickenpox in others. If you have blisters that aren't scabs yet, stay away from: Babies. Pregnant people. Children who have eczema. Older people who have organ transplants. People who have a long-term, or chronic, illness. Anyone who hasn't had chickenpox before. Anyone who hasn't gotten the chickenpox vaccine. How is this prevented? Vaccines are the best way to prevent you from getting chickenpox or shingles. Talk with your provider about getting these shots. Where to find more information Centers for Disease Control and Prevention (CDC): TonerPromos.no Contact a health care provider if: Your pain  doesn't get better with medicine. Your pain doesn't get better after the rash heals. You have any signs of infection around the rash. Your rash or blisters get worse. You have a fever or chills. Get help right away if: The rash is on your face or nose. You have pain in your face or by your eye. You lose feeling on one side of your face. You have trouble seeing. You have ear pain or ringing in your ear. This information is not intended to replace advice given to you by your health care provider. Make sure you discuss any questions you have with your health care provider. Document Revised: 07/20/2023 Document Reviewed: 12/02/2022 Elsevier Patient Education  2024 [employer]vier [employer].   If you have been instructed to have an in-person evaluation today at a local Urgent Care facility, please use the link below. It will take you to a list of all of our available [facility], including address, phone number and hours of operation. Please do not delay care.  [facility]  If you or a family member do not have a primary care provider, use the link below to schedule a visit and establish care. When you choose a [facility] primary care physician or advanced practice provider, you gain a long-term partner in health. Find a Primary Care Provider  Learn more about [facility]'s in-office and virtual care options: [facility] - Get Care Now

## 2024-03-10 NOTE — Progress Notes (Signed)
 Virtual Visit Consent   [name], you are scheduled for a virtual visit with a [facility] provider today. Just as with appointments in the office, your consent must be obtained to participate. Your consent will be active for this visit and any virtual visit you may have with one of our providers in the next 365 days. If you have a MyChart account, a copy of this consent can be sent to you electronically.  As this is a virtual visit, video technology does not allow for your provider to perform a traditional examination. This may limit your provider's ability to fully assess your condition. If your provider identifies any concerns that need to be evaluated in person or the need to arrange testing (such as labs, EKG, etc.), we will make arrangements to do so. Although advances in technology are sophisticated, we cannot ensure that it will always work on either your end or our end. If the connection with a video visit is poor, the visit may have to be switched to a telephone visit. With either a video or telephone visit, we are not always able to ensure that we have a secure connection.  By engaging in this virtual visit, you consent to the provision of healthcare and authorize for your insurance to be billed (if applicable) for the services provided during this visit. Depending on your insurance coverage, you may receive a charge related to this service.  I need to obtain your verbal consent now. Are you willing to proceed with your visit today? [name] has provided verbal consent on 03/10/2024 for a virtual visit (video or telephone). Malcom Scriver, PA-C  Date: 03/10/2024 6:50 PM   Virtual Visit via Video Note   I, Ebrima Ranta S Mayers, connected with  [name]  ([id number], August 19, 1976) on 03/10/24 at  6:30 PM EDT by a video-enabled telemedicine application and verified that I am speaking with the correct person using two identifiers.  Location: Patient: Virtual Visit Location  Patient: Home Provider: Virtual Visit Location Provider: Home Office   I discussed the limitations of evaluation and management by telemedicine and the availability of in person appointments. The patient expressed understanding and agreed to proceed.    History of Present Illness: Danielle Cook is a 48 y.o. who identifies as a female who was assigned female at birth,  with a skin rash accompanied by numbness and pain.  Symptoms began last Sunday with numbness on the right side of her abdomen, later radiating to her back, resembling a pulled muscle. Muscle relaxers and lidocaine  roll-on cream did not alleviate the numbness.  The rash appeared today, described as red and raised, resembling hives, located on the right side of her abdomen and extending into her groin. Extra strength Tylenol  and 75 mg Lyrica provided some relief for the numbness. A heating pad was used for back pain relief. Pain and numbness have persisted throughout the week, with back pain feeling like a tight muscle.  She has not received a shingles vaccine yet, as she is under 50. She has a history of chickenpox twice as a child and a high chickenpox titer.    Problems:  Patient Active Problem List   Diagnosis Date Noted   GAD (generalized anxiety disorder) 12/14/2023   Idiopathic hypotension 12/14/2023   Witnessed episode of apnea 12/14/2023   Screening-pulmonary TB 10/16/2023   High serum lipoprotein(a) 06/04/2023   Dyslipidemia, goal LDL below 130 06/01/2023   Morbid obesity (HCC) 12/01/2022   Encounter  for general adult medical examination with abnormal findings 12/01/2022   Smoker 09/01/2022   Moderate episode of recurrent major depressive disorder (HCC) 01/06/2021   Greater trochanteric bursitis of both hips 01/06/2021   Low back pain 01/06/2021   Primary hypertension 08/24/2020   Right carpal tunnel syndrome 12/06/2019   Xerosis cutis 10/16/2019   Nicotine vapor product user 08/09/2018   Visit for  screening mammogram 12/13/2016   Family history of premature CAD 12/13/2016   Radiculitis of left cervical region 03/23/2016   PPD positive, treated 03/22/2013   Routine general medical examination at a health care facility 03/22/2013   Allergic rhinitis 03/22/2013   Depression with anxiety 10/31/2011    Allergies:  Allergies  Allergen Reactions   Egg-Derived Products     Sensitivity    Guaifenesin Er Anxiety and Palpitations   Latex Hives and Rash   Wellbutrin [Bupropion Hcl] Anxiety and Palpitations   Medications:  Current Outpatient Medications:    gabapentin  (NEURONTIN ) 100 MG capsule, Take 1 capsule (100 mg total) by mouth 3 (three) times daily., Disp: 30 capsule, Rfl: 0   valACYclovir (VALTREX) 1000 MG tablet, Take 1 tablet (1,000 mg total) by mouth 3 (three) times daily for 10 days., Disp: 30 tablet, Rfl: 0   aspirin  EC 81 MG tablet, Take 1 tablet (81 mg total) by mouth daily. Swallow whole., Disp: 90 tablet, Rfl: 1   augmented betamethasone  dipropionate (DIPROLENE -AF) 0.05 % cream, Apply 1 application topically 2 (two) times daily., Disp: 30 g, Rfl: 1   busPIRone  (BUSPAR ) 10 MG tablet, Take 1 tablet (10 mg total) by mouth 2 (two) times daily., Disp: 180 tablet, Rfl: 0   clonazePAM  (KLONOPIN ) 0.5 MG tablet, Take 1 tablet (0.5 mg total) by mouth 2 (two) times daily as needed for anxiety., Disp: 180 tablet, Rfl: 0   cyclobenzaprine  (FLEXERIL ) 5 MG tablet, Take 1 tablet (5 mg total) by mouth 3 (three) times daily as needed., Disp: 40 tablet, Rfl: 1   DULoxetine  (CYMBALTA ) 60 MG capsule, Take 1 capsule (60 mg total) by mouth daily., Disp: 90 capsule, Rfl: 1   indapamide  (LOZOL ) 1.25 MG tablet, TAKE 1 TABLET BY MOUTH DAILY, Disp: 90 tablet, Rfl: 3   levonorgestrel (MIRENA) 20 MCG/24HR IUD, 1 Intra Uterine Device (1 each total) by Intrauterine route once., Disp: 1 each, Rfl: 0   meloxicam  (MOBIC ) 15 MG tablet, TAKE 1 TABLET BY MOUTH DAILY, Disp: 90 tablet, Rfl: 3   Misc Natural  Products (ELDERBERRY ZINC/VIT C/IMMUNE MT), Take 1 tablet by mouth daily., Disp: , Rfl:    olmesartan  (BENICAR ) 20 MG tablet, TAKE 1 TABLET BY MOUTH DAILY, Disp: 90 tablet, Rfl: 3  Observations/Objective: Patient is well-developed, well-nourished in no acute distress.  Resting comfortably  at home.  Head is normocephalic, atraumatic.  No labored breathing.  Speech is clear and coherent with logical content.  Patient is alert and oriented at baseline.    Assessment and Plan: 1. Herpes zoster without complication (Primary) - gabapentin  (NEURONTIN ) 100 MG capsule; Take 1 capsule (100 mg total) by mouth 3 (three) times daily.  Dispense: 30 capsule; Refill: 0 - valACYclovir (VALTREX) 1000 MG tablet; Take 1 tablet (1,000 mg total) by mouth 3 (three) times daily for 10 days.  Dispense: 30 tablet; Refill: 0   Shingles diagnosis suggested by dermatomal rash pattern. Initiated Valacyclovir within treatment window. Gabapentin  chosen for pain management. - Prescribed Valacyclovir 1000 mg TID for 10 days. - Prescribed Gabapentin  100 mg TID for pain. - Advised follow-up  with PCP near end of treatment or sooner if needed. - Recommended shingles vaccination post-resolution to prevent recurrence.   Follow Up Instructions: I discussed the assessment and treatment plan with the patient. The patient was provided an opportunity to ask questions and all were answered. The patient agreed with the plan and demonstrated an understanding of the instructions.  A copy of instructions were sent to the patient via MyChart unless otherwise noted below.     The patient was advised to call back or seek an in-person evaluation if the symptoms worsen or if the condition fails to improve as anticipated.    Etter Hermann Mayers, PA-C

## 2024-03-11 ENCOUNTER — Encounter: Payer: Self-pay | Admitting: Internal Medicine

## 2024-03-12 ENCOUNTER — Encounter: Payer: Self-pay | Admitting: Physician Assistant

## 2024-03-13 NOTE — Telephone Encounter (Signed)
 Patient has been scheduled for an appointment.

## 2024-03-18 ENCOUNTER — Ambulatory Visit

## 2024-03-18 ENCOUNTER — Encounter: Payer: Self-pay | Admitting: Family Medicine

## 2024-03-18 ENCOUNTER — Ambulatory Visit: Admitting: Family Medicine

## 2024-03-18 ENCOUNTER — Ambulatory Visit: Payer: Self-pay | Admitting: Family Medicine

## 2024-03-18 VITALS — BP 132/86 | HR 77 | Temp 98.3°F | Ht 64.0 in | Wt 244.0 lb

## 2024-03-18 DIAGNOSIS — R739 Hyperglycemia, unspecified: Secondary | ICD-10-CM

## 2024-03-18 DIAGNOSIS — E785 Hyperlipidemia, unspecified: Secondary | ICD-10-CM

## 2024-03-18 DIAGNOSIS — R918 Other nonspecific abnormal finding of lung field: Secondary | ICD-10-CM | POA: Insufficient documentation

## 2024-03-18 DIAGNOSIS — B0229 Other postherpetic nervous system involvement: Secondary | ICD-10-CM | POA: Insufficient documentation

## 2024-03-18 DIAGNOSIS — R911 Solitary pulmonary nodule: Secondary | ICD-10-CM | POA: Diagnosis not present

## 2024-03-18 DIAGNOSIS — B028 Zoster with other complications: Secondary | ICD-10-CM | POA: Insufficient documentation

## 2024-03-18 LAB — CBC WITH DIFFERENTIAL/PLATELET
Basophils Absolute: 0.1 10*3/uL (ref 0.0–0.1)
Basophils Relative: 0.8 % (ref 0.0–3.0)
Eosinophils Absolute: 0.2 10*3/uL (ref 0.0–0.7)
Eosinophils Relative: 3.1 % (ref 0.0–5.0)
HCT: 43.2 % (ref 36.0–46.0)
Hemoglobin: 14.4 g/dL (ref 12.0–15.0)
Lymphocytes Relative: 33.6 % (ref 12.0–46.0)
Lymphs Abs: 2.5 10*3/uL (ref 0.7–4.0)
MCHC: 33.5 g/dL (ref 30.0–36.0)
MCV: 86.9 fl (ref 78.0–100.0)
Monocytes Absolute: 0.6 10*3/uL (ref 0.1–1.0)
Monocytes Relative: 8.3 % (ref 3.0–12.0)
Neutro Abs: 4 10*3/uL (ref 1.4–7.7)
Neutrophils Relative %: 54.2 % (ref 43.0–77.0)
Platelets: 284 10*3/uL (ref 150.0–400.0)
RBC: 4.97 Mil/uL (ref 3.87–5.11)
RDW: 13.5 % (ref 11.5–15.5)
WBC: 7.4 10*3/uL (ref 4.0–10.5)

## 2024-03-18 LAB — COMPREHENSIVE METABOLIC PANEL WITH GFR
ALT: 34 U/L (ref 0–35)
AST: 30 U/L (ref 0–37)
Albumin: 4.3 g/dL (ref 3.5–5.2)
Alkaline Phosphatase: 52 U/L (ref 39–117)
BUN: 7 mg/dL (ref 6–23)
CO2: 33 meq/L — ABNORMAL HIGH (ref 19–32)
Calcium: 9.6 mg/dL (ref 8.4–10.5)
Chloride: 97 meq/L (ref 96–112)
Creatinine, Ser: 0.57 mg/dL (ref 0.40–1.20)
GFR: 107.58 mL/min (ref >60.00)
Glucose, Bld: 155 mg/dL — ABNORMAL HIGH (ref 70–99)
Potassium: 3 meq/L — ABNORMAL LOW (ref 3.5–5.1)
Sodium: 140 meq/L (ref 135–145)
Total Bilirubin: 0.6 mg/dL (ref 0.2–1.2)
Total Protein: 6.9 g/dL (ref 6.0–8.3)

## 2024-03-18 MED ORDER — TRIAMCINOLONE ACETONIDE 0.1 % EX CREA: 1.0000 | TOPICAL_CREAM | Freq: Two times a day (BID) | CUTANEOUS | 0 refills | Status: AC

## 2024-03-18 MED ORDER — GABAPENTIN 100 MG PO CAPS: 100.0000 mg | ORAL_CAPSULE | Freq: Three times a day (TID) | ORAL | 0 refills | Status: DC

## 2024-03-18 NOTE — Patient Instructions (Signed)
 Finish course of Valtrex   Refilled gabapentin  to take 1 capsule 3 times daily as needed  We are checking labs today, will be in contact with any results that require further attention  Placed order for follow-up CT in 3 months to further evaluate pulmonary nodule.  Send will be reaching out to get you scheduled for this.  Follow-up with me for new or worsening symptoms.

## 2024-03-18 NOTE — Progress Notes (Signed)
 Acute Office Visit  Subjective:     Patient ID: Danielle Cook, female    DOB: Apr 06, 1976, 48 y.o.   MRN: [id number]  Chief Complaint  Patient presents with   Follow-up    HPI Patient is in today for reevaluation of shingles. She was seen for a virtual visit on 03/10/2024 and was diagnosed with shingles. She was treated with Valtrex  1 g 3 times daily x 10 days, gabapentin  100 mg 3 times daily. Reports that things are improving, but she only has 2 days left of gabapentin .  Reports that the area is still very numb and tender.  Reports that the rash is healing well.  Has 3 days left of Valtrex  as well. Has been using lidocaine  and calamine lotion to the area.  Had pulmonary nodule on cardiac CT score, states no one has gotten in touch with her about a follow-up CT.  Inquiring about why she may have gotten shingles, does not recall any systemic stress, illness  ROS Per HPI      Objective:    BP 132/86 (BP Location: Left Arm, Patient Position: Sitting)   Pulse 77   Temp 98.3 F (36.8 C) (Temporal)   Ht 5\' 4"  (1.626 m)   Wt 244 lb (110.7 kg)   SpO2 95%   BMI 41.88 kg/m    Physical Exam Vitals and nursing note reviewed.  Constitutional:      General: She is not in acute distress.    Appearance: Normal appearance. She is normal weight.  HENT:     Head: Normocephalic and atraumatic.     Right Ear: External ear normal.     Left Ear: External ear normal.     Nose: Nose normal.     Mouth/Throat:     Mouth: Mucous membranes are moist.     Pharynx: Oropharynx is clear.  Eyes:     Extraocular Movements: Extraocular movements intact.     Pupils: Pupils are equal, round, and reactive to light.  Cardiovascular:     Rate and Rhythm: Normal rate.  Pulmonary:     Effort: Pulmonary effort is normal.  Musculoskeletal:        General: Normal range of motion.     Cervical back: Normal range of motion.     Right lower leg: No edema.     Left lower leg: No edema.   Lymphadenopathy:     Cervical: No cervical adenopathy.  Skin:         Comments: Areas of erythematous scaly rash. Area to R thoracic back with erythematous vesicular rash c/w HZV. No discharge from any areas of the rash  Neurological:     General: No focal deficit present.     Mental Status: She is alert and oriented to person, place, and time.  Psychiatric:        Mood and Affect: Mood normal.        Thought Content: Thought content normal.     No results found for any visits on 03/18/24.      Assessment & Plan:   Herpes zoster with complication -     Triamcinolone  Acetonide; Apply 1 Application topically 2 (two) times daily.  Dispense: 30 g; Refill: 0 -     CBC with Differential/Platelet -     Comprehensive metabolic panel with GFR  Postherpetic neuralgia -     Gabapentin ; Take 1 capsule (100 mg total) by mouth 3 (three) times daily.  Dispense: 60 capsule; Refill: 0 -  Triamcinolone  Acetonide; Apply 1 Application topically 2 (two) times daily.  Dispense: 30 g; Refill: 0  Pulmonary nodule -     CT CHEST WO CONTRAST; Future  Abnormal findings on diagnostic imaging of lung -     CT CHEST WO CONTRAST; Future -     CBC with Differential/Platelet -     Comprehensive metabolic panel with GFR     Meds ordered this encounter  Medications   gabapentin  (NEURONTIN ) 100 MG capsule    Sig: Take 1 capsule (100 mg total) by mouth 3 (three) times daily.    Dispense:  60 capsule    Refill:  0   triamcinolone  cream (KENALOG ) 0.1 %    Sig: Apply 1 Application topically 2 (two) times daily.    Dispense:  30 g    Refill:  0    Return if symptoms worsen or fail to improve.  Wellington Half, FNP

## 2024-03-19 ENCOUNTER — Ambulatory Visit: Payer: Self-pay | Admitting: Family Medicine

## 2024-03-19 ENCOUNTER — Other Ambulatory Visit (INDEPENDENT_AMBULATORY_CARE_PROVIDER_SITE_OTHER)

## 2024-03-19 DIAGNOSIS — E785 Hyperlipidemia, unspecified: Secondary | ICD-10-CM

## 2024-03-19 DIAGNOSIS — R739 Hyperglycemia, unspecified: Secondary | ICD-10-CM | POA: Diagnosis not present

## 2024-03-19 DIAGNOSIS — E876 Hypokalemia: Secondary | ICD-10-CM

## 2024-03-19 DIAGNOSIS — E782 Mixed hyperlipidemia: Secondary | ICD-10-CM

## 2024-03-19 LAB — LIPID PANEL
Cholesterol: 239 mg/dL — ABNORMAL HIGH (ref 0–200)
HDL: 37.2 mg/dL — ABNORMAL LOW (ref >39.00)
LDL Cholesterol: 159 mg/dL — ABNORMAL HIGH (ref 0–99)
NonHDL: 202.12
Total CHOL/HDL Ratio: 6
Triglycerides: 218 mg/dL — ABNORMAL HIGH (ref 0.0–149.0)
VLDL: 43.6 mg/dL — ABNORMAL HIGH (ref 0.0–40.0)

## 2024-03-19 LAB — HEMOGLOBIN A1C: Hgb A1c MFr Bld: 6.3 % (ref 4.6–6.5)

## 2024-03-19 MED ORDER — ROSUVASTATIN CALCIUM 10 MG PO TABS: 10.0000 mg | ORAL_TABLET | Freq: Every day | ORAL | 1 refills | Status: DC

## 2024-03-22 MED ORDER — POTASSIUM CHLORIDE ER 10 MEQ PO TBCR
10.0000 meq | EXTENDED_RELEASE_TABLET | Freq: Every day | ORAL | 0 refills | Status: DC
Start: 2024-03-22 — End: 2024-04-23

## 2024-03-29 ENCOUNTER — Other Ambulatory Visit: Payer: Self-pay | Admitting: Internal Medicine

## 2024-03-29 DIAGNOSIS — F418 Other specified anxiety disorders: Secondary | ICD-10-CM

## 2024-04-15 ENCOUNTER — Other Ambulatory Visit: Payer: Self-pay | Admitting: Internal Medicine

## 2024-04-15 DIAGNOSIS — R911 Solitary pulmonary nodule: Secondary | ICD-10-CM

## 2024-04-15 DIAGNOSIS — R918 Other nonspecific abnormal finding of lung field: Secondary | ICD-10-CM

## 2024-04-19 ENCOUNTER — Other Ambulatory Visit: Payer: Self-pay | Admitting: Family Medicine

## 2024-04-19 DIAGNOSIS — E876 Hypokalemia: Secondary | ICD-10-CM

## 2024-04-23 ENCOUNTER — Other Ambulatory Visit: Payer: Self-pay | Admitting: Family Medicine

## 2024-04-23 DIAGNOSIS — E876 Hypokalemia: Secondary | ICD-10-CM

## 2024-05-16 ENCOUNTER — Other Ambulatory Visit: Payer: Self-pay | Admitting: Family Medicine

## 2024-05-16 DIAGNOSIS — E876 Hypokalemia: Secondary | ICD-10-CM

## 2024-05-30 ENCOUNTER — Encounter: Payer: Self-pay | Admitting: Internal Medicine

## 2024-05-31 ENCOUNTER — Other Ambulatory Visit: Payer: Self-pay

## 2024-05-31 DIAGNOSIS — F411 Generalized anxiety disorder: Secondary | ICD-10-CM

## 2024-05-31 DIAGNOSIS — F418 Other specified anxiety disorders: Secondary | ICD-10-CM

## 2024-06-01 MED ORDER — BUSPIRONE HCL 10 MG PO TABS: 10.0000 mg | ORAL_TABLET | Freq: Two times a day (BID) | ORAL | 0 refills | Status: DC

## 2024-06-01 MED ORDER — CLONAZEPAM 0.5 MG PO TABS
0.5000 mg | ORAL_TABLET | Freq: Two times a day (BID) | ORAL | 0 refills | Status: AC | PRN
Start: 2024-06-01 — End: ?

## 2024-07-30 ENCOUNTER — Telehealth: Payer: Self-pay | Admitting: Internal Medicine

## 2024-07-30 NOTE — Telephone Encounter (Signed)
-----   Message from Debby Molt sent at 01/23/2024 12:45 PM EDT ----- Regarding: RUL nodule Repeat CT

## 2024-08-07 ENCOUNTER — Encounter: Payer: Self-pay | Admitting: Internal Medicine

## 2024-08-13 ENCOUNTER — Other Ambulatory Visit: Payer: Self-pay | Admitting: Internal Medicine

## 2024-08-13 DIAGNOSIS — F418 Other specified anxiety disorders: Secondary | ICD-10-CM

## 2024-08-13 DIAGNOSIS — G8929 Other chronic pain: Secondary | ICD-10-CM

## 2024-08-13 NOTE — Telephone Encounter (Unsigned)
 Copied from CRM [id number]. Topic: Clinical - Medication Refill >> Aug 13, 2024  8:57 AM Eva FALCON wrote: Medication: DULoxetine  (CYMBALTA ) 60 MG capsule  Has the patient contacted their pharmacy? Yes (Agent: If no, request that the patient contact the pharmacy for the refill. If patient does not wish to contact the pharmacy document the reason why and proceed with request.) (Agent: If yes, when and what did the pharmacy advise?)  This is the patient's preferred pharmacy:  [id number] [place], [place] - [address] [place] [address] Phone: [phone] Fax: [phone]  [email or web address] - [place], [place] - [address] [place] [address] Phone: [phone] Fax: [phone]  Is this the correct pharmacy for this prescription? Yes If no, delete pharmacy and type the correct one.   Has the prescription been filled recently? Yes  Is the patient out of the medication? No  Has the patient been seen for an appointment in the last year OR does the patient have an upcoming appointment? Yes  Can we respond through MyChart? Yes  Agent: Please be advised that Rx refills may take up to 3 business days. We ask that you follow-up with your pharmacy.

## 2024-08-14 MED ORDER — DULOXETINE HCL 60 MG PO CPEP: 60.0000 mg | ORAL_CAPSULE | Freq: Every day | ORAL | 1 refills | Status: DC

## 2024-08-15 ENCOUNTER — Ambulatory Visit: Admitting: Internal Medicine

## 2024-08-15 ENCOUNTER — Encounter: Payer: Self-pay | Admitting: Internal Medicine

## 2024-08-15 VITALS — BP 142/86 | HR 82 | Temp 99.2°F | Resp 16 | Ht 64.0 in | Wt 247.4 lb

## 2024-08-15 DIAGNOSIS — B0229 Other postherpetic nervous system involvement: Secondary | ICD-10-CM

## 2024-08-15 DIAGNOSIS — Z23 Encounter for immunization: Secondary | ICD-10-CM | POA: Diagnosis not present

## 2024-08-15 DIAGNOSIS — R7303 Prediabetes: Secondary | ICD-10-CM | POA: Diagnosis not present

## 2024-08-15 DIAGNOSIS — F411 Generalized anxiety disorder: Secondary | ICD-10-CM

## 2024-08-15 DIAGNOSIS — F418 Other specified anxiety disorders: Secondary | ICD-10-CM

## 2024-08-15 DIAGNOSIS — I10 Essential (primary) hypertension: Secondary | ICD-10-CM

## 2024-08-15 DIAGNOSIS — E785 Hyperlipidemia, unspecified: Secondary | ICD-10-CM

## 2024-08-15 DIAGNOSIS — N951 Menopausal and female climacteric states: Secondary | ICD-10-CM | POA: Diagnosis not present

## 2024-08-15 DIAGNOSIS — E119 Type 2 diabetes mellitus without complications: Secondary | ICD-10-CM

## 2024-08-15 DIAGNOSIS — G8929 Other chronic pain: Secondary | ICD-10-CM

## 2024-08-15 DIAGNOSIS — Z1211 Encounter for screening for malignant neoplasm of colon: Secondary | ICD-10-CM

## 2024-08-15 DIAGNOSIS — E876 Hypokalemia: Secondary | ICD-10-CM

## 2024-08-15 DIAGNOSIS — R911 Solitary pulmonary nodule: Secondary | ICD-10-CM

## 2024-08-15 DIAGNOSIS — M545 Low back pain, unspecified: Secondary | ICD-10-CM

## 2024-08-15 MED ORDER — DULOXETINE HCL 60 MG PO CPEP: 60.0000 mg | ORAL_CAPSULE | Freq: Every day | ORAL | 1 refills | Status: AC

## 2024-08-15 MED ORDER — GABAPENTIN 100 MG PO CAPS
100.0000 mg | ORAL_CAPSULE | Freq: Three times a day (TID) | ORAL | 1 refills | Status: AC
Start: 2024-08-15 — End: ?

## 2024-08-15 MED ORDER — BUSPIRONE HCL 10 MG PO TABS: 20.0000 mg | ORAL_TABLET | Freq: Two times a day (BID) | ORAL | 1 refills | Status: AC

## 2024-08-15 NOTE — Patient Instructions (Signed)
 Hypertension, Adult High blood pressure (hypertension) is when the force of blood pumping through the arteries is too strong. The arteries are the blood vessels that carry blood from the heart throughout the body. Hypertension forces the heart to work harder to pump blood and may cause arteries to become narrow or stiff. Untreated or uncontrolled hypertension can lead to a heart attack, heart failure, a stroke, kidney disease, and other problems. A blood pressure reading consists of a higher number over a lower number. Ideally, your blood pressure should be below 120/80. The first ("top") number is called the systolic pressure. It is a measure of the pressure in your arteries as your heart beats. The second ("bottom") number is called the diastolic pressure. It is a measure of the pressure in your arteries as the heart relaxes. What are the causes? The exact cause of this condition is not known. There are some conditions that result in high blood pressure. What increases the risk? Certain factors may make you more likely to develop high blood pressure. Some of these risk factors are under your control, including: Smoking. Not getting enough exercise or physical activity. Being overweight. Having too much fat, sugar, calories, or salt (sodium) in your diet. Drinking too much alcohol. Other risk factors include: Having a personal history of heart disease, diabetes, high cholesterol, or kidney disease. Stress. Having a family history of high blood pressure and high cholesterol. Having obstructive sleep apnea. Age. The risk increases with age. What are the signs or symptoms? High blood pressure may not cause symptoms. Very high blood pressure (hypertensive crisis) may cause: Headache. Fast or irregular heartbeats (palpitations). Shortness of breath. Nosebleed. Nausea and vomiting. Vision changes. Severe chest pain, dizziness, and seizures. How is this diagnosed? This condition is diagnosed by  measuring your blood pressure while you are seated, with your arm resting on a flat surface, your legs uncrossed, and your feet flat on the floor. The cuff of the blood pressure monitor will be placed directly against the skin of your upper arm at the level of your heart. Blood pressure should be measured at least twice using the same arm. Certain conditions can cause a difference in blood pressure between your right and left arms. If you have a high blood pressure reading during one visit or you have normal blood pressure with other risk factors, you may be asked to: Return on a different day to have your blood pressure checked again. Monitor your blood pressure at home for 1 week or longer. If you are diagnosed with hypertension, you may have other blood or imaging tests to help your health care provider understand your overall risk for other conditions. How is this treated? This condition is treated by making healthy lifestyle changes, such as eating healthy foods, exercising more, and reducing your alcohol intake. You may be referred for counseling on a healthy diet and physical activity. Your health care provider may prescribe medicine if lifestyle changes are not enough to get your blood pressure under control and if: Your systolic blood pressure is above 130. Your diastolic blood pressure is above 80. Your personal target blood pressure may vary depending on your medical conditions, your age, and other factors. Follow these instructions at home: Eating and drinking  Eat a diet that is high in fiber and potassium, and low in sodium, added sugar, and fat. An example of this eating plan is called the DASH diet. DASH stands for Dietary Approaches to Stop Hypertension. To eat this way: Eat  plenty of fresh fruits and vegetables. Try to fill one half of your plate at each meal with fruits and vegetables. Eat whole grains, such as whole-wheat pasta, brown rice, or whole-grain bread. Fill about one  fourth of your plate with whole grains. Eat or drink low-fat dairy products, such as skim milk or low-fat yogurt. Avoid fatty cuts of meat, processed or cured meats, and poultry with skin. Fill about one fourth of your plate with lean proteins, such as fish, chicken without skin, beans, eggs, or tofu. Avoid pre-made and processed foods. These tend to be higher in sodium, added sugar, and fat. Reduce your daily sodium intake. Many people with hypertension should eat less than 1,500 mg of sodium a day. Do not drink alcohol if: Your health care provider tells you not to drink. You are pregnant, may be pregnant, or are planning to become pregnant. If you drink alcohol: Limit how much you have to: 0-1 drink a day for women. 0-2 drinks a day for men. Know how much alcohol is in your drink. In the [place] one drink equals one 12 oz bottle of beer (355 mL), one 5 oz glass of wine (148 mL), or one 1 oz glass of hard liquor (44 mL). Lifestyle  Work with your health care provider to maintain a healthy body weight or to lose weight. Ask what an ideal weight is for you. Get at least 30 minutes of exercise that causes your heart to beat faster (aerobic exercise) most days of the week. Activities may include walking, swimming, or biking. Include exercise to strengthen your muscles (resistance exercise), such as Pilates or lifting weights, as part of your weekly exercise routine. Try to do these types of exercises for 30 minutes at least 3 days a week. Do not use any products that contain nicotine or tobacco. These products include cigarettes, chewing tobacco, and vaping devices, such as e-cigarettes. If you need help quitting, ask your health care provider. Monitor your blood pressure at home as told by your health care provider. Keep all follow-up visits. This is important. Medicines Take over-the-counter and prescription medicines only as told by your health care provider. Follow directions carefully. Blood  pressure medicines must be taken as prescribed. Do not skip doses of blood pressure medicine. Doing this puts you at risk for problems and can make the medicine less effective. Ask your health care provider about side effects or reactions to medicines that you should watch for. Contact a health care provider if you: Think you are having a reaction to a medicine you are taking. Have headaches that keep coming back (recurring). Feel dizzy. Have swelling in your ankles. Have trouble with your vision. Get help right away if you: Develop a severe headache or confusion. Have unusual weakness or numbness. Feel faint. Have severe pain in your chest or abdomen. Vomit repeatedly. Have trouble breathing. These symptoms may be an emergency. Get help right away. Call 911. Do not wait to see if the symptoms will go away. Do not drive yourself to the hospital. Summary Hypertension is when the force of blood pumping through your arteries is too strong. If this condition is not controlled, it may put you at risk for serious complications. Your personal target blood pressure may vary depending on your medical conditions, your age, and other factors. For most people, a normal blood pressure is less than 120/80. Hypertension is treated with lifestyle changes, medicines, or a combination of both. Lifestyle changes include losing weight, eating a healthy,  low-sodium diet, exercising more, and limiting alcohol. This information is not intended to replace advice given to you by your health care provider. Make sure you discuss any questions you have with your health care provider. Document Revised: 08/24/2021 Document Reviewed: 08/24/2021 Elsevier Patient Education  2024 [employer].

## 2024-08-15 NOTE — Progress Notes (Signed)
 Subjective:  Patient ID: Danielle Cook, female    DOB: 20-May-1976  Age: 48 y.o. MRN: [id number]  CC: Hypertension   HPI Danielle Cook presents for f/up ---  Discussed the use of AI scribe software for clinical note transcription with the patient, who gave verbal consent to proceed.  History of Present Illness Danielle Cook is a 48 year old female with anxiety and menopausal symptoms who presents with night sweats and mood changes.  She experiences night sweats every night despite maintaining a cool room temperature of 68 degrees with the use of both a ceiling and floor fan. She also notes increased moodiness and irritability, particularly towards the end of the day. She attributes this to her anxiety, which she manages with Buspar  taken in the morning. She feels that the medication's effects diminish by the end of the day, leading to increased 'snappiness' with others.  She is currently taking Buspar  10 mg and reports that it works for her, but she feels its effects diminish by the end of the day. She also takes duloxetine  for anxiety and avoids Klonopin  due to its sedative effects, which interfere with her daily activities. She reserves Klonopin  for bedtime use when necessary.  She mentions a gradual weight gain of 5 to 10 pounds annually and is attempting dietary changes to manage her weight and blood sugar levels. She reports occasional headaches and has noted elevated blood sugar readings of 163 and 181 mg/dL over the weekend. She is trying to reduce sugar and carbohydrate intake.  She has a history of using an IUD and has not had menstrual cycles since its placement. Her family history includes her mother experiencing early menopause following a partial hysterectomy, but no significant family history of breast cancer is noted.  She takes indapamide  for chronic leg swelling and requests refills for her medications, including duloxetine , potassium, and gabapentin , which she  uses for shingles pain management.  No trouble sleeping, anhedonia, apathy, suicidal or homicidal thoughts, excessive thirst, or excessive urination. She reports weight gain and occasional headaches. She has chronic leg swelling managed with indapamide .     Outpatient Medications Prior to Visit  Medication Sig Dispense Refill   aspirin  EC 81 MG tablet Take 1 tablet (81 mg total) by mouth daily. Swallow whole. 90 tablet 1   augmented betamethasone  dipropionate (DIPROLENE -AF) 0.05 % cream Apply 1 application topically 2 (two) times daily. 30 g 1   clonazePAM  (KLONOPIN ) 0.5 MG tablet Take 1 tablet (0.5 mg total) by mouth 2 (two) times daily as needed for anxiety. 180 tablet 0   indapamide  (LOZOL ) 1.25 MG tablet TAKE 1 TABLET BY MOUTH DAILY 90 tablet 3   levonorgestrel (MIRENA) 20 MCG/24HR IUD 1 Intra Uterine Device (1 each total) by Intrauterine route once. 1 each 0   meloxicam  (MOBIC ) 15 MG tablet TAKE 1 TABLET BY MOUTH DAILY 90 tablet 3   Misc Natural Products (ELDERBERRY ZINC/VIT C/IMMUNE MT) Take 1 tablet by mouth daily.     triamcinolone  cream (KENALOG ) 0.1 % Apply 1 Application topically 2 (two) times daily. 30 g 0   TURMERIC CURCUMIN PO      busPIRone  (BUSPAR ) 10 MG tablet Take 1 tablet (10 mg total) by mouth 2 (two) times daily. 180 tablet 0   COENZYME Q10-RED YEAST RICE      cyclobenzaprine  (FLEXERIL ) 5 MG tablet Take 1 tablet (5 mg total) by mouth 3 (three) times daily as needed. 40 tablet 1   DULoxetine  (CYMBALTA ) 60 MG  capsule Take 1 capsule (60 mg total) by mouth daily. 90 capsule 1   gabapentin  (NEURONTIN ) 100 MG capsule Take 1 capsule (100 mg total) by mouth 3 (three) times daily. 60 capsule 0   potassium chloride  (KLOR-CON ) 10 MEQ tablet TAKE 1 TABLET BY MOUTH EVERY DAY 90 tablet 1   CITRUS BERGAMOT PO      rosuvastatin  (CRESTOR ) 10 MG tablet Take 1 tablet (10 mg total) by mouth daily. 90 tablet 1   No facility-administered medications prior to visit.    ROS Review of  Systems  Objective:  BP (!) 142/86 (BP Location: Left Arm, Patient Position: Sitting, Cuff Size: Normal)   Pulse 82   Temp 99.2 F (37.3 C) (Oral)   Resp 16   Ht 5' 4 (1.626 m)   Wt 247 lb 6.4 oz (112.2 kg)   LMP  (LMP Unknown)   SpO2 95%   BMI 42.47 kg/m   BP Readings from Last 3 Encounters:  08/15/24 (!) 142/86  03/18/24 132/86  12/14/23 110/62    Wt Readings from Last 3 Encounters:  08/15/24 247 lb 6.4 oz (112.2 kg)  03/18/24 244 lb (110.7 kg)  12/14/23 241 lb 6 oz (109.5 kg)    Physical Exam  Lab Results  Component Value Date   WBC 7.4 03/18/2024   HGB 14.4 03/18/2024   HCT 43.2 03/18/2024   PLT 284.0 03/18/2024   GLUCOSE 87 08/15/2024   CHOL 239 (H) 03/19/2024   TRIG 218.0 (H) 03/19/2024   HDL 37.20 (L) 03/19/2024   LDLCALC 159 (H) 03/19/2024   ALT 34 03/18/2024   AST 30 03/18/2024   NA 137 08/15/2024   K 3.4 (L) 08/15/2024   CL 96 08/15/2024   CREATININE 0.59 08/15/2024   BUN 13 08/15/2024   CO2 32 08/15/2024   TSH 1.32 08/15/2024   HGBA1C 6.5 08/15/2024    CT CARDIAC SCORING ([facility] LOCATIONS ONLY) Result Date: 01/23/2024 CLINICAL DATA:  48 year old Caucasian female with history of dyslipidemia and family history of coronary artery disease. * Tracking Code: FCC * EXAM: CT CARDIAC CORONARY ARTERY CALCIUM  SCORE TECHNIQUE: Non-contrast imaging through the heart was performed using prospective ECG gating. Image post processing was performed on an independent workstation, allowing for quantitative analysis of the heart and coronary arteries. Note that this exam targets the heart and the chest was not imaged in its entirety. COMPARISON:  None available. FINDINGS: CORONARY CALCIUM  SCORES: Left Main: 0 LAD: 0 LCx: 0 RCA/PDA: 0 Total Agatston Score: 0 MESA database percentile: N/A AORTA MEASUREMENTS: Ascending Aorta: 3.0 cm Descending Aorta:2.2 cm OTHER FINDINGS: Right upper lobe pulmonary nodule (axial image 7 of series 9) measuring 9 x 11 mm (mean diameter of 1 cm).  Within the visualized portions of the thorax there is no acute consolidative airspace disease, no pleural effusions, no pneumothorax and no lymphadenopathy. Visualized portions of the upper abdomen are unremarkable. There are no aggressive appearing lytic or blastic lesions noted in the visualized portions of the skeleton. IMPRESSION: 1. Patient's total coronary artery calcium  score is 0 which indicates a very low (but non-zero) risk of significant coronary artery atherosclerosis. 2. Right upper lobe pulmonary nodule with a mean diameter of 10 mm. No prior studies are available for comparison. Consider one of the following in 3 months for both low-risk and high-risk individuals: (a) repeat chest CT or (b) follow-up PET-CT. This recommendation follows the consensus statement: Guidelines for Management of Incidental Pulmonary Nodules Detected on CT Images: From the Fleischner Society 2017;  Radiology 2017; [id number]. Electronically Signed   By: Toribio Aye M.D.   On: 01/23/2024 12:23    Assessment & Plan:  Primary hypertension -     Basic metabolic panel with GFR; Future -     TSH; Future -     Urinalysis, Routine w reflex microscopic; Future -     Magnesium ; Future -     Potassium Chloride  ER; Take 1 tablet (10 mEq total) by mouth 2 (two) times daily.  Dispense: 180 tablet; Refill: 0 -     Olmesartan  Medoxomil; Take 1 tablet (20 mg total) by mouth daily.  Dispense: 90 tablet; Refill: 0 -     AMB Referral VBCI Care Management  Prediabetes -     Basic metabolic panel with GFR; Future -     Hemoglobin A1c; Future  GAD (generalized anxiety disorder) -     busPIRone  HCl; Take 2 tablets (20 mg total) by mouth 2 (two) times daily.  Dispense: 360 tablet; Refill: 1 -     TSH; Future -     DULoxetine  HCl; Take 1 capsule (60 mg total) by mouth daily.  Dispense: 90 capsule; Refill: 1  Postherpetic neuralgia -     Gabapentin ; Take 1 capsule (100 mg total) by mouth 3 (three) times daily.  Dispense: 270  capsule; Refill: 1  Menopausal vasomotor syndrome -     TSH; Future -     Luteinizing hormone; Future -     Follicle stimulating hormone; Future  Need for immunization against influenza -     Flu vaccine trivalent PF, 6mos and older(Flulaval,Afluria,Fluarix,Fluzone)  Screening for colon cancer -     Cologuard  Depression with anxiety  Chronic bilateral low back pain without sciatica -     DULoxetine  HCl; Take 1 capsule (60 mg total) by mouth daily.  Dispense: 90 capsule; Refill: 1  Pulmonary nodule -     CT CHEST WO CONTRAST; Future  Type 2 diabetes mellitus without complication, without long-term current use of insulin (HCC) -     Mounjaro; Inject 2.5 mg into the skin once a week.  Dispense: 2 mL; Refill: 0 -     Olmesartan  Medoxomil; Take 1 tablet (20 mg total) by mouth daily.  Dispense: 90 tablet; Refill: 0 -     Rosuvastatin  Calcium ; Take 1 tablet (10 mg total) by mouth daily.  Dispense: 90 tablet; Refill: 0 -     AMB Referral VBCI Care Management -     Ambulatory referral to Ophthalmology  Hypokalemia -     Potassium Chloride  ER; Take 1 tablet (10 mEq total) by mouth 2 (two) times daily.  Dispense: 180 tablet; Refill: 0  Dyslipidemia, goal LDL below 130 -     Rosuvastatin  Calcium ; Take 1 tablet (10 mg total) by mouth daily.  Dispense: 90 tablet; Refill: 0 -     AMB Referral VBCI Care Management     Follow-up: Return in about 6 months (around 02/13/2025).  Debby Molt, MD

## 2024-08-16 LAB — URINALYSIS, ROUTINE W REFLEX MICROSCOPIC
Bilirubin Urine: NEGATIVE
Hgb urine dipstick: NEGATIVE
Ketones, ur: NEGATIVE
Leukocytes,Ua: NEGATIVE
Nitrite: NEGATIVE
Specific Gravity, Urine: 1.01 (ref 1.000–1.030)
Total Protein, Urine: NEGATIVE
Urine Glucose: NEGATIVE
Urobilinogen, UA: 0.2 (ref 0.0–1.0)
pH: 6 (ref 5.0–8.0)

## 2024-08-16 LAB — BASIC METABOLIC PANEL WITH GFR
BUN: 13 mg/dL (ref 6–23)
CO2: 32 meq/L (ref 19–32)
Calcium: 9.8 mg/dL (ref 8.4–10.5)
Chloride: 96 meq/L (ref 96–112)
Creatinine, Ser: 0.59 mg/dL (ref 0.40–1.20)
GFR: 106.38 mL/min (ref >60.00)
Glucose, Bld: 87 mg/dL (ref 70–99)
Potassium: 3.4 meq/L — ABNORMAL LOW (ref 3.5–5.1)
Sodium: 137 meq/L (ref 135–145)

## 2024-08-16 LAB — LUTEINIZING HORMONE: LH: 16.89 m[IU]/mL

## 2024-08-16 LAB — FOLLICLE STIMULATING HORMONE: FSH: 5.4 m[IU]/mL

## 2024-08-16 LAB — MAGNESIUM: Magnesium: 1.8 mg/dL (ref 1.5–2.5)

## 2024-08-16 LAB — TSH: TSH: 1.32 u[IU]/mL (ref 0.35–5.50)

## 2024-08-16 LAB — HEMOGLOBIN A1C: Hgb A1c MFr Bld: 6.5 % (ref 4.6–6.5)

## 2024-08-17 ENCOUNTER — Ambulatory Visit: Payer: Self-pay | Admitting: Internal Medicine

## 2024-08-17 DIAGNOSIS — E119 Type 2 diabetes mellitus without complications: Secondary | ICD-10-CM | POA: Insufficient documentation

## 2024-08-17 MED ORDER — POTASSIUM CHLORIDE ER 10 MEQ PO TBCR: 10.0000 meq | EXTENDED_RELEASE_TABLET | Freq: Two times a day (BID) | ORAL | 0 refills | Status: DC

## 2024-08-17 MED ORDER — MOUNJARO 2.5 MG/0.5ML ~~LOC~~ SOAJ: 2.5000 mg | SUBCUTANEOUS | 0 refills | Status: DC

## 2024-08-17 MED ORDER — OLMESARTAN MEDOXOMIL 20 MG PO TABS: 20.0000 mg | ORAL_TABLET | Freq: Every day | ORAL | 0 refills | Status: DC

## 2024-08-18 MED ORDER — ROSUVASTATIN CALCIUM 10 MG PO TABS: 10.0000 mg | ORAL_TABLET | Freq: Every day | ORAL | 0 refills | Status: DC

## 2024-08-22 ENCOUNTER — Telehealth: Payer: Self-pay | Admitting: *Deleted

## 2024-08-22 NOTE — Progress Notes (Signed)
 Care Guide Pharmacy Note  08/22/2024 Name: Danielle Cook MRN: [id number] DOB: 1976/01/11  Referred By: Joshua Debby CROME, MD Reason for referral: Call Attempt #1 and Complex Care Management (Outreach to schedule referral with pharmacist )   Danielle Cook is a 48 y.o. year old female who is a primary care patient of Joshua Debby CROME, MD.  Danielle Cook was referred to the pharmacist for assistance related to: DMII  An unsuccessful telephone outreach was attempted today to contact the patient who was referred to the pharmacy team for assistance with medication management. Additional attempts will be made to contact the patient.  Thedford Franks, CMA [place]  [facility], [facility] Guide Direct Dial: [phone]  Fax: [phone] Website: [email or web address]

## 2024-08-23 NOTE — Progress Notes (Signed)
 Care Guide Pharmacy Note  08/23/2024 Name: Danielle Cook MRN: [id number] DOB: 1976/09/27  Referred By: Joshua Debby CROME, MD Reason for referral: Call Attempt #1 and Complex Care Management (Outreach to schedule referral with pharmacist )   Danielle Cook is a 48 y.o. year old female who is a primary care patient of Joshua Debby CROME, MD.  Danielle Cook was referred to the pharmacist for assistance related to: DMII  A second unsuccessful telephone outreach was attempted today to contact the patient who was referred to the pharmacy team for assistance with medication management. Additional attempts will be made to contact the patient.  Thedford Franks, CMA [place]  [facility], [facility] Guide Direct Dial: [phone]  Fax: [phone] Website: [email or web address]

## 2024-08-26 NOTE — Progress Notes (Signed)
 Care Guide Pharmacy Note  08/26/2024 Name: Danielle Cook MRN: [id number] DOB: [birth year]  Referred By: Joshua Debby CROME, MD Reason for referral: Call Attempt #1 and Complex Care Management (Outreach to schedule referral with pharmacist )   Danielle Cook is a 48 y.o. year old female who is a primary care patient of Joshua Debby CROME, MD.  Danielle Cook was referred to the pharmacist for assistance related to: HTN and DMII  Successful contact was made with the patient to discuss pharmacy services including being ready for the pharmacist to call at least 5 minutes before the scheduled appointment time and to have medication bottles and any blood pressure readings ready for review. The patient agreed to meet with the pharmacist via telephone visit on 08/29/2024  Thedford Franks, CMA [place]  [facility], [facility] Guide Direct Dial: [phone]  Fax: [phone] Website: [email or web address]

## 2024-08-29 ENCOUNTER — Other Ambulatory Visit (HOSPITAL_COMMUNITY): Payer: Self-pay

## 2024-08-29 ENCOUNTER — Other Ambulatory Visit: Admitting: Pharmacist

## 2024-08-29 DIAGNOSIS — E119 Type 2 diabetes mellitus without complications: Secondary | ICD-10-CM

## 2024-08-29 DIAGNOSIS — E782 Mixed hyperlipidemia: Secondary | ICD-10-CM

## 2024-08-29 DIAGNOSIS — I10 Essential (primary) hypertension: Secondary | ICD-10-CM

## 2024-08-29 NOTE — Progress Notes (Signed)
 08/29/2024 Name: Danielle Cook MRN: [id number] DOB: October 25, 1976  Chief Complaint  Patient presents with   Diabetes   Hypertension   Hyperlipidemia   Medication Management    Danielle Cook is a 48 y.o. year old female who presented for a telephone visit.   They were referred to the pharmacist by their PCP for assistance in managing diabetes, hypertension, and hyperlipidemia/cardiovascular risk reduction.   Subjective:  Care Team: Primary Care Provider: Joshua Debby CROME, MD ; Next Scheduled Visit: 02/14/24   Medication Access/Adherence  Current Pharmacy:  [email or web address] - [facility] - [place], [place] - [address] [place] [address] Phone: [phone] Fax: [phone]   Patient reports affordability concerns with their medications: Yes  Patient reports access/transportation concerns to their pharmacy: No  Patient reports adherence concerns with their medications:  No     Diabetes:  Current medications: none Medications tried in the past: none  Prescribed Mounjaro - this was $822 for 30 DS with insurance and with manufacturer coupon  Current meal patterns: Has lost 5 lbs in the last week - has changed creamer to SF, increased protein, decreased sugar    Macrovascular and Microvascular Risk Reduction:  Statin? no; patient declined statin therapy; ACEi/ARB? yes (olmesartan ) Last urinary albumin/creatinine ratio:   Last eye exam:   Last foot exam: No foot exam found Tobacco Use:  Tobacco Use: Medium Risk (08/15/2024)   Patient History    Smoking Tobacco Use: Former    Smokeless Tobacco Use: Never    Passive Exposure: Not on file   Hypertension:  Current medications: indapamide  1.25 mg daily, olmesartan  20 mg daily Medications previously tried: olmesartan  (hypotension)  She notes BP was lower when CMA checked it and then increased when PCP checked it possibly due to pain from  cuff squeezing arm too tight, per patient report  Patient has a validated, automated, upper arm home BP cuff Current blood pressure readings readings: regularly 120/80 or lower at home, since adding olmesartan  has been 100/60s, most recent 108/62  Patient denies hypotensive s/sx including dizziness, lightheadedness.     Hyperlipidemia/ASCVD Risk Reduction  Current lipid lowering medications: Red yeast rice daily Medications tried in the past: rosuvastatin  (forgetfulness/memory impairment)  Pt declines statin therapy due to concern for risk of dementia, pt's mother has dementia so this is a concern for her. She started red yeast rice about 4-5 months ago and has been working on diet changes.  Antiplatelet regimen: none  ASCVD History: none Family History: father died from MI at 11 yo Risk Factors: LDL 159  CAC score was 0 on 01/19/2024  PREVENT Risk Score:  [email or web address].fr - 10 year risk of CVD: 7.0% - 10 year risk of ASCVD: 4.3% - 10 year risk of HF: 4.4%   Objective:  Lab Results  Component Value Date   HGBA1C 6.5 08/15/2024    Lab Results  Component Value Date   CREATININE 0.59 08/15/2024   BUN 13 08/15/2024   NA 137 08/15/2024   K 3.4 (L) 08/15/2024   CL 96 08/15/2024   CO2 32 08/15/2024    Lab Results  Component Value Date   CHOL 239 (H) 03/19/2024   HDL 37.20 (L) 03/19/2024   LDLCALC 159 (H) 03/19/2024   TRIG 218.0 (H) 03/19/2024   CHOLHDL 6 03/19/2024    Medications Reviewed Today     Reviewed by Merceda Lela SAUNDERS, RPH (Pharmacist) on 08/29/24 at  1637  Med List Status: <None>   Medication Order Taking? Sig Documenting Provider Last Dose Status Informant  aspirin  EC 81 MG tablet [id number]  Take 1 tablet (81 mg total) by mouth daily. Swallow whole. Joshua Debby CROME, MD  Active   augmented betamethasone  dipropionate (DIPROLENE -AF) 0.05 % cream [id number]  Apply 1  application topically 2 (two) times daily. Joshua Debby CROME, MD  Active   busPIRone  (BUSPAR ) 10 MG tablet [id number]  Take 2 tablets (20 mg total) by mouth 2 (two) times daily. Joshua Debby CROME, MD  Active   clonazePAM  (KLONOPIN ) 0.5 MG tablet [id number]  Take 1 tablet (0.5 mg total) by mouth 2 (two) times daily as needed for anxiety. Joshua Debby CROME, MD  Active   DULoxetine  (CYMBALTA ) 60 MG capsule [id number]  Take 1 capsule (60 mg total) by mouth daily. Joshua Debby CROME, MD  Active   gabapentin  (NEURONTIN ) 100 MG capsule [id number]  Take 1 capsule (100 mg total) by mouth 3 (three) times daily. Joshua Debby CROME, MD  Active   indapamide  (LOZOL ) 1.25 MG tablet [id number] Yes TAKE 1 TABLET BY MOUTH DAILY Joshua Debby CROME, MD  Active   levonorgestrel (MIRENA) 20 MCG/24HR IUD [id number]  1 Intra Uterine Device (1 each total) by Intrauterine route once. Joshua Debby CROME, MD  Active Self  meloxicam  (MOBIC ) 15 MG tablet [id number]  TAKE 1 TABLET BY MOUTH DAILY Joshua Debby CROME, MD  Active   Misc Natural Products [facility] ZINC/VIT C/IMMUNE MT) [id number]  Take 1 tablet by mouth daily. [provider]  Active Self  olmesartan  (BENICAR ) 20 MG tablet [id number] Yes Take 1 tablet (20 mg total) by mouth daily. Joshua Debby CROME, MD  Active   potassium chloride  (KLOR-CON ) 10 MEQ tablet [id number] Yes Take 1 tablet (10 mEq total) by mouth 2 (two) times daily. Joshua Debby CROME, MD  Active   Red Yeast Rice Extract (RED YEAST RICE PO) [id number] Yes Take 1 tablet by mouth daily at 6 (six) AM. [provider]  Active   rosuvastatin  (CRESTOR ) 10 MG tablet [id number]  Take 1 tablet (10 mg total) by mouth daily.  Patient not taking: Reported on 08/29/2024   Joshua Debby CROME, MD  Active   tirzepatide [facility]) 2.5 MG/0.5ML Pen [id number]  Inject 2.5 mg into the skin once a week.  Patient not taking: Reported on 08/29/2024   Joshua Debby CROME, MD  Active   triamcinolone  cream (KENALOG ) 0.1 % [id number]  Apply 1 Application  topically 2 (two) times daily. Alvia Corean CROME, FNP  Active   TURMERIC CURCUMIN PO [id number]   [provider]  Active               Assessment/Plan:   Diabetes: - Currently controlled; goal A1c <7%. Cardiorenal risk reduction is opportunities for improvement.. Blood pressure is at goal <130/80. LDL is not at goal.  - Reviewed long term cardiovascular and renal outcomes of uncontrolled blood sugar. and Reviewed dietary modifications including increased protein and fiber. - Ran test claim for Mounjaro was $840 for 28 DS. Ozempic was $914 for 28 DS - insurance not covering, pt may have a deductible for higher tier meds or her plan just does not cover - Discussed options for [employer] or [facility] pharmacies but price is still $500 per month - Consider oral Wegovy in near future, should be cheaper through direct pharmacy option.  Hypertension: - Currently controlled, BP goal <130/80. Home BP have been well controlled at home with indapamide   alone. Elevated reading in office appears to be one time reading. Now Bps have been too low at home on olmesartan .  - Recommend to stop olmesartan . Continue indapamide .  - Recheck BMP for potassium level   Hyperlipidemia/ASCVD Risk Reduction: - Currently uncontrolled. LDL goal <70 given T2DM diagnosis - Reviewed long term complications of uncontrolled cholesterol - Reviewed benefits of statin versus possible adverse effects. Only small studies have shown possible cognitive impairment effects and overall benefit of statin is still thought to be greater than risk.  - Recommend to recheck lipid panel to get updated LDL since being on red yeast rice    Follow Up Plan: Walk-in lab next week  Darrelyn Drum, PharmD, BCPS, CPP Clinical Pharmacist Practitioner [facility] [phone]

## 2024-08-30 NOTE — Patient Instructions (Signed)
 It was a pleasure speaking with you today!  STOP olmesartan  Continue indapamide  Continue monitoring BP at home. Goal BP <130/80.  Come by the lab for fasting blood work to check cholesterol and potassium level within the next week.  Feel free to call with any questions or concerns!  Darrelyn Drum, PharmD, BCPS, CPP Clinical Pharmacist Practitioner [place] [facility] at [facility] [phone]

## 2024-09-04 ENCOUNTER — Ambulatory Visit: Admitting: Internal Medicine

## 2024-09-05 ENCOUNTER — Encounter: Payer: Self-pay | Admitting: Internal Medicine

## 2024-09-06 ENCOUNTER — Other Ambulatory Visit: Payer: Self-pay | Admitting: Internal Medicine

## 2024-09-06 DIAGNOSIS — E119 Type 2 diabetes mellitus without complications: Secondary | ICD-10-CM

## 2024-09-06 MED ORDER — OZEMPIC (0.25 OR 0.5 MG/DOSE) 2 MG/3ML ~~LOC~~ SOPN: 0.2500 mg | PEN_INJECTOR | SUBCUTANEOUS | 0 refills | Status: AC

## 2024-09-06 MED ORDER — PHENTERMINE HCL 37.5 MG PO TABS: 37.5000 mg | ORAL_TABLET | Freq: Every day | ORAL | 0 refills | Status: DC

## 2024-09-06 MED ORDER — INSULIN PEN NEEDLE 32G X 6 MM MISC: 1.0000 | 1 refills | Status: AC

## 2024-11-02 ENCOUNTER — Telehealth: Admitting: Family Medicine

## 2024-11-02 ENCOUNTER — Telehealth: Admitting: Nurse Practitioner

## 2024-11-02 DIAGNOSIS — J069 Acute upper respiratory infection, unspecified: Secondary | ICD-10-CM

## 2024-11-02 MED ORDER — PREDNISONE 20 MG PO TABS: 20.0000 mg | ORAL_TABLET | Freq: Two times a day (BID) | ORAL | 0 refills | Status: DC

## 2024-11-02 MED ORDER — PREDNISONE 20 MG PO TABS: 20.0000 mg | ORAL_TABLET | Freq: Two times a day (BID) | ORAL | 0 refills | Status: AC

## 2024-11-02 MED ORDER — AZITHROMYCIN 250 MG PO TABS: ORAL_TABLET | ORAL | 0 refills | Status: DC

## 2024-11-02 MED ORDER — AZITHROMYCIN 250 MG PO TABS: ORAL_TABLET | ORAL | 0 refills | Status: AC

## 2024-11-02 MED ORDER — BENZONATATE 200 MG PO CAPS: 200.0000 mg | ORAL_CAPSULE | Freq: Three times a day (TID) | ORAL | 0 refills | Status: AC | PRN

## 2024-11-02 MED ORDER — BENZONATATE 200 MG PO CAPS: 200.0000 mg | ORAL_CAPSULE | Freq: Three times a day (TID) | ORAL | 0 refills | Status: DC | PRN

## 2024-11-02 NOTE — Progress Notes (Signed)
 " Virtual Visit Consent   Danielle Cook, you are scheduled for a virtual visit with a [facility] provider today. Just as with appointments in the office, your consent must be obtained to participate. Your consent will be active for this visit and any virtual visit you may have with one of our providers in the next 365 days. If you have a MyChart account, a copy of this consent can be sent to you electronically.  As this is a virtual visit, video technology does not allow for your provider to perform a traditional examination. This may limit your provider's ability to fully assess your condition. If your provider identifies any concerns that need to be evaluated in person or the need to arrange testing (such as labs, EKG, etc.), we will make arrangements to do so. Although advances in technology are sophisticated, we cannot ensure that it will always work on either your end or our end. If the connection with a video visit is poor, the visit may have to be switched to a telephone visit. With either a video or telephone visit, we are not always able to ensure that we have a secure connection.  By engaging in this virtual visit, you consent to the provision of healthcare and authorize for your insurance to be billed (if applicable) for the services provided during this visit. Depending on your insurance coverage, you may receive a charge related to this service.  I need to obtain your verbal consent now. Are you willing to proceed with your visit today? Danielle Cook has provided verbal consent on 11/02/2024 for a virtual visit (video or telephone). Danielle Lamp, FNP  Date: 11/02/2024 10:30 AM   Virtual Visit via Video Note   I, Danielle Cook, connected with  Danielle Cook  ([id number], Jul 20, 1976) on 11/02/2024 at 10:30 AM EST by a video-enabled telemedicine application and verified that I am speaking with the correct person using two identifiers.  Location: Patient: Virtual Visit Location Patient:  Home Provider: Virtual Visit Location Provider: Home Office   I discussed the limitations of evaluation and management by telemedicine and the availability of in person appointments. The patient expressed understanding and agreed to proceed.    History of Present Illness: Danielle Cook is a 49 y.o. who identifies as a female who was assigned female at birth, and is being seen today for barking cough for 2 week chest tight, son sick from school, no wheezing or sob, Green brown mucus. In no distress.   HPI: HPI  Problems:  Patient Active Problem List   Diagnosis Date Noted   Type 2 diabetes mellitus without complication, without long-term current use of insulin  (HCC) 08/17/2024   Menopausal vasomotor syndrome 08/15/2024   Prediabetes 08/15/2024   Need for immunization against influenza 08/15/2024   Herpes zoster with complication 03/18/2024   Postherpetic neuralgia 03/18/2024   Pulmonary nodule 03/18/2024   Abnormal findings on diagnostic imaging of lung 03/18/2024   GAD (generalized anxiety disorder) 12/14/2023   Witnessed episode of apnea 12/14/2023   Screening-pulmonary TB 10/16/2023   High serum lipoprotein(a) 06/04/2023   Dyslipidemia, goal LDL below 130 06/01/2023   Morbid obesity (HCC) 12/01/2022   Screening for colon cancer 12/01/2022   Moderate episode of recurrent major depressive disorder (HCC) 01/06/2021   Greater trochanteric bursitis of both hips 01/06/2021   Low back pain 01/06/2021   Primary hypertension 08/24/2020   Right carpal tunnel syndrome 12/06/2019   Xerosis cutis 10/16/2019   Nicotine vapor product user  08/09/2018   Visit for screening mammogram 12/13/2016   Family history of premature CAD 12/13/2016   Radiculitis of left cervical region 03/23/2016   PPD positive, treated 03/22/2013   Routine general medical examination at a health care facility 03/22/2013   Allergic rhinitis 03/22/2013   Depression with anxiety 10/31/2011    Allergies:  Allergies[1] Medications: Current Medications[2]  Observations/Objective: Patient is well-developed, well-nourished in no acute distress.  Resting comfortably  at home.  Head is normocephalic, atraumatic.  No labored breathing.  Speech is clear and coherent with logical content.  Patient is alert and oriented at baseline.    Assessment and Plan: There are no diagnoses linked to this encounter.   Follow Up Instructions: I discussed the assessment and treatment plan with the patient. The patient was provided an opportunity to ask questions and all were answered. The patient agreed with the plan and demonstrated an understanding of the instructions.  A copy of instructions were sent to the patient via MyChart unless otherwise noted below.     The patient was advised to call back or seek an in-person evaluation if the symptoms worsen or if the condition fails to improve as anticipated.    Danielle Likins, FNP     [1]  Allergies Allergen Reactions   Egg Protein-Containing Drug Products     Sensitivity    Guaifenesin Er Anxiety and Palpitations   Latex Hives and Rash   Wellbutrin [Bupropion Hcl] Anxiety and Palpitations  [2]  Current Outpatient Medications:    aspirin  EC 81 MG tablet, Take 1 tablet (81 mg total) by mouth daily. Swallow whole., Disp: 90 tablet, Rfl: 1   augmented betamethasone  dipropionate (DIPROLENE -AF) 0.05 % cream, Apply 1 application topically 2 (two) times daily., Disp: 30 g, Rfl: 1   busPIRone  (BUSPAR ) 10 MG tablet, Take 2 tablets (20 mg total) by mouth 2 (two) times daily., Disp: 360 tablet, Rfl: 1   clonazePAM  (KLONOPIN ) 0.5 MG tablet, Take 1 tablet (0.5 mg total) by mouth 2 (two) times daily as needed for anxiety., Disp: 180 tablet, Rfl: 0   DULoxetine  (CYMBALTA ) 60 MG capsule, Take 1 capsule (60 mg total) by mouth daily., Disp: 90 capsule, Rfl: 1   gabapentin  (NEURONTIN ) 100 MG capsule, Take 1 capsule (100 mg total) by mouth 3 (three) times daily., Disp: 270  capsule, Rfl: 1   indapamide  (LOZOL ) 1.25 MG tablet, TAKE 1 TABLET BY MOUTH DAILY, Disp: 90 tablet, Rfl: 3   Insulin  Pen Needle 32G X 6 MM MISC, 1 Act by Does not apply route once a week., Disp: 50 each, Rfl: 1   levonorgestrel (MIRENA) 20 MCG/24HR IUD, 1 Intra Uterine Device (1 each total) by Intrauterine route once., Disp: 1 each, Rfl: 0   meloxicam  (MOBIC ) 15 MG tablet, TAKE 1 TABLET BY MOUTH DAILY, Disp: 90 tablet, Rfl: 3   Misc Natural Products (ELDERBERRY ZINC/VIT C/IMMUNE MT), Take 1 tablet by mouth daily., Disp: , Rfl:    potassium chloride  (KLOR-CON ) 10 MEQ tablet, Take 1 tablet (10 mEq total) by mouth 2 (two) times daily., Disp: 180 tablet, Rfl: 0   Red Yeast Rice Extract (RED YEAST RICE PO), Take 1 tablet by mouth daily at 6 (six) AM., Disp: , Rfl:    Semaglutide ,0.25 or 0.5MG /DOS, (OZEMPIC , 0.25 OR 0.5 MG/DOSE,) 2 MG/3ML SOPN, Inject 0.25 mg into the skin once a week., Disp: 3 mL, Rfl: 0   triamcinolone  cream (KENALOG ) 0.1 %, Apply 1 Application topically 2 (two) times daily., Disp: 30 g, Rfl:  0   TURMERIC CURCUMIN PO, , Disp: , Rfl:   "

## 2024-11-02 NOTE — Progress Notes (Signed)
"  Sent to alternate pharmacy   "

## 2024-11-02 NOTE — Patient Instructions (Signed)

## 2024-11-03 ENCOUNTER — Telehealth

## 2024-11-08 ENCOUNTER — Other Ambulatory Visit: Payer: Self-pay | Admitting: Internal Medicine

## 2024-11-08 DIAGNOSIS — I10 Essential (primary) hypertension: Secondary | ICD-10-CM

## 2024-11-08 DIAGNOSIS — E876 Hypokalemia: Secondary | ICD-10-CM

## 2024-11-29 LAB — OPHTHALMOLOGY REPORT-SCANNED

## 2024-12-03 LAB — OPHTHALMOLOGY REPORT-SCANNED

## 2025-02-13 ENCOUNTER — Ambulatory Visit: Admitting: Internal Medicine
